# Patient Record
Sex: Male | Born: 1960 | Race: White | Hispanic: No | Marital: Married | Smoking: Current every day smoker
Health system: Southern US, Community
[De-identification: ages and names within clinical notes are randomized; demographics above are authoritative.]

## PROBLEM LIST (undated history)

## (undated) DIAGNOSIS — M1990 Unspecified osteoarthritis, unspecified site: Secondary | ICD-10-CM

## (undated) DIAGNOSIS — G4733 Obstructive sleep apnea (adult) (pediatric): Secondary | ICD-10-CM

## (undated) DIAGNOSIS — F329 Major depressive disorder, single episode, unspecified: Secondary | ICD-10-CM

## (undated) DIAGNOSIS — R9431 Abnormal electrocardiogram [ECG] [EKG]: Secondary | ICD-10-CM

## (undated) DIAGNOSIS — F419 Anxiety disorder, unspecified: Secondary | ICD-10-CM

## (undated) DIAGNOSIS — N138 Other obstructive and reflux uropathy: Secondary | ICD-10-CM

## (undated) DIAGNOSIS — G4700 Insomnia, unspecified: Secondary | ICD-10-CM

## (undated) DIAGNOSIS — E876 Hypokalemia: Secondary | ICD-10-CM

## (undated) DIAGNOSIS — D649 Anemia, unspecified: Secondary | ICD-10-CM

## (undated) DIAGNOSIS — M6282 Rhabdomyolysis: Secondary | ICD-10-CM

## (undated) DIAGNOSIS — E669 Obesity, unspecified: Secondary | ICD-10-CM

## (undated) DIAGNOSIS — R3989 Other symptoms and signs involving the genitourinary system: Secondary | ICD-10-CM

## (undated) DIAGNOSIS — Z8781 Personal history of (healed) traumatic fracture: Secondary | ICD-10-CM

## (undated) DIAGNOSIS — F32A Depression, unspecified: Secondary | ICD-10-CM

## (undated) DIAGNOSIS — E569 Vitamin deficiency, unspecified: Secondary | ICD-10-CM

## (undated) DIAGNOSIS — R0789 Other chest pain: Secondary | ICD-10-CM

## (undated) DIAGNOSIS — J189 Pneumonia, unspecified organism: Secondary | ICD-10-CM

## (undated) DIAGNOSIS — K1120 Sialoadenitis, unspecified: Secondary | ICD-10-CM

## (undated) DIAGNOSIS — N401 Enlarged prostate with lower urinary tract symptoms: Secondary | ICD-10-CM

## (undated) DIAGNOSIS — F319 Bipolar disorder, unspecified: Secondary | ICD-10-CM

## (undated) DIAGNOSIS — F17200 Nicotine dependence, unspecified, uncomplicated: Secondary | ICD-10-CM

## (undated) DIAGNOSIS — S0291XA Unspecified fracture of skull, initial encounter for closed fracture: Secondary | ICD-10-CM

## (undated) DIAGNOSIS — K115 Sialolithiasis: Secondary | ICD-10-CM

## (undated) HISTORY — PX: COLONOSCOPY: SHX174

## (undated) HISTORY — DX: Nicotine dependence, unspecified, uncomplicated: F17.200

## (undated) HISTORY — PX: OTHER SURGICAL HISTORY: SHX169

## (undated) HISTORY — DX: Benign prostatic hyperplasia with lower urinary tract symptoms: N13.8

## (undated) HISTORY — DX: Sialoadenitis, unspecified: K11.20

## (undated) HISTORY — DX: Unspecified osteoarthritis, unspecified site: M19.90

## (undated) HISTORY — PX: GASTRIC BYPASS: SHX52

## (undated) HISTORY — PX: APPENDECTOMY: SHX54

## (undated) HISTORY — DX: Other symptoms and signs involving the genitourinary system: R39.89

## (undated) HISTORY — DX: Bipolar disorder, unspecified: F31.9

## (undated) HISTORY — DX: Insomnia, unspecified: G47.00

## (undated) HISTORY — DX: Hypokalemia: E87.6

## (undated) HISTORY — DX: Other chest pain: R07.89

## (undated) HISTORY — DX: Obesity, unspecified: E66.9

## (undated) HISTORY — DX: Sialolithiasis: K11.5

## (undated) HISTORY — DX: Anemia, unspecified: D64.9

## (undated) HISTORY — DX: Obstructive sleep apnea (adult) (pediatric): G47.33

## (undated) HISTORY — DX: Benign prostatic hyperplasia with lower urinary tract symptoms: N40.1

## (undated) HISTORY — PX: TONSILLECTOMY AND ADENOIDECTOMY: SUR1326

## (undated) HISTORY — DX: Vitamin deficiency, unspecified: E56.9

---

## 1993-09-08 HISTORY — PX: KNEE ARTHROSCOPY: SUR90

## 1998-02-08 ENCOUNTER — Observation Stay (HOSPITAL_COMMUNITY): Admission: RE | Admit: 1998-02-08 | Discharge: 1998-02-09 | Payer: Self-pay | Admitting: Specialist

## 1998-11-30 ENCOUNTER — Encounter: Payer: Self-pay | Admitting: Internal Medicine

## 1998-11-30 ENCOUNTER — Ambulatory Visit (HOSPITAL_COMMUNITY): Admission: RE | Admit: 1998-11-30 | Discharge: 1998-11-30 | Payer: Self-pay | Admitting: Internal Medicine

## 2000-09-15 ENCOUNTER — Encounter: Payer: Self-pay | Admitting: Emergency Medicine

## 2000-09-15 ENCOUNTER — Encounter: Admission: RE | Admit: 2000-09-15 | Discharge: 2000-09-15 | Payer: Self-pay | Admitting: Emergency Medicine

## 2001-02-11 ENCOUNTER — Encounter: Admission: RE | Admit: 2001-02-11 | Discharge: 2001-02-11 | Payer: Self-pay | Admitting: Emergency Medicine

## 2001-02-11 ENCOUNTER — Encounter: Payer: Self-pay | Admitting: Emergency Medicine

## 2001-03-22 ENCOUNTER — Encounter: Payer: Self-pay | Admitting: Emergency Medicine

## 2001-03-22 ENCOUNTER — Encounter: Admission: RE | Admit: 2001-03-22 | Discharge: 2001-03-22 | Payer: Self-pay | Admitting: Emergency Medicine

## 2004-09-08 HISTORY — PX: GASTRIC BYPASS: SHX52

## 2008-03-27 ENCOUNTER — Other Ambulatory Visit (HOSPITAL_COMMUNITY): Admission: RE | Admit: 2008-03-27 | Discharge: 2008-03-27 | Payer: Self-pay | Admitting: Psychiatry

## 2008-04-18 ENCOUNTER — Emergency Department (HOSPITAL_BASED_OUTPATIENT_CLINIC_OR_DEPARTMENT_OTHER): Admission: EM | Admit: 2008-04-18 | Discharge: 2008-04-19 | Payer: Self-pay | Admitting: Emergency Medicine

## 2008-07-05 ENCOUNTER — Inpatient Hospital Stay (HOSPITAL_COMMUNITY): Admission: RE | Admit: 2008-07-05 | Discharge: 2008-07-08 | Payer: Self-pay | Admitting: Specialist

## 2008-08-15 ENCOUNTER — Inpatient Hospital Stay (HOSPITAL_COMMUNITY): Admission: RE | Admit: 2008-08-15 | Discharge: 2008-08-18 | Payer: Self-pay | Admitting: Specialist

## 2008-09-12 ENCOUNTER — Encounter: Admission: RE | Admit: 2008-09-12 | Discharge: 2008-10-04 | Payer: Self-pay | Admitting: Specialist

## 2010-05-14 ENCOUNTER — Emergency Department (HOSPITAL_COMMUNITY): Admission: EM | Admit: 2010-05-14 | Discharge: 2010-05-14 | Payer: Self-pay | Admitting: Emergency Medicine

## 2011-01-21 NOTE — Op Note (Signed)
NAME:  Alvin Allen, Alvin Allen NO.:  [id number]   MEDICAL RECORD NO.:  [id number]          PATIENT TYPE:  INP   LOCATION:  0001                         FACILITY:  [facility]   PHYSICIAN:  Erasmo Leventhal, M.D.DATE OF BIRTH:  1960-12-09   DATE OF PROCEDURE:  07/05/2008  DATE OF DISCHARGE:                               OPERATIVE REPORT   PREOPERATIVE DIAGNOSIS:  Right knee end-stage osteoarthritis.   POSTOPERATIVE DIAGNOSIS:  Right knee end-stage osteoarthritis.   PROCEDURE:  Right total knee arthroplasty.   SURGEON:  Erasmo Leventhal, M.D.   ASSISTANT:  Jaquelyn Bitter. Chabon, P.A.   ANESTHESIA:  Spinal with monitored anesthesia care.   ESTIMATED BLOOD LOSS:  Less than 50 mL.   DRAINS:  One medium Hemovac.   COMPLICATIONS:  None.   TOURNIQUET TIME:  Was 80 minutes at 300 mmHg.   DISPOSITION:  PACU stable.   OPERATIVE DETAILS:  The patient was counseled in the holding area after  correct side was identified.  Taken to the operating room where spinal  anesthetic was administered.  IV antibiotics were given.  Foley catheter  placed utilizing sterile technique by the OR circulating nurse.  All  extremities well-padded and bumped.  Right knee showed a 5 degree  flexion contracture and he could flex to 120 degrees.  He was elevated.  He was prepped with DuraPrep and draped in sterile fashion.  Exsanguinated with the Esmarch and tourniquet was inflated to 300 mmHg.  Straight midline incision made through skin and subcutaneous tissue.  Medial soft tissue flaps were developed at appropriate level.  Medial  parapatellar arthrotomy was performed.  Patella was retracted out of the  way.  Knee was hyperflexed with end-stage arthritis changes.  The  cruciate ligaments were resected.  Starting hole made in distal femur  and canal was irrigated.  The effluent was clear.  Intramedullary rod  was gently placed.  I chose a 5 degree valgus cut and took a 10 mm cut  off distal  femur.  The extremity was found to be a size #5.  Rotation  marks were made and set.  Femoral box cut was applied.  Medial and  lateral menisci removed under direct visualization.  Geniculate vessels  were coagulated.  Posterior neurovascular structures were followed and  protected throughout the entire case.  Again utilizing extramedullary  rod on the tibia it was set at appropriate angle and level in all  planes.  We chose a 10 mm cut off the least deficient side which was the  lateral side at a 0 degree slope.  Proximal tibia was found to be a size  #5.  Posteromedial, posterior femoral osteophytes were removed under  direct visualization.  With flexion/extension blocks the 10 was well-  balanced.   Tibia was exposed.  Guide was applied.  Coverage was set.  Reamer punch  was performed.  Femoral box cut was then performed.  At this time with a  size 5 tibia, size 5 femur, 10 insert and well-balanced in  flexion/extension.  Patella was found to be a size 38 and appropriate  amount of  bone was resected.  Locking holes were made with a patellar  button.  We had anatomic patellofemoral tracking.  All trials removed.  The knee was irrigated with pulsatile lavage.  Utilizing modern cement  technique all components were cemented in place, size 5 tibia, size 5  femur, 10 insert, trial insert and a 38 patella.  Once cement had cured  excess cement was removed.  We did a trial of a 10/12.5 insert.  With  the 12.5 insert we had well-balanced flexion/extension gaps, stable to  varus and valgus stress, patellofemoral tract was anatomic.  Trials were  removed.  The knee was irrigated with pulsatile lavage.  A final 12.5  posterior stabilized rotating platform tibial insert was implanted.  All  components were irrigated again.  A medium Hemovac drain was placed.  A  sequential closure of layers done at 90 degrees of flexion.  Subcu  closed with Vicryl and skin with a subcuticular Monocryl suture.   Steri-  Strips were applied.  Drains hooked to suction.  Sterile dressing was  applied.  Tourniquet was deflated.  The patient tolerated the procedure  well.  No complications or problems.  At this point in time he is taking  from the operating room to the PACU in stable condition.  Sponge and  needle count correct.   To help with surgical technique and decision making Mr. Jodene Nam,  P.A.C. assisted throughout the entire care.           ______________________________  Erasmo Leventhal, M.D.     RAC/MEDQ  D:  07/05/2008  T:  07/05/2008  Job:  [id number]

## 2011-01-21 NOTE — H&P (Signed)
NAME:  Alvin Allen, WAHLSTROM NO.:  [id number]   MEDICAL RECORD NO.:  [id number]          PATIENT TYPE:  INP   LOCATION:                               FACILITY:  [facility]   PHYSICIAN:  Collins                DATE OF BIRTH:  [birth year]   DATE OF ADMISSION:  06/08/2008  DATE OF DISCHARGE:                              HISTORY & PHYSICAL   CHIEF COMPLAINT:  End-stage osteoarthritis right knee.   HISTORY OF PRESENT ILLNESS:  This is a 50 year old gentleman with a  history of end-stage osteoarthritis of his right knee with failure of  conservative treatment to alleviate his pain and discomfort.  After  discussion of treatment benefits, risks and options, and medical  clearance by his medical doctor, surgery is to proceed for total knee  arthroplasty of his knee.  His medical doctor is in [facility], so if we  need medical consultation during hospitalization, we will use [facility]  hospitalists.  His psychiatrist is Dr. Donell Beers here in town.   PAST MEDICAL HISTORY:  Drug allergy to TERGITOL.   CURRENT MEDICATIONS:  1. Seroquel 100 mg q.i.d.  2. Abilify 10 mg daily.  3. Chantix 1 mg b.i.d.  4. Inderal 10 mg daily.  5. Ativan 0.5 mg b.i.d. p.r.n.  6. Lunesta 3 mg or Ambien 10 mg nightly p.r.n.  7. Lamictal 200 mg nightly.   SERIOUS MEDICAL ILLNESSES:  Include bipolar disease and previous  surgeries include bariatric surgery and left knee arthroscopic surgery.   FAMILY HISTORY:  Negative.   SOCIAL HISTORY:  The patient is married, works as an Clinical cytogeneticist.  He  smokes 1 pack per day and drinks about 2 drinks per day.   REVIEW OF SYSTEMS:  CENTRAL NERVOUS SYSTEM:  Positive for bipolar and  anxiety.  PULMONARY:  Positive for history of sleep apnea when the  patient was obese but he is now off of the CPAP machine, and does not  use it any more.  He has a history of asthma.  CARDIOVASCULAR:  Negative  for chest pain, palpitation.  GI: Negative for ulcers, hepatitis.  Positive  for bariatric surgery.  GU: Positive for enlarged prostate.  MUSCULOSKELETAL:  Positive in HPI.   PHYSICAL EXAM:  VITAL SIGNS:  BP 130/88, respirations 16, pulse 68 and  regular.  GENERAL APPEARANCE:  This well-developed, well-nourished gentleman in no  acute distress.  HEENT:  Head normocephalic.  Nose patent.  Ears patent.  Pupils equal,  round, react to light.  Throat without injection.  NECK:  Supple without adenopathy.  Carotids 2+ without bruit.  CHEST:  Clear to auscultation.  No rales or rhonchi.  Respirations 16.  HEART:  Regular rate and rhythm at 68 beats per without murmur.  ABDOMEN:  Soft with active bowel sounds.  No masses, organomegaly.  NEUROLOGIC:  Patient alert and oriented to time, place and person.  Cranial nerves II-XII grossly intact.  EXTREMITIES:  Shows the right knee with a varus deformity.  Neurovascular status intact.  Dorsalis pedis and posterior tibialis  pulses are 2+.  He has 5 degrees flexion contracture with further  flexion to 120 degrees.   X-rays show end-stage osteoarthritis right knee.   IMPRESSION:  End-stage osteoarthritis right knee.   PLAN:  Total knee arthroplasty right knee.      Jaquelyn Bitter. Chabon, P.A.    ______________________________  Thomasena Edis    SJC/MEDQ  D:  05/29/2008  T:  05/30/2008  Job:  [id number]

## 2011-01-21 NOTE — Discharge Summary (Signed)
NAME:  Alvin Allen, Alvin Allen NO.:  [id number]   MEDICAL RECORD NO.:  [id number]          PATIENT TYPE:  INP   LOCATION:  1611                         FACILITY:  [facility]   PHYSICIAN:  Erasmo Leventhal, M.D.DATE OF BIRTH:  1960-12-02   DATE OF ADMISSION:  07/05/2008  DATE OF DISCHARGE:  07/08/2008                               DISCHARGE SUMMARY   ADMITTING DIAGNOSIS:  End-stage osteoarthritis, right knee.   DISCHARGE DIAGNOSIS:  Same.   OPERATION:  Total knee arthroplasty, right knee.   BRIEF HISTORY:  This is a 50 year old gentleman with history of end-  stage osteoarthritis of bilateral knees, right greater left, who is now  undergoing total knee arthroplasty of his right knee.  The surgery  risks, benefits and aftercare were discussed in detail with the patient,  questions invited and answered.  He has had medical clearance by his  medical doctor and psychiatrist and surgical will go ahead as scheduled.   LABORATORY VALUES:  Admission CBC showed the RBCs slightly low at 4.1,  MCV slightly high at 101.4, hemoglobin/hematocrit reached a low of 11.4  and 33 on October 31, PT/PTT within normal, his admission CMET within  normal limits, had a slightly low potassium on October 31 at 3.2.  Glucose was high on the 29th at 121, otherwise normal.  Urinalysis  within normal limits.  EKG showed sinus bradycardia and an incomplete  right bundle branch block.   COURSE IN THE HOSPITAL:  The patient tolerated the operative procedure  well.  First postoperative day vital signs stable, temperature to 100, I  and Os good, hemoglobin and hematocrit stable, lungs clear, heart sounds  normal, bowel sounds active, calves were negative.  Drain was removed  without difficulty.  Physical therapy and CPM were started.  Second  postoperative day vital signs stable, temperature to a max of 99.5, O2  98 on room air, hemoglobin 11.7, hematocrit 33.8, lungs clear, heart  sounds normal, bowel  sounds sluggish, calves negative and wound was  benign and plans were made for discharge in the a.m. as labs and patient  doing well.  Third postoperative day vital signs were stable, he was  afebrile, hemoglobin of 11.4, lungs clear, heart sounds normal, dressing  was changed, wound was benign.  The patient subsequently discharged home  for follow-up in the office.   CONDITION ON DISCHARGE:  Improved.   DISCHARGE MEDICATIONS:  1. Oxycodone 5 mg one to two q.6 h p.r.n. pain.  2. Robaxin 500 one p.o. q.8 h p.r.n. spasm.  3. Lovenox 30 mg one shot each day at 7:00 a.m. and 7:00 p.m., when      Lovenox runs out aspirin 81 mg one a day for 3 weeks.   DISCHARGE INSTRUCTIONS:  He is to use his crutches, do his therapy,  weightbear as tolerated with his brace.  Return to the office in 2 weeks  for recheck or sooner p.r.n. problem.   DISCHARGE DIET:  Regular diet.      Jaquelyn Bitter. Chabon, P.A.    ______________________________  Erasmo Leventhal, M.D.    SJC/MEDQ  D:  08/09/2008  T:  08/09/2008  Job:  [id number]

## 2011-01-21 NOTE — Discharge Summary (Signed)
NAME:  JAMESON, TORMEY NO.:  [id number]   MEDICAL RECORD NO.:  [id number]          PATIENT TYPE:  INP   LOCATION:  1606                         FACILITY:  [facility]   PHYSICIAN:  Erasmo Leventhal, M.D.DATE OF BIRTH:  08-27-1961   DATE OF ADMISSION:  08/15/2008  DATE OF DISCHARGE:  08/18/2008                               DISCHARGE SUMMARY   ADMISSION DIAGNOSIS:  End-stage osteoarthritis left knee.   DISCHARGE DIAGNOSIS:  Same.   OPERATION:  Total knee arthroplasty left knee.   BRIEF HISTORY:  This is a 50 year old gentleman with history of obesity  in the past with massive weight loss with bariatric surgery who had end-  stage osteoarthritis of both knees.  He initially had the right knee  done several months ago and did well and now in for total knee  arthroplasty of the left knee due to failure of conservative treatment  to alleviate his pain and difficulties.  The surgery risks, benefits and  aftercare were again discussed in detail with the patient, questions  invited and answered.  Surgery will go ahead as scheduled.   LABORATORY VALUES:  Admission CBC within normal limits with exception of  slightly high neutrophils at 79.  Admission PT/PTT within normal limits.  Admission urinalysis within normal limits.  Admission CMET within normal  limits.  Hemoglobin/hematocrit reached a low of 11.2 and 33.5 on the  11th.  BMET showed mildly elevated glucose in the 118 - 108 range  through admission.  Potassium on the 11th - 3.4.   COURSE IN THE HOSPITAL:  The patient tolerated the operative procedure  well.  First postoperative day vital signs were stable, he is afebrile  though temperature to a max through the night of 100.6, lungs clear,  heart sounds normal, bowel sounds active, calves negative, dressing was  dry, drain was removed and he was started in therapy.  Second  postoperative day vital signs were stable, was afebrile, I and Os were  good, lungs clear,  heart sounds normal, bowel sounds active, calves  negative, dressing was changed, wound benign and plans were made for  discharge on Friday.  Friday his vital signs stable, he was afebrile, O2  - 98 on room air, hemoglobin 11.2, hematocrit 33.5, lungs clear, heart  sounds normal, bowel sounds active, calves negative, wound benign.  The  patient was subsequently discharged home for follow-up in the office.   CONDITION ON DISCHARGE:  Improved.   DISCHARGE MEDICATIONS:  1. Oxycodone 5 mg one to two q.4 - 6 hours p.r.n. pain.  2. Robaxin 500 one p.o. q.8 hours p.r.n. spasm.  3. Lovenox 30 mg one shot at 7:00 a.m. and 7:00 p.m. for another 10      days and then switch to 181 mg aspirin per day for 3 weeks.  4. Iron 325 mg one p.o. b.i.d.   DISCHARGE INSTRUCTIONS:  He is keep the wound clean and dry for 3 weeks.  He is to do his home therapy, call the office today for follow-up in 2  weeks or call sooner p.r.n. problems.   DIET:  Regular diet.  Jaquelyn Bitter. Chabon, P.A.    ______________________________  Erasmo Leventhal, M.D.    SJC/MEDQ  D:  08/18/2008  T:  08/18/2008  Job:  [id number]

## 2011-01-21 NOTE — Op Note (Signed)
NAME:  Alvin Allen, Alvin Allen NO.:  [id number]   MEDICAL RECORD NO.:  [id number]          PATIENT TYPE:  INP   LOCATION:  0002                         FACILITY:  [facility]   PHYSICIAN:  Erasmo Leventhal, M.D.DATE OF BIRTH:  Dec 12, 1960   DATE OF PROCEDURE:  08/15/2008  DATE OF DISCHARGE:                               OPERATIVE REPORT   PREOPERATIVE DIAGNOSES:  Left knee end-stage arthritis.   POSTOPERATIVE DIAGNOSES:  Left knee end-stage arthritis.   PROCEDURE:  Left total knee arthroplasty.   SURGEON:  Erasmo Leventhal, M.D.   ASSISTANT:  Jaquelyn Bitter. Chabon, P.A.   ANESTHESIA:  Spinal.   BLOOD LOSS:  Less than 100 mL.   DRAINS:  One Hemovac.   COMPLICATIONS:  None.   TOURNIQUET TIME:  85 minutes at 300 mmHg.   DISPOSITION:  PACU stable.   OPERATIVE IMPLANTS:  [employer].  Femur size 5, tibia  size 5, patella 38.  The 10 mm posterior stabilized rotating platform  insert.  All cemented.   DESCRIPTION OF PROCEDURE:  The patient was counseled in the holding area  and correct side identified.  IV started and skin block administered.  Taken to operating room, placed in a supine position.  Then spinal  anesthetic was administered.  Foley catheter placed utilizing sterile  technique by OR circulating nurse after IV antibiotics were given.  All  extremities were well padded and bumped.  The left knee was examined, 5  degree flexion contraction, flexion to 120 degrees.  He was elevated,  prepped with DuraPrep and draped in a sterile fashion, exsanguinated  with Esmarch and tourniquet inflated to 300 mmHg.  Straight midline  incision made in the skin and subcutaneous tissue.  Medial soft tissue  flaps developed.  Medial parapatellar arthrotomy was performed.  Soft  tissue release was done.  There was bone against bone contact of all  compartments.  The cruciate ligament was resected.  At start hole made  in the distal femur, canal was  irrigated, inflow was clear.  Intramedullary rod was gently placed.  Took a 10-mm cup distal femur and  5 degrees valgus cut.  This was femur was found to be a size #5.  Cutting block was applied, cut to fit a size five.  The medial and  lateral menisci removed under direct visualization.  Geniculate vessels  were coagulated.  Posterior neurovascular structures were thought of and  protected throughout the entire case.  Extramedullary tibial alignment  rod device was applied for a 0 degree slope and took a 10 mm cuff off  the proximal tibia.  Posteromedial and posterior femoral osteophytes  removed.  For a 10 insert it seemed to be well balanced in flexion and  extension.  Tibial baseplate was applied, size 5 reamer and punch  performed.  Femoral box cut was now performed. At this point in time  with a 10 insert trial, we were tight in flexion.  Femoral trial was  removed and another 2 mm were taken off the distal femur.  At this point  in time we were again tight  in both flexion and extension.  Another 2 mm  was taken off the proximal tibia.  At this point in time with a size  five femur, size five tibia, 10 insert we were well-balanced in  flexion/extension, stable to varus-valgus stress.  We also did a soft  tissue release for a tight meatal contracture.  The patella was found to  be a size 38.  Appropriate amount of bone was resected.  A lug hole was  made and patellar button tracked anatomically.  All trials removed.  Knee was irrigated with pulsatile lavage.  All components were cemented  into place.  A size five femur, size five tibia, 38 patella with a 10  trial insert.  After cement had dried excess cement was removed with 10  insert well-balanced flexion/extension, stable to varus-valgus stress, 0  to 90 degrees of flexion.  Patellar track was anatomic.  Trial was  removed.  Knee was irrigated.  Excess cement was removed again and a  final 10 mm posterior stabilized rotating  platform tibial insert was  implanted.  We now checked the knee.  We were well aligned and were well  aligned in valgus.  We were stable to varus and valgus stress, 0 to 90  degrees flexion.  Patella tracked anatomically.  A drain was placed.  A  sequential closure in layers done. The arthrotomy was closed at 90  degrees of flexion, subcu Vicryl, skin with a subcu Monocryl suture.  Steri-Strips applied.  Drains hooked to suction.  Sterile dressing  applied.  Tourniquet deflated.  Normal circulation foot and ankle at the  end of the case.  He was then awakened, taken from operating room in  stable condition.  Sponge and needle count correct.  No complications or  problems.  Help with the surgical technique and decision making with Mr.  Jodene Nam PA-C assisted in the entire case.           ______________________________  Erasmo Leventhal, M.D.     RAC/MEDQ  D:  08/15/2008  T:  08/15/2008  Job:  [id number]

## 2011-01-24 NOTE — H&P (Signed)
NAME:  Alvin Allen, Alvin Allen Allen NO.:  [id number]   MEDICAL RECORD NO.:  [id number]          PATIENT TYPE:  INP   LOCATION:                               [facility]ACILITY:  [facility]   PHYSICIAN:  Erasmo Leventhal, M.D.DATE OF BIRTH:  [birth year]   DATE OF ADMISSION:  08/15/2008  DATE OF DISCHARGE:                              HISTORY & PHYSICAL   DATE OF SURGERY:  August 15, 2008.   CHIEF COMPLAINT:  Left knee osteoarthritis.   HISTORY OF PRESENT ILLNESS:  This is a 50 year old gentleman with a  history of bilateral end-stage osteoarthritis who underwent total knee  arthroplasty of his right knee about 3 weeks ago and is doing quite  well.  He is now scheduled for total knee arthroplasty of his left knee  on December 8.  The surgeries, benefits and aftercare were discussed  again in detail with the patient, questions invited and answered and  surgery to go ahead as scheduled.   PAST MEDICAL HISTORY:  DRUG ALLERGY TEGRETOL.   CURRENT MEDICATIONS:  Abilify, Chantix, Lamictal, Seroquel, Ativan and  Wellbutrin.   CURRENT DOSING SCHEDULE:  1. Abilify 10 mg q.a.m.  2. Chantix 1 mg b.i.d.  3. Celebrex 200 mg 1 daily.  4. Inderal 10 mg b.i.d.  5. Lamictal200 mg 1 nightly.  6. Lunesta 3 mg one nightly.  7. Seroquel 300 mg nightly.  8. Ativan 0.5 mg b.i.d. p.r.n.   PREVIOUS SURGERIES:  Include bariatric surgery, left knee arthroscopy  and right total knee arthroplasty.   SERIOUS MEDICAL ILLNESSES:  Include bipolar disease.   FAMILY HISTORY:  Negative.   SOCIAL HISTORY:  The patient is married.  He is in the process of  quitting smoking.  He drinks two drinks per day.   REVIEW OF SYSTEMS:  CENTRAL NERVOUS SYSTEM:  Positive for bipolar  disease and anxiety.  PULMONARY:  Positive for history of sleep apnea  and asthma.  He no longer uses CPAP since his bariatric surgery and  weight loss.  CARDIOVASCULAR:  Negative for chest pain, palpitations.  GI: Positive for history of  bariatric surgery.  GU: Positive for BPH.   PHYSICAL EXAM:  BP 122/80, respirations 16, pulse 74 regular.  GENERAL APPEARANCE:  This is a well-developed, well-nourished gentleman  in no acute distress.  HEENT:  Head normocephalic.  Nose patent.  Ears patent.  Pupils equal,  round and reactive to light.  Throat without injection.  NECK:  Supple without adenopathy.  Carotids 2+ without bruit.  CHEST:  Clear to auscultation.  No rales or rhonchi.  Respirations 16.  HEART:  Regular rate and  rhythm at 74 beats per minute without murmur.  ABDOMEN:  Soft with active bowel sounds.  No masses, organomegaly.  NEUROLOGIC:  Patient alert and oriented to time, place and person.  Cranial nerves II-XII grossly intact.  EXTREMITIES:  Show the right knee status post total knee arthroplasty  with a well-healed scar. 0-115 degree range of motion with excellent  stability.  Left knee shows 5 degree flexion contracture with further  flexion to 135 degrees, varus deformity.  Neurovascular status  intact.  Dorsalis pedis and  posterior tibialis pulses are 2+.   X-rays show end-stage osteoarthritis of the left knee.   IMPRESSION:  End-stage osteoarthritis left knee.   PLAN:  Total knee arthroplasty left knee,      Jaquelyn Bitter. Chabon, P.A.    ______________________________  Erasmo Leventhal, M.D.    SJC/MEDQ  D:  07/26/2008  T:  07/26/2008  Job:  [id number]

## 2011-06-06 LAB — POCT CARDIAC MARKERS
CKMB, poc: 3
Myoglobin, poc: 62.9
Myoglobin, poc: 80

## 2011-06-10 LAB — CBC
HCT: 33 — ABNORMAL LOW
HCT: 33.8 — ABNORMAL LOW
Hemoglobin: 11.7 — ABNORMAL LOW
Hemoglobin: 12 — ABNORMAL LOW
Hemoglobin: 13.8
MCHC: 33.2
MCHC: 34.7
MCV: 99.5
Platelets: 183
RBC: 3.48 — ABNORMAL LOW
RBC: 4.1 — ABNORMAL LOW
RDW: 13.3
RDW: 13.6
WBC: 6.3

## 2011-06-10 LAB — BASIC METABOLIC PANEL
BUN: 7
CO2: 27
CO2: 29
Calcium: 8.1 — ABNORMAL LOW
Calcium: 8.1 — ABNORMAL LOW
Chloride: 106
GFR calc Af Amer: >60
GFR calc non Af Amer: >60
GFR calc non Af Amer: >60
Glucose, Bld: 91
Glucose, Bld: 97
Potassium: 3.2 — ABNORMAL LOW
Potassium: 3.5
Potassium: 3.7
Sodium: 136
Sodium: 140

## 2011-06-10 LAB — DIFFERENTIAL
Basophils Absolute: 0
Basophils Relative: 0
Eosinophils Absolute: 0.3
Eosinophils Relative: 5
Lymphs Abs: 2
Neutrophils Relative %: 57

## 2011-06-10 LAB — CROSSMATCH: Antibody Screen: NEGATIVE

## 2011-06-10 LAB — COMPREHENSIVE METABOLIC PANEL
ALT: 22
AST: 19
Alkaline Phosphatase: 58
CO2: 30
Chloride: 108
GFR calc Af Amer: >60
GFR calc non Af Amer: >60
Glucose, Bld: 85
Potassium: 3.9
Sodium: 142
Total Bilirubin: 0.7

## 2011-06-10 LAB — URINALYSIS, ROUTINE W REFLEX MICROSCOPIC
Bilirubin Urine: NEGATIVE
Glucose, UA: NEGATIVE
Nitrite: NEGATIVE
Specific Gravity, Urine: 1.021
pH: 5.5

## 2011-06-10 LAB — PROTIME-INR
INR: 1
Prothrombin Time: 13.7

## 2011-06-13 LAB — CBC
Hemoglobin: 11.2 g/dL — ABNORMAL LOW (ref 13.0–17.0)
Hemoglobin: 11.7 g/dL — ABNORMAL LOW (ref 13.0–17.0)
Hemoglobin: 14.2 g/dL (ref 13.0–17.0)
MCHC: 33.4 g/dL (ref 30.0–36.0)
MCHC: 33.5 g/dL (ref 30.0–36.0)
MCV: 100.3 fL — ABNORMAL HIGH (ref 78.0–100.0)
MCV: 98.7 fL (ref 78.0–100.0)
MCV: 99.4 fL (ref 78.0–100.0)
RBC: 3.52 MIL/uL — ABNORMAL LOW (ref 4.22–5.81)
RBC: 3.52 MIL/uL — ABNORMAL LOW (ref 4.22–5.81)
RDW: 12.6 % (ref 11.5–15.5)
RDW: 13.3 % (ref 11.5–15.5)
WBC: 6.4 10*3/uL (ref 4.0–10.5)

## 2011-06-13 LAB — BASIC METABOLIC PANEL
CO2: 27 mEq/L (ref 19–32)
CO2: 29 mEq/L (ref 19–32)
Calcium: 8.3 mg/dL — ABNORMAL LOW (ref 8.4–10.5)
Calcium: 8.5 mg/dL (ref 8.4–10.5)
Chloride: 101 mEq/L (ref 96–112)
Chloride: 104 mEq/L (ref 96–112)
Chloride: 99 mEq/L (ref 96–112)
Creatinine, Ser: 0.93 mg/dL (ref 0.4–1.5)
GFR calc Af Amer: >60 mL/min (ref >60)
GFR calc Af Amer: >60 mL/min (ref >60)
GFR calc non Af Amer: >60 mL/min (ref >60)
Glucose, Bld: 95 mg/dL (ref 70–99)
Potassium: 3.9 mEq/L (ref 3.5–5.1)
Sodium: 134 mEq/L — ABNORMAL LOW (ref 135–145)
Sodium: 140 mEq/L (ref 135–145)

## 2011-06-13 LAB — COMPREHENSIVE METABOLIC PANEL
ALT: 17 U/L (ref 0–53)
AST: 14 U/L (ref 0–37)
Albumin: 3.7 g/dL (ref 3.5–5.2)
Alkaline Phosphatase: 56 U/L (ref 39–117)
Glucose, Bld: 91 mg/dL (ref 70–99)
Potassium: 3.7 mEq/L (ref 3.5–5.1)
Sodium: 142 mEq/L (ref 135–145)
Total Protein: 6.3 g/dL (ref 6.0–8.3)

## 2011-06-13 LAB — URINALYSIS, ROUTINE W REFLEX MICROSCOPIC
Bilirubin Urine: NEGATIVE
Hgb urine dipstick: NEGATIVE
Specific Gravity, Urine: 1.028 (ref 1.005–1.030)
pH: 6 (ref 5.0–8.0)

## 2011-06-13 LAB — DIFFERENTIAL
Basophils Relative: 0 % (ref 0–1)
Eosinophils Absolute: 0.2 10*3/uL (ref 0.0–0.7)
Eosinophils Relative: 2 % (ref 0–5)
Monocytes Absolute: 0.5 10*3/uL (ref 0.1–1.0)
Monocytes Relative: 5 % (ref 3–12)
Neutrophils Relative %: 79 % — ABNORMAL HIGH (ref 43–77)

## 2011-06-13 LAB — CROSSMATCH: Antibody Screen: NEGATIVE

## 2011-06-19 ENCOUNTER — Ambulatory Visit (HOSPITAL_COMMUNITY): Payer: Self-pay | Admitting: Physician Assistant

## 2011-08-27 ENCOUNTER — Encounter: Payer: Self-pay | Admitting: Pulmonary Disease

## 2011-08-28 ENCOUNTER — Encounter: Payer: Self-pay | Admitting: Pulmonary Disease

## 2011-08-28 ENCOUNTER — Ambulatory Visit (INDEPENDENT_AMBULATORY_CARE_PROVIDER_SITE_OTHER): Payer: BC Managed Care – PPO | Admitting: Pulmonary Disease

## 2011-08-28 VITALS — BP 104/78 | HR 71 | Temp 99.3°F | Ht 70.0 in | Wt 233.0 lb

## 2011-08-28 DIAGNOSIS — G4733 Obstructive sleep apnea (adult) (pediatric): Secondary | ICD-10-CM

## 2011-08-28 DIAGNOSIS — G4700 Insomnia, unspecified: Secondary | ICD-10-CM

## 2011-08-28 NOTE — Assessment & Plan Note (Signed)
Proceed with in lab PSG to further define degree of sleep disordered breathing or limb movement abnormalities. Would be interesting to see if his obstructive sleep apnea has indeed resolved post bariatric surgery - I would not be surprised to see mild- moderate degree of obstructive sleep apnea since he has regained some of his weight

## 2011-08-28 NOTE — Patient Instructions (Addendum)
Sleep study for residual obstructive sleep apnea - on current medicatons Stop klonopin Taper neurontin to off Cut down trazodone like we discussed 30 mins light exercise  Daily Smoking cessation No nightcaps

## 2011-08-28 NOTE — Progress Notes (Signed)
  Subjective:    Patient ID: Alvin Allen, male    DOB: 10/10/1960, 50 y.o.   MRN: [id number]  HPI  50/M, smoker for evlauation of excessive daytime somnolence  He was diagnosed with obstructive sleep apnea by an overnight polysomnogram in 2004 when he weighed about 480 pounds. 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 He has bipolar disorder and with seasonal mood changes, bordering on mania in spring and summer with severe insomnia. Insomnia is mild in the winter. He sees Dr. Donell Beers . He is maintained on a regimen of valproic, Lamictal and trazodone. Klonopin is being tapered off. He has not used Lunesta in 2 weeks. Trazodone is very helpful-he takes 200 mg at bedtime and then another 200 mg if he wakes up in 3 hours. He's not sure why he is on Neurontin. Bedtime is 8 to 9 PM, sleep latency is 20-30 minutes longer in the summer, he is 2-3 awakenings and frequent arousals, he is out of bed by 5:30 AM feeling tired. He naps about an hour every afternoon. He smokes about three quarters of a pack per day, drinks 5 cups of coffee daily his last cup is about 2-3 hours before bedtime. He has experimented with drinking beer at bedtime. Wife has noted soft snoring but not witnessed apneas. He had UPPP years ago and self discontinued his CPAP after his weight loss. ESS is 4/24. He is allergic to lithium and Tegretol There is no history suggestive of cataplexy, sleep paralysis or parasomnias   Review of Systems  Constitutional: Negative for fever, appetite change and unexpected weight change.  HENT: Positive for rhinorrhea and sneezing. Negative for ear pain, congestion, sore throat, trouble swallowing, dental problem and postnasal drip.   Eyes: Negative for redness.  Respiratory: Negative for cough, shortness of breath and wheezing.   Cardiovascular: Negative for chest pain, palpitations and leg swelling.  Gastrointestinal: Positive for  diarrhea. Negative for nausea, vomiting and abdominal pain.  Genitourinary: Negative for dysuria and urgency.  Musculoskeletal: Negative for joint swelling.  Skin: Negative for rash.  Neurological: Positive for headaches. Negative for syncope.  Hematological: Does not bruise/bleed easily.  Psychiatric/Behavioral: Positive for dysphoric mood. The patient is not nervous/anxious.        Objective:   Physical Exam  Gen. Pleasant, obese, in no distress, normal affect ENT - no lesions, no post nasal drip, class 2-3 airway Neck: No JVD, no thyromegaly, no carotid bruits Lungs: no use of accessory muscles, no dullness to percussion, decreased without rales or rhonchi  Cardiovascular: Rhythm regular, heart sounds  normal, no murmurs or gallops, no peripheral edema Abdomen: soft and non-tender, no hepatosplenomegaly, BS normal. Musculoskeletal: No deformities, no cyanosis or clubbing Neuro:  alert, non focal, no tremors       Assessment & Plan:

## 2011-08-28 NOTE — Assessment & Plan Note (Signed)
Treatment of his insomnia would be tied in to the treatment of his bipolar disorder. His insomnia seems to fluctuate with the seasons & with his mood changes. All the medications seem to have sedative side effetcs. Stop klonopin Taper neurontin to off Cut down trazodone to about 300 mg qhs  30 mins light exercise  Daily Smoking cessation No alcohol at bedtime- discussed sleep disruption with alcohol

## 2011-09-26 ENCOUNTER — Ambulatory Visit (HOSPITAL_BASED_OUTPATIENT_CLINIC_OR_DEPARTMENT_OTHER): Payer: BC Managed Care – PPO | Attending: Pulmonary Disease

## 2011-09-26 VITALS — Ht 70.0 in | Wt 240.0 lb

## 2011-09-26 DIAGNOSIS — Z79899 Other long term (current) drug therapy: Secondary | ICD-10-CM | POA: Insufficient documentation

## 2011-09-26 DIAGNOSIS — R404 Transient alteration of awareness: Secondary | ICD-10-CM | POA: Insufficient documentation

## 2011-09-26 DIAGNOSIS — R0609 Other forms of dyspnea: Secondary | ICD-10-CM | POA: Insufficient documentation

## 2011-09-26 DIAGNOSIS — R0989 Other specified symptoms and signs involving the circulatory and respiratory systems: Secondary | ICD-10-CM | POA: Insufficient documentation

## 2011-09-26 DIAGNOSIS — G4733 Obstructive sleep apnea (adult) (pediatric): Secondary | ICD-10-CM

## 2011-10-15 DIAGNOSIS — R404 Transient alteration of awareness: Secondary | ICD-10-CM

## 2011-10-15 DIAGNOSIS — R0989 Other specified symptoms and signs involving the circulatory and respiratory systems: Secondary | ICD-10-CM

## 2011-10-15 DIAGNOSIS — Z79899 Other long term (current) drug therapy: Secondary | ICD-10-CM

## 2011-10-15 DIAGNOSIS — R0609 Other forms of dyspnea: Secondary | ICD-10-CM

## 2011-10-16 NOTE — Procedures (Signed)
NAME:  Alvin Allen, Alvin Allen NO.:  [id number]  MEDICAL RECORD NO.:  [id number]          PATIENT TYPE:  OUT  LOCATION:  SLEEP CENTER                 FACILITY:  [facility]  PHYSICIAN:  Oretha Milch, MD      DATE OF BIRTH:  28-Aug-1961  DATE OF STUDY:  09/26/2011                           NOCTURNAL POLYSOMNOGRAM  REFERRING PHYSICIAN:  Oretha Milch, MD  INDICATION FOR STUDY:  History of grinding was diagnosed with obstructive sleep apnea based on a polysomnogram done in 2004 at [facility] and presents with excessive daytime somnolence and snoring.  At the time of this study, he weighed 240 pounds with a height of 70 inches, BMI of 34, and neck size of 17.5 inches.  EPWORTH SLEEPINESS SCORE:  4.  MEDICATIONS:  Include Depakote, lamotrigine, gabapentin, trazodone. Bedtime medications included 100 mg of trazodone.  This nocturnal polysomnogram was performed with sleep technologist in attendance.  EEG, EOG, EMG, EKG, and respiratory parameters were recorded.  Sleep stages, arousals, limb movements, and respiratory data were scored according to criteria laid out by the American Academy of Sleep Medicine.  SLEEP ARCHITECTURE:  Lights out was at 9:58 p.m.  Lights on was at 4:27 a.m.  Total sleep time was 315 minutes with sleep period time of 333 minutes.  Sleep latency was 21 minutes.  Latency to REM sleep was 84 minutes.  Sleep efficiency was 81%.  Sleep stages as a percentage of total sleep time was N1 15%, N2 76%, N3 0%, and REM sleep 90% (28 minutes).  Supine sleep accounted for 0.5 minutes.  No supine REM sleep was noted.  Longest period of sleep was around 3 a.m.  AROUSAL DATA:  There were total of 23 arousals with an arousal index of 4 events per hour, most of these were spontaneous.  RESPIRATORY DATA:  There were no apneas or hypopneas or RERAs noted during the study.  OXYGEN DATA:  Desaturation index was 0.6 events per hour.  He spent 0 minutes with a  saturation less than 88%.  CARDIAC DATA:  The low heart rate was 39 beats per minute, the high heart rate recorded was an artifact.  MOVEMENT-PARASOMNIA:  Four limb movements were noted with a limb movement index of 0.8 events per hour.  These were not associated with arousals.  DISCUSSION:  Note that minimal supine sleep and no supine REM sleep was noted.  IMPRESSIONS: 1. No evidence of sleep-disordered breathing. 2. No evidence of cardiac arrhythmias, limb movements, or behavioral     disturbance during sleep. 3. The cause of his daytime somnolence could be related to     medications.  RECOMMENDATIONS: 1. Rules of sleep hygiene can be discussed. If excessive sleepiness remains an     issue, doses of medications can be cut down. 2. He should be cautioned against driving when sleepy.     Oretha Milch, MD    RVA/MEDQ  D:  10/15/2011 14:21:29  T:  10/16/2011 07:13:39  Job:  [id number]

## 2011-11-03 ENCOUNTER — Ambulatory Visit (INDEPENDENT_AMBULATORY_CARE_PROVIDER_SITE_OTHER): Payer: BC Managed Care – PPO | Admitting: Pulmonary Disease

## 2011-11-03 ENCOUNTER — Encounter: Payer: Self-pay | Admitting: Pulmonary Disease

## 2011-11-03 DIAGNOSIS — G4733 Obstructive sleep apnea (adult) (pediatric): Secondary | ICD-10-CM

## 2011-11-03 DIAGNOSIS — G4700 Insomnia, unspecified: Secondary | ICD-10-CM

## 2011-11-03 NOTE — Progress Notes (Signed)
  Subjective:    Patient ID: Alvin Allen, male    DOB: November 11, 1960, 51 y.o.   MRN: [id number]  HPI [facility]  50/M, smoker for FU of excessive daytime somnolence  He was diagnosed with obstructive sleep apnea by an overnight polysomnogram in 2004 when he weighed about 480 pounds. 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 He has bipolar disorder and with seasonal mood changes, bordering on mania in spring and summer with severe insomnia. Insomnia is mild in the winter. He sees Dr. Donell Beers . He is maintained on a regimen of valproic, Lamictal and trazodone. Klonopin is being tapered off. He has not used Lunesta in 2 weeks. Trazodone is very helpful-he takes 200 mg at bedtime and then another 200 mg if he wakes up in 3 hours. He's not sure why he is on Neurontin.  Bedtime is 8 to 9 PM, sleep latency is 20-30 minutes longer in the summer, he is 2-3 awakenings and frequent arousals, he is out of bed by 5:30 AM feeling tired. He naps about an hour every afternoon.  He smokes about three quarters of a pack per day, drinks 5 cups of coffee daily his last cup is about 2-3 hours before bedtime. He has experimented with drinking beer at bedtime.  Wife has noted soft snoring but not witnessed apneas. He had UPPP years ago and self discontinued his CPAP after his weight loss. ESS is 4/24.  He is allergic to lithium and Tegretol    11/03/2011 Discussed sleep study findings. He has been able to taper neurontin, cut down trazodone & stopped klonopin completely PSG  (wt 236) >> no OSA    Review of Systems Patient denies significant dyspnea,cough, hemoptysis,  chest pain, palpitations, pedal edema, orthopnea, paroxysmal nocturnal dyspnea, lightheadedness, nausea, vomiting, abdominal or  leg pains      Objective:   Physical Exam  Gen. Pleasant, well-nourished, in no distress ENT - no lesions, no post nasal drip Neck: No JVD, no  thyromegaly, no carotid bruits Lungs: no use of accessory muscles, no dullness to percussion, clear without rales or rhonchi  Cardiovascular: Rhythm regular, heart sounds  normal, no murmurs or gallops, no peripheral edema Musculoskeletal: No deformities, no cyanosis or clubbing         Assessment & Plan:

## 2011-11-03 NOTE — Patient Instructions (Addendum)
You do not have obstructive sleep apnea  Stay on 200 mg of trazodone, If insomnia returns , can increase trazodone back again Ask your psychiatrist if you still need Neurontin Your sleepiness is related to your medications

## 2011-11-04 NOTE — Assessment & Plan Note (Signed)
Wt loss seems to have really helped No intervention requireed

## 2011-11-04 NOTE — Assessment & Plan Note (Addendum)
Treatment of his insomnia would be tied in to the treatment of his bipolar disorder. His insomnia seems to fluctuate with the seasons & with his mood changes. All the medications seem to have sedative side effetcs. Stopped klonopin Taper neurontin to off -- if OK with psych Cut down trazodone to about 300 mg qhs  - he may need increase again during manic episodes 30 mins light exercise  Daily Smoking cessation No alcohol at bedtime- discussed sleep disruption with alcohol Further FU will be with his PCP

## 2015-04-20 ENCOUNTER — Other Ambulatory Visit (HOSPITAL_COMMUNITY): Payer: Self-pay | Admitting: Psychiatry

## 2015-05-09 ENCOUNTER — Other Ambulatory Visit (HOSPITAL_COMMUNITY): Payer: Self-pay | Admitting: Psychiatry

## 2015-05-17 ENCOUNTER — Emergency Department (HOSPITAL_COMMUNITY): Payer: BC Managed Care – PPO

## 2015-05-17 ENCOUNTER — Inpatient Hospital Stay (HOSPITAL_COMMUNITY)
Admission: EM | Admit: 2015-05-17 | Discharge: 2015-05-19 | DRG: 565 | Disposition: A | Discharge status: Home / Self Care | Payer: BC Managed Care – PPO | Attending: General Surgery | Admitting: General Surgery

## 2015-05-17 DIAGNOSIS — F17210 Nicotine dependence, cigarettes, uncomplicated: Secondary | ICD-10-CM | POA: Diagnosis present

## 2015-05-17 DIAGNOSIS — S0511XA Contusion of eyeball and orbital tissues, right eye, initial encounter: Secondary | ICD-10-CM | POA: Diagnosis present

## 2015-05-17 DIAGNOSIS — Z23 Encounter for immunization: Secondary | ICD-10-CM | POA: Diagnosis not present

## 2015-05-17 DIAGNOSIS — S060X0A Concussion without loss of consciousness, initial encounter: Secondary | ICD-10-CM

## 2015-05-17 DIAGNOSIS — T796XXA Traumatic ischemia of muscle, initial encounter: Secondary | ICD-10-CM | POA: Diagnosis present

## 2015-05-17 DIAGNOSIS — N400 Enlarged prostate without lower urinary tract symptoms: Secondary | ICD-10-CM | POA: Diagnosis present

## 2015-05-17 DIAGNOSIS — R4781 Slurred speech: Secondary | ICD-10-CM | POA: Diagnosis present

## 2015-05-17 DIAGNOSIS — W19XXXA Unspecified fall, initial encounter: Secondary | ICD-10-CM | POA: Diagnosis not present

## 2015-05-17 DIAGNOSIS — R079 Chest pain, unspecified: Secondary | ICD-10-CM | POA: Diagnosis not present

## 2015-05-17 DIAGNOSIS — S0512XA Contusion of eyeball and orbital tissues, left eye, initial encounter: Secondary | ICD-10-CM | POA: Diagnosis present

## 2015-05-17 DIAGNOSIS — Y92009 Unspecified place in unspecified non-institutional (private) residence as the place of occurrence of the external cause: Secondary | ICD-10-CM | POA: Diagnosis not present

## 2015-05-17 DIAGNOSIS — S0292XA Unspecified fracture of facial bones, initial encounter for closed fracture: Secondary | ICD-10-CM | POA: Diagnosis present

## 2015-05-17 DIAGNOSIS — R2681 Unsteadiness on feet: Secondary | ICD-10-CM | POA: Diagnosis present

## 2015-05-17 DIAGNOSIS — D72829 Elevated white blood cell count, unspecified: Secondary | ICD-10-CM | POA: Diagnosis present

## 2015-05-17 DIAGNOSIS — I639 Cerebral infarction, unspecified: Secondary | ICD-10-CM

## 2015-05-17 DIAGNOSIS — S0083XA Contusion of other part of head, initial encounter: Secondary | ICD-10-CM | POA: Diagnosis present

## 2015-05-17 DIAGNOSIS — D62 Acute posthemorrhagic anemia: Secondary | ICD-10-CM | POA: Diagnosis not present

## 2015-05-17 DIAGNOSIS — R404 Transient alteration of awareness: Secondary | ICD-10-CM | POA: Diagnosis not present

## 2015-05-17 DIAGNOSIS — R7989 Other specified abnormal findings of blood chemistry: Secondary | ICD-10-CM | POA: Diagnosis present

## 2015-05-17 DIAGNOSIS — M6282 Rhabdomyolysis: Secondary | ICD-10-CM | POA: Diagnosis present

## 2015-05-17 DIAGNOSIS — S028XXA Fractures of other specified skull and facial bones, initial encounter for closed fracture: Secondary | ICD-10-CM | POA: Diagnosis present

## 2015-05-17 DIAGNOSIS — W19XXXD Unspecified fall, subsequent encounter: Secondary | ICD-10-CM | POA: Diagnosis not present

## 2015-05-17 DIAGNOSIS — S020XXA Fracture of vault of skull, initial encounter for closed fracture: Secondary | ICD-10-CM | POA: Diagnosis present

## 2015-05-17 DIAGNOSIS — E875 Hyperkalemia: Secondary | ICD-10-CM | POA: Diagnosis present

## 2015-05-17 DIAGNOSIS — R4182 Altered mental status, unspecified: Secondary | ICD-10-CM | POA: Diagnosis present

## 2015-05-17 DIAGNOSIS — R40242 Glasgow coma scale score 9-12: Secondary | ICD-10-CM | POA: Diagnosis present

## 2015-05-17 DIAGNOSIS — S060X9A Concussion with loss of consciousness of unspecified duration, initial encounter: Secondary | ICD-10-CM | POA: Diagnosis present

## 2015-05-17 DIAGNOSIS — G4733 Obstructive sleep apnea (adult) (pediatric): Secondary | ICD-10-CM | POA: Diagnosis present

## 2015-05-17 DIAGNOSIS — R739 Hyperglycemia, unspecified: Secondary | ICD-10-CM | POA: Diagnosis present

## 2015-05-17 DIAGNOSIS — R778 Other specified abnormalities of plasma proteins: Secondary | ICD-10-CM | POA: Diagnosis present

## 2015-05-17 DIAGNOSIS — M25539 Pain in unspecified wrist: Secondary | ICD-10-CM

## 2015-05-17 DIAGNOSIS — W108XXA Fall (on) (from) other stairs and steps, initial encounter: Secondary | ICD-10-CM | POA: Diagnosis present

## 2015-05-17 DIAGNOSIS — G4700 Insomnia, unspecified: Secondary | ICD-10-CM | POA: Diagnosis present

## 2015-05-17 DIAGNOSIS — Z9884 Bariatric surgery status: Secondary | ICD-10-CM | POA: Diagnosis not present

## 2015-05-17 DIAGNOSIS — F319 Bipolar disorder, unspecified: Secondary | ICD-10-CM | POA: Diagnosis present

## 2015-05-17 DIAGNOSIS — M25531 Pain in right wrist: Secondary | ICD-10-CM

## 2015-05-17 DIAGNOSIS — I4581 Long QT syndrome: Secondary | ICD-10-CM | POA: Diagnosis not present

## 2015-05-17 DIAGNOSIS — R9431 Abnormal electrocardiogram [ECG] [EKG]: Secondary | ICD-10-CM

## 2015-05-17 DIAGNOSIS — R40241 Glasgow coma scale score 13-15, unspecified time: Secondary | ICD-10-CM

## 2015-05-17 LAB — CBC WITH DIFFERENTIAL/PLATELET
Basophils Absolute: 0 10*3/uL (ref 0.0–0.1)
Basophils Relative: 0 % (ref 0–1)
EOS ABS: 0 10*3/uL (ref 0.0–0.7)
EOS PCT: 0 % (ref 0–5)
HCT: 39 % (ref 39.0–52.0)
Hemoglobin: 13.1 g/dL (ref 13.0–17.0)
LYMPHS ABS: 1 10*3/uL (ref 0.7–4.0)
Lymphocytes Relative: 8 % — ABNORMAL LOW (ref 12–46)
MCH: 32.6 pg (ref 26.0–34.0)
MCHC: 33.6 g/dL (ref 30.0–36.0)
MCV: 97 fL (ref 78.0–100.0)
Monocytes Absolute: 0.6 10*3/uL (ref 0.1–1.0)
Monocytes Relative: 5 % (ref 3–12)
Neutro Abs: 10.6 10*3/uL — ABNORMAL HIGH (ref 1.7–7.7)
Neutrophils Relative %: 87 % — ABNORMAL HIGH (ref 43–77)
PLATELETS: 277 10*3/uL (ref 150–400)
RBC: 4.02 MIL/uL — AB (ref 4.22–5.81)
RDW: 13.3 % (ref 11.5–15.5)
WBC: 12.2 10*3/uL — AB (ref 4.0–10.5)

## 2015-05-17 LAB — COMPREHENSIVE METABOLIC PANEL
ALK PHOS: 82 U/L (ref 38–126)
ALT: 66 U/L — ABNORMAL HIGH (ref 17–63)
ANION GAP: 7 (ref 5–15)
AST: 108 U/L — ABNORMAL HIGH (ref 15–41)
Albumin: 3.5 g/dL (ref 3.5–5.0)
BILIRUBIN TOTAL: 0.9 mg/dL (ref 0.3–1.2)
BUN: 15 mg/dL (ref 6–20)
CALCIUM: 8.7 mg/dL — AB (ref 8.9–10.3)
CO2: 26 mmol/L (ref 22–32)
CREATININE: 0.86 mg/dL (ref 0.61–1.24)
Chloride: 106 mmol/L (ref 101–111)
Glucose, Bld: 140 mg/dL — ABNORMAL HIGH (ref 65–99)
Potassium: 3.8 mmol/L (ref 3.5–5.1)
Sodium: 139 mmol/L (ref 135–145)
TOTAL PROTEIN: 6.4 g/dL — AB (ref 6.5–8.1)

## 2015-05-17 LAB — CK TOTAL AND CKMB (NOT AT ARMC)
CK TOTAL: 4650 U/L — AB (ref 49–397)
CK, MB: 85.2 ng/mL — ABNORMAL HIGH (ref 0.5–5.0)
Relative Index: 1.8 (ref 0.0–2.5)

## 2015-05-17 LAB — I-STAT CHEM 8, ED
BUN: 25 mg/dL — AB (ref 6–20)
CALCIUM ION: 1.11 mmol/L — AB (ref 1.12–1.23)
CREATININE: 0.9 mg/dL (ref 0.61–1.24)
Chloride: 103 mmol/L (ref 101–111)
GLUCOSE: 128 mg/dL — AB (ref 65–99)
HCT: 41 % (ref 39.0–52.0)
Hemoglobin: 13.9 g/dL (ref 13.0–17.0)
Potassium: 5.7 mmol/L — ABNORMAL HIGH (ref 3.5–5.1)
SODIUM: 137 mmol/L (ref 135–145)
TCO2: 27 mmol/L (ref 0–100)

## 2015-05-17 LAB — URINALYSIS, ROUTINE W REFLEX MICROSCOPIC
BILIRUBIN URINE: NEGATIVE
Glucose, UA: NEGATIVE mg/dL
KETONES UR: NEGATIVE mg/dL
NITRITE: NEGATIVE
Protein, ur: NEGATIVE mg/dL
Specific Gravity, Urine: 1.01 (ref 1.005–1.030)
UROBILINOGEN UA: 0.2 mg/dL (ref 0.0–1.0)
pH: 5.5 (ref 5.0–8.0)

## 2015-05-17 LAB — RAPID URINE DRUG SCREEN, HOSP PERFORMED
Amphetamines: NOT DETECTED
BENZODIAZEPINES: NOT DETECTED
Barbiturates: NOT DETECTED
COCAINE: NOT DETECTED
Opiates: NOT DETECTED
Tetrahydrocannabinol: POSITIVE — AB

## 2015-05-17 LAB — LACTIC ACID, PLASMA: Lactic Acid, Venous: 1.7 mmol/L (ref 0.5–2.0)

## 2015-05-17 LAB — PROTIME-INR
INR: 1.05 (ref 0.00–1.49)
PROTHROMBIN TIME: 13.9 s (ref 11.6–15.2)

## 2015-05-17 LAB — CBG MONITORING, ED: Glucose-Capillary: 131 mg/dL — ABNORMAL HIGH (ref 65–99)

## 2015-05-17 LAB — ETHANOL: Alcohol, Ethyl (B): <5 mg/dL (ref <5)

## 2015-05-17 LAB — TROPONIN I
TROPONIN I: 1.82 ng/mL — AB (ref <0.031)
Troponin I: 1.52 ng/mL (ref <0.031)

## 2015-05-17 LAB — URINE MICROSCOPIC-ADD ON

## 2015-05-17 MED ORDER — VALPROATE SODIUM 500 MG/5ML IV SOLN
500.0000 mg | Freq: Two times a day (BID) | INTRAVENOUS | Status: DC
  Administered 2015-05-17: 500 mg via INTRAVENOUS
  Filled 2015-05-17 (×3): qty 5

## 2015-05-17 MED ORDER — SODIUM POLYSTYRENE SULFONATE 15 GM/60ML PO SUSP
30.0000 g | Freq: Once | ORAL | Status: AC
  Administered 2015-05-18: 30 g via RECTAL
  Filled 2015-05-17: qty 120

## 2015-05-17 MED ORDER — PANTOPRAZOLE SODIUM 40 MG PO TBEC
40.0000 mg | DELAYED_RELEASE_TABLET | Freq: Every day | ORAL | Status: DC
  Administered 2015-05-18 – 2015-05-19 (×2): 40 mg via ORAL
  Filled 2015-05-17 (×2): qty 1

## 2015-05-17 MED ORDER — NICOTINE 7 MG/24HR TD PT24
7.0000 mg | MEDICATED_PATCH | Freq: Every day | TRANSDERMAL | Status: DC
  Administered 2015-05-18 – 2015-05-19 (×2): 7 mg via TRANSDERMAL
  Filled 2015-05-17 (×2): qty 1

## 2015-05-17 MED ORDER — MORPHINE SULFATE (PF) 2 MG/ML IV SOLN: 1.0000 mg | INTRAVENOUS | Status: DC | PRN

## 2015-05-17 MED ORDER — ONDANSETRON HCL 4 MG/2ML IJ SOLN: 4.0000 mg | Freq: Four times a day (QID) | INTRAMUSCULAR | Status: DC | PRN

## 2015-05-17 MED ORDER — SODIUM CHLORIDE 0.9 % IV SOLN: INTRAVENOUS | Status: DC

## 2015-05-17 MED ORDER — PANTOPRAZOLE SODIUM 40 MG IV SOLR
40.0000 mg | Freq: Every day | INTRAVENOUS | Status: DC
  Filled 2015-05-17 (×2): qty 40

## 2015-05-17 MED ORDER — SODIUM CHLORIDE 0.9 % IV SOLN
1.0000 g | Freq: Once | INTRAVENOUS | Status: AC
  Administered 2015-05-18: 1 g via INTRAVENOUS
  Filled 2015-05-17: qty 10

## 2015-05-17 MED ORDER — SODIUM CHLORIDE 0.9 % IV BOLUS (SEPSIS)
1000.0000 mL | Freq: Once | INTRAVENOUS | Status: AC
  Administered 2015-05-17: 1000 mL via INTRAVENOUS

## 2015-05-17 MED ORDER — SODIUM CHLORIDE 0.9 % IV SOLN
INTRAVENOUS | Status: DC
  Administered 2015-05-17: 23:00:00 via INTRAVENOUS

## 2015-05-17 MED ORDER — ASPIRIN 81 MG PO CHEW: 324.0000 mg | CHEWABLE_TABLET | Freq: Once | ORAL | Status: DC

## 2015-05-17 MED ORDER — ONDANSETRON HCL 4 MG PO TABS: 4.0000 mg | ORAL_TABLET | Freq: Four times a day (QID) | ORAL | Status: DC | PRN

## 2015-05-17 NOTE — H&P (Signed)
Alvin Allen is an 54 y.o. male.   Chief Complaint: fall HPI: The pt is a 54 yo wm who fell earlier today and was down for an unknown period of time. He is alert and oriented to person but no place or time and he is acting erratically.  No past medical history on file.  No past surgical history on file.  No family history on file. Social History:  has no tobacco, alcohol, and drug history on file.  Allergies: No Known Allergies   (Not in a hospital admission)  Results for orders placed or performed during the hospital encounter of 05/17/15 (from the past 48 hour(s))  CBG monitoring, ED     Status: Abnormal   Collection Time: 05/17/15  5:58 PM  Result Value Ref Range   Glucose-Capillary 131 (H) 65 - 99 mg/dL  Sample to Blood Bank     Status: None   Collection Time: 05/17/15  6:10 PM  Result Value Ref Range   Blood Bank Specimen SAMPLE AVAILABLE FOR TESTING    Sample Expiration 05/18/2015   Protime-INR     Status: None   Collection Time: 05/17/15  6:12 PM  Result Value Ref Range   Prothrombin Time 13.9 11.6 - 15.2 seconds   INR 1.05 0.00 - 1.49  CBC WITH DIFFERENTIAL     Status: Abnormal   Collection Time: 05/17/15  6:12 PM  Result Value Ref Range   WBC 12.2 (H) 4.0 - 10.5 K/uL   RBC 4.02 (L) 4.22 - 5.81 MIL/uL   Hemoglobin 13.1 13.0 - 17.0 g/dL   HCT 39.0 39.0 - 52.0 %   MCV 97.0 78.0 - 100.0 fL   MCH 32.6 26.0 - 34.0 pg   MCHC 33.6 30.0 - 36.0 g/dL   RDW 13.3 11.5 - 15.5 %   Platelets 277 150 - 400 K/uL   Neutrophils Relative % 87 (H) 43 - 77 %   Neutro Abs 10.6 (H) 1.7 - 7.7 K/uL   Lymphocytes Relative 8 (L) 12 - 46 %   Lymphs Abs 1.0 0.7 - 4.0 K/uL   Monocytes Relative 5 3 - 12 %   Monocytes Absolute 0.6 0.1 - 1.0 K/uL   Eosinophils Relative 0 0 - 5 %   Eosinophils Absolute 0.0 0.0 - 0.7 K/uL   Basophils Relative 0 0 - 1 %   Basophils Absolute 0.0 0.0 - 0.1 K/uL  CK total and CKMB (cardiac)not at [facility]     Status: Abnormal   Collection Time: 05/17/15  6:12 PM   Result Value Ref Range   Total CK 4650 (H) 49 - 397 U/L    Comment: RESULTS CONFIRMED BY MANUAL DILUTION   CK, MB 85.2 (H) 0.5 - 5.0 ng/mL   Relative Index 1.8 0.0 - 2.5  Ethanol     Status: None   Collection Time: 05/17/15  6:12 PM  Result Value Ref Range   Alcohol, Ethyl (B) <5 <5 mg/dL    Comment:        LOWEST DETECTABLE LIMIT FOR SERUM ALCOHOL IS 5 mg/dL FOR MEDICAL PURPOSES ONLY   Comprehensive metabolic panel     Status: Abnormal   Collection Time: 05/17/15  6:12 PM  Result Value Ref Range   Sodium 139 135 - 145 mmol/L   Potassium 3.8 3.5 - 5.1 mmol/L   Chloride 106 101 - 111 mmol/L   CO2 26 22 - 32 mmol/L   Glucose, Bld 140 (H) 65 - 99 mg/dL   BUN 15  6 - 20 mg/dL   Creatinine, Ser 0.86 0.61 - 1.24 mg/dL   Calcium 8.7 (L) 8.9 - 10.3 mg/dL   Total Protein 6.4 (L) 6.5 - 8.1 g/dL   Albumin 3.5 3.5 - 5.0 g/dL   AST 108 (H) 15 - 41 U/L   ALT 66 (H) 17 - 63 U/L   Alkaline Phosphatase 82 38 - 126 U/L   Total Bilirubin 0.9 0.3 - 1.2 mg/dL   GFR calc non Af Amer >60 >60 mL/min   GFR calc Af Amer >60 >60 mL/min    Comment: (NOTE) The eGFR has been calculated using the CKD EPI equation. This calculation has not been validated in all clinical situations. eGFR's persistently <60 mL/min signify possible Chronic Kidney Disease.    Anion gap 7 5 - 15  Troponin I     Status: Abnormal   Collection Time: 05/17/15  6:12 PM  Result Value Ref Range   Troponin I 1.52 (HH) <0.031 ng/mL    Comment:        POSSIBLE MYOCARDIAL ISCHEMIA. SERIAL TESTING RECOMMENDED. CRITICAL RESULT CALLED TO, READ BACK BY AND VERIFIED WITH: H ROBERTSON,RN [id number] 05/17/15 D BRADLEY   Lactic acid, plasma     Status: None   Collection Time: 05/17/15  6:21 PM  Result Value Ref Range   Lactic Acid, Venous 1.7 0.5 - 2.0 mmol/L  Urine rapid drug screen (hosp performed)not at [facility]     Status: Abnormal   Collection Time: 05/17/15  6:30 PM  Result Value Ref Range   Opiates NONE DETECTED NONE DETECTED    Cocaine NONE DETECTED NONE DETECTED   Benzodiazepines NONE DETECTED NONE DETECTED   Amphetamines NONE DETECTED NONE DETECTED   Tetrahydrocannabinol POSITIVE (A) NONE DETECTED   Barbiturates NONE DETECTED NONE DETECTED    Comment:        DRUG SCREEN FOR MEDICAL PURPOSES ONLY.  IF CONFIRMATION IS NEEDED FOR ANY PURPOSE, NOTIFY LAB WITHIN 5 DAYS.        LOWEST DETECTABLE LIMITS FOR URINE DRUG SCREEN Drug Class       Cutoff (ng/mL) Amphetamine      1000 Barbiturate      200 Benzodiazepine   628 Tricyclics       315 Opiates          300 Cocaine          300 THC              50   I-Stat Chem 8, ED  (not at [facility])     Status: Abnormal   Collection Time: 05/17/15  6:44 PM  Result Value Ref Range   Sodium 137 135 - 145 mmol/L   Potassium 5.7 (H) 3.5 - 5.1 mmol/L   Chloride 103 101 - 111 mmol/L   BUN 25 (H) 6 - 20 mg/dL   Creatinine, Ser 0.90 0.61 - 1.24 mg/dL   Glucose, Bld 128 (H) 65 - 99 mg/dL   Calcium, Ion 1.11 (L) 1.12 - 1.23 mmol/L   TCO2 27 0 - 100 mmol/L   Hemoglobin 13.9 13.0 - 17.0 g/dL   HCT 41.0 39.0 - 52.0 %   Ct Head Wo Contrast  05/17/2015   CLINICAL DATA:  Patient fell down stairs  EXAM: CT HEAD WITHOUT CONTRAST  CT MAXILLOFACIAL WITHOUT CONTRAST  CT CERVICAL SPINE WITHOUT CONTRAST  TECHNIQUE: Multidetector CT imaging of the head, cervical spine, and maxillofacial structures were performed using the standard protocol without intravenous contrast. Multiplanar CT image reconstructions  of the cervical spine and maxillofacial structures were also generated.  COMPARISON:  None.  FINDINGS: CT HEAD FINDINGS  The ventricles are normal in size and configuration. There is no intracranial mass hemorrhage, extra-axial fluid collection, or midline shift. The gray-white compartments appear normal. No acute infarct is evident. There is a large hematoma overlying the left frontal bone. There is a nondisplaced fracture of the anterior superior left orbital rim extending to the inferior  left frontal bone with air along the superior most aspect of the orbit. This air is confined to the intraorbital region. The bony calvarium otherwise appears intact. The mastoid air cells are clear.  CT MAXILLOFACIAL FINDINGS  There is marked soft tissue swelling over the left frontal bone with acute hematoma. There is a fracture of the anterior superior left orbital rim extending into the inferior left frontal bone focal air in the superior posterior aspect of the left orbit. There is a fracture of the medial posterior left maxillary wall. No other fractures are apparent. There is no dislocation. There is no intraorbital lesion beyond the air in the superior posterior left orbit. The globes appear intact as do the extraocular muscles and optic nerve regions.  There is soft tissue swelling over the upper face and preseptal orbital regions bilaterally.  There is extensive opacification of the left frontal sinus with air-fluid level. There is opacification of multiple ethmoid air cells bilaterally, more on the left than on the right. There is diffuse opacification of much of the left maxillary antrum with an air-fluid level. There is hemorrhage in the upper nares regions bilaterally. There is ostiomeatal unit obstruction bilaterally, presumably due to hemorrhage. There is moderate mucosal thickening in the right maxillary antrum. There is a small air-fluid level in the right sphenoid sinus.  The visualized pharyngeal air column is normal. Salivary glands appear normal. No adenopathy.  CT CERVICAL SPINE FINDINGS  There is no fracture or spondylolisthesis. Prevertebral soft tissues and predental space regions are normal. There is moderately severe disc space narrowing at C6-7 and C7-T1. There is milder narrowing at C4-5 and C5-6. There is mild narrowing at C3-4. There is no nerve root edema or effacement. There is calcification in the posterior longitudinal ligament at C4 and C5. No demonstrable disc extrusion or  high-grade stenosis.  There is alveolar opacity in each lung apex, consistent with either edema or possibly contusion.  IMPRESSION: Large soft tissue hematoma over the left frontal bone. Nondisplaced fracture inferior left frontal bone extending to the superior anterior orbital rim. No intracranial mass, hemorrhage, or extra-axial fluid collection. Gray-white compartments appear normal.  CT maxillofacial: Nondisplaced fractures of the anterior superior left orbital rim as well as the posterior left maxillary sinus wall. No other fractures are apparent. No dislocation. Extensive paranasal sinus disease. Obstruction of both ostiomeatal unit complexes, presumably due to blood. Soft tissue swelling noted over both upper facial and preseptal orbital regions. Swelling greater on the left than on the right. More superiorly, there is a hematoma over the left frontal bone.  CT cervical spine: Areas of osteoarthritic change. Calcification in the posterior longitudinal ligament at C4 and C5. No acute fracture or spondylolisthesis. Question edema versus contusion in the lung apices bilaterally.   Electronically Signed   By: Lowella Grip III M.D.   On: 05/17/2015 19:58   Ct Cervical Spine Wo Contrast  05/17/2015   CLINICAL DATA:  Patient fell down stairs  EXAM: CT HEAD WITHOUT CONTRAST  CT MAXILLOFACIAL WITHOUT CONTRAST  CT CERVICAL  SPINE WITHOUT CONTRAST  TECHNIQUE: Multidetector CT imaging of the head, cervical spine, and maxillofacial structures were performed using the standard protocol without intravenous contrast. Multiplanar CT image reconstructions of the cervical spine and maxillofacial structures were also generated.  COMPARISON:  None.  FINDINGS: CT HEAD FINDINGS  The ventricles are normal in size and configuration. There is no intracranial mass hemorrhage, extra-axial fluid collection, or midline shift. The gray-white compartments appear normal. No acute infarct is evident. There is a large hematoma overlying  the left frontal bone. There is a nondisplaced fracture of the anterior superior left orbital rim extending to the inferior left frontal bone with air along the superior most aspect of the orbit. This air is confined to the intraorbital region. The bony calvarium otherwise appears intact. The mastoid air cells are clear.  CT MAXILLOFACIAL FINDINGS  There is marked soft tissue swelling over the left frontal bone with acute hematoma. There is a fracture of the anterior superior left orbital rim extending into the inferior left frontal bone focal air in the superior posterior aspect of the left orbit. There is a fracture of the medial posterior left maxillary wall. No other fractures are apparent. There is no dislocation. There is no intraorbital lesion beyond the air in the superior posterior left orbit. The globes appear intact as do the extraocular muscles and optic nerve regions.  There is soft tissue swelling over the upper face and preseptal orbital regions bilaterally.  There is extensive opacification of the left frontal sinus with air-fluid level. There is opacification of multiple ethmoid air cells bilaterally, more on the left than on the right. There is diffuse opacification of much of the left maxillary antrum with an air-fluid level. There is hemorrhage in the upper nares regions bilaterally. There is ostiomeatal unit obstruction bilaterally, presumably due to hemorrhage. There is moderate mucosal thickening in the right maxillary antrum. There is a small air-fluid level in the right sphenoid sinus.  The visualized pharyngeal air column is normal. Salivary glands appear normal. No adenopathy.  CT CERVICAL SPINE FINDINGS  There is no fracture or spondylolisthesis. Prevertebral soft tissues and predental space regions are normal. There is moderately severe disc space narrowing at C6-7 and C7-T1. There is milder narrowing at C4-5 and C5-6. There is mild narrowing at C3-4. There is no nerve root edema or  effacement. There is calcification in the posterior longitudinal ligament at C4 and C5. No demonstrable disc extrusion or high-grade stenosis.  There is alveolar opacity in each lung apex, consistent with either edema or possibly contusion.  IMPRESSION: Large soft tissue hematoma over the left frontal bone. Nondisplaced fracture inferior left frontal bone extending to the superior anterior orbital rim. No intracranial mass, hemorrhage, or extra-axial fluid collection. Gray-white compartments appear normal.  CT maxillofacial: Nondisplaced fractures of the anterior superior left orbital rim as well as the posterior left maxillary sinus wall. No other fractures are apparent. No dislocation. Extensive paranasal sinus disease. Obstruction of both ostiomeatal unit complexes, presumably due to blood. Soft tissue swelling noted over both upper facial and preseptal orbital regions. Swelling greater on the left than on the right. More superiorly, there is a hematoma over the left frontal bone.  CT cervical spine: Areas of osteoarthritic change. Calcification in the posterior longitudinal ligament at C4 and C5. No acute fracture or spondylolisthesis. Question edema versus contusion in the lung apices bilaterally.   Electronically Signed   By: Lowella Grip III M.D.   On: 05/17/2015 19:58   Dg  Pelvis Portable  05/17/2015   CLINICAL DATA:  Trauma.  Patient fell today.  EXAM: PORTABLE PELVIS 1-2 VIEWS  COMPARISON:  None.  FINDINGS: The right iliac crest is excluded. No evidence for acute fracture or subluxation. Regional bowel gas pattern is nonobstructed. Surgical clips are noted in the right upper quadrant.  IMPRESSION: 1.  No evidence for acute  abnormality. 2. Right iliac crest excluded.   Electronically Signed   By: Nolon Nations M.D.   On: 05/17/2015 18:43   Dg Chest Portable 1 View  05/17/2015   CLINICAL DATA:  Pain following fall  EXAM: PORTABLE CHEST - 1 VIEW  COMPARISON:  None.  FINDINGS: There is no edema or  consolidation. Heart size and pulmonary vascularity are normal. No adenopathy. No bone lesions.  IMPRESSION: No edema or consolidation.  No pneumothorax.   Electronically Signed   By: Lowella Grip III M.D.   On: 05/17/2015 18:42   Ct Maxillofacial Wo Cm  05/17/2015   CLINICAL DATA:  Patient fell down stairs  EXAM: CT HEAD WITHOUT CONTRAST  CT MAXILLOFACIAL WITHOUT CONTRAST  CT CERVICAL SPINE WITHOUT CONTRAST  TECHNIQUE: Multidetector CT imaging of the head, cervical spine, and maxillofacial structures were performed using the standard protocol without intravenous contrast. Multiplanar CT image reconstructions of the cervical spine and maxillofacial structures were also generated.  COMPARISON:  None.  FINDINGS: CT HEAD FINDINGS  The ventricles are normal in size and configuration. There is no intracranial mass hemorrhage, extra-axial fluid collection, or midline shift. The gray-white compartments appear normal. No acute infarct is evident. There is a large hematoma overlying the left frontal bone. There is a nondisplaced fracture of the anterior superior left orbital rim extending to the inferior left frontal bone with air along the superior most aspect of the orbit. This air is confined to the intraorbital region. The bony calvarium otherwise appears intact. The mastoid air cells are clear.  CT MAXILLOFACIAL FINDINGS  There is marked soft tissue swelling over the left frontal bone with acute hematoma. There is a fracture of the anterior superior left orbital rim extending into the inferior left frontal bone focal air in the superior posterior aspect of the left orbit. There is a fracture of the medial posterior left maxillary wall. No other fractures are apparent. There is no dislocation. There is no intraorbital lesion beyond the air in the superior posterior left orbit. The globes appear intact as do the extraocular muscles and optic nerve regions.  There is soft tissue swelling over the upper face and  preseptal orbital regions bilaterally.  There is extensive opacification of the left frontal sinus with air-fluid level. There is opacification of multiple ethmoid air cells bilaterally, more on the left than on the right. There is diffuse opacification of much of the left maxillary antrum with an air-fluid level. There is hemorrhage in the upper nares regions bilaterally. There is ostiomeatal unit obstruction bilaterally, presumably due to hemorrhage. There is moderate mucosal thickening in the right maxillary antrum. There is a small air-fluid level in the right sphenoid sinus.  The visualized pharyngeal air column is normal. Salivary glands appear normal. No adenopathy.  CT CERVICAL SPINE FINDINGS  There is no fracture or spondylolisthesis. Prevertebral soft tissues and predental space regions are normal. There is moderately severe disc space narrowing at C6-7 and C7-T1. There is milder narrowing at C4-5 and C5-6. There is mild narrowing at C3-4. There is no nerve root edema or effacement. There is calcification in the posterior longitudinal  ligament at C4 and C5. No demonstrable disc extrusion or high-grade stenosis.  There is alveolar opacity in each lung apex, consistent with either edema or possibly contusion.  IMPRESSION: Large soft tissue hematoma over the left frontal bone. Nondisplaced fracture inferior left frontal bone extending to the superior anterior orbital rim. No intracranial mass, hemorrhage, or extra-axial fluid collection. Gray-white compartments appear normal.  CT maxillofacial: Nondisplaced fractures of the anterior superior left orbital rim as well as the posterior left maxillary sinus wall. No other fractures are apparent. No dislocation. Extensive paranasal sinus disease. Obstruction of both ostiomeatal unit complexes, presumably due to blood. Soft tissue swelling noted over both upper facial and preseptal orbital regions. Swelling greater on the left than on the right. More superiorly,  there is a hematoma over the left frontal bone.  CT cervical spine: Areas of osteoarthritic change. Calcification in the posterior longitudinal ligament at C4 and C5. No acute fracture or spondylolisthesis. Question edema versus contusion in the lung apices bilaterally.   Electronically Signed   By: Lowella Grip III M.D.   On: 05/17/2015 19:58    Review of Systems  Constitutional: Negative.   HENT: Negative.   Eyes: Negative.   Respiratory: Negative.   Cardiovascular: Negative.   Gastrointestinal: Negative.   Genitourinary: Negative.   Musculoskeletal: Negative.   Skin: Negative.   Neurological: Positive for seizures.  Endo/Heme/Allergies: Negative.   Psychiatric/Behavioral: Positive for substance abuse.    Blood pressure 135/96, pulse 87, resp. rate 18, SpO2 96 %. Physical Exam  Constitutional: He appears well-developed and well-nourished.  HENT:  Bruising around eyes  Eyes: Conjunctivae and EOM are normal. Pupils are equal, round, and reactive to light.  Neck: Normal range of motion. Neck supple.  Cardiovascular: Normal rate, regular rhythm and normal heart sounds.   Respiratory: Effort normal and breath sounds normal.  GI: Soft. There is no tenderness.  Musculoskeletal: Normal range of motion.  Neurological: He is alert.  Seems to answer appropriately but them screams out at times and does not know exactly where he is  Skin: Skin is warm and dry.     Assessment/Plan Fall Multilple facial fxs. Will consult maxillofacial Rhabdo. Will admit to icu. Keep uop high. Consider alkalynizing urine. Consult CCM Elevated troponin. Consult cards  TOTH III,Tymber Stallings S 05/17/2015, 9:25 PM

## 2015-05-17 NOTE — Consult Note (Signed)
Cardiology Consult  Referring Physician: ER, Trauma Reason for Consultation: Elevated Troponin   HPI: 54 yo with history of Bipolar disorder, has had seizure in summer, on a number of psychotropic meds per wife (need to get the list) medications, smokes couple cigarettes a day, was found unresponsive with blood in the kitchen and on the porch at around 5 pm by the family member. According to wife, he has not slept in 4 days due to another episode of insomnia appeared to have slurred speech and staggering gate this morning when wife was leaving for work in the morning. No known history of CAD, MI, HF, CVA, DM or rhythm disorder. He used to be a heavy drinker but not any more.    He has suffered from Nondisplaced fractures of the anterior superior left orbital rim as well as the posterior left maxillary sinus wall. There is a large hematoma overlying the left frontal bone. His TnI 1.52, CPK 4650, UDS + for THC, K 5.7, BUN 25, Cr 1.1, ECH NSR, ns IVCD, long QT over 500 ms (no prior ECG available for comparison). Bedside limited echo suggest mild to moderate LV systolic dysfunction with estimated LVEF about 40-45%.   Review of Systems:  He is lethargic and not following commands   PMH:  Bipolar Seizure  No past medical history on file.   (Not in a hospital admission)   . aspirin  324 mg Oral Once    Infusions:    No Known Allergies  Social History   Social History  . Marital Status: Married    Spouse Name: N/A  . Number of Children: N/A  . Years of Education: N/A   Occupational History  . Not on file.   Social History Main Topics  . Smoking status: Not on file  . Smokeless tobacco: Not on file  . Alcohol Use: Not on file  . Drug Use: Not on file  . Sexual Activity: Not on file   Other Topics Concern  . Not on file   Social History Narrative  . No narrative on file   SoHx:  Smoker   No family history on file.  PHYSICAL EXAM: Filed Vitals:   05/17/15 2115    BP: 135/96  Pulse: 87  Resp: 18     Intake/Output Summary (Last 24 hours) at 05/17/15 2204 Last data filed at 05/17/15 2154  Gross per 24 hour  Intake  16109 ml  Output   1000 ml  Net   9000 ml    General:  c collar in place,  HEENT: periorbital ecchymosis, injected conjunctiva Neck: c colalr Cor: PMI nondisplaced. Regular rate & rhythm. No rubs, gallops or murmurs. Lungs: clear Abdomen: soft, nontender, nondistended. No hepatosplenomegaly. No bruits or masses. Good bowel sounds. Extremities: no cyanosis, clubbing, rash, edema Neuro: lethargic, confused, not following commands     Results for orders placed or performed during the hospital encounter of 05/17/15 (from the past 24 hour(s))  CBG monitoring, ED     Status: Abnormal   Collection Time: 05/17/15  5:58 PM  Result Value Ref Range   Glucose-Capillary 131 (H) 65 - 99 mg/dL  Sample to Blood Bank     Status: None   Collection Time: 05/17/15  6:10 PM  Result Value Ref Range   Blood Bank Specimen SAMPLE AVAILABLE FOR TESTING    Sample Expiration 05/18/2015   Protime-INR     Status: None   Collection Time: 05/17/15  6:12 PM  Result Value Ref  Range   Prothrombin Time 13.9 11.6 - 15.2 seconds   INR 1.05 0.00 - 1.49  CBC WITH DIFFERENTIAL     Status: Abnormal   Collection Time: 05/17/15  6:12 PM  Result Value Ref Range   WBC 12.2 (H) 4.0 - 10.5 K/uL   RBC 4.02 (L) 4.22 - 5.81 MIL/uL   Hemoglobin 13.1 13.0 - 17.0 g/dL   HCT 40.9 81.1 - 91.4 %   MCV 97.0 78.0 - 100.0 fL   MCH 32.6 26.0 - 34.0 pg   MCHC 33.6 30.0 - 36.0 g/dL   RDW 78.2 95.6 - 21.3 %   Platelets 277 150 - 400 K/uL   Neutrophils Relative % 87 (H) 43 - 77 %   Neutro Abs 10.6 (H) 1.7 - 7.7 K/uL   Lymphocytes Relative 8 (L) 12 - 46 %   Lymphs Abs 1.0 0.7 - 4.0 K/uL   Monocytes Relative 5 3 - 12 %   Monocytes Absolute 0.6 0.1 - 1.0 K/uL   Eosinophils Relative 0 0 - 5 %   Eosinophils Absolute 0.0 0.0 - 0.7 K/uL   Basophils Relative 0 0 - 1 %    Basophils Absolute 0.0 0.0 - 0.1 K/uL  CK total and CKMB (cardiac)not at [facility]     Status: Abnormal   Collection Time: 05/17/15  6:12 PM  Result Value Ref Range   Total CK 4650 (H) 49 - 397 U/L   CK, MB 85.2 (H) 0.5 - 5.0 ng/mL   Relative Index 1.8 0.0 - 2.5  Ethanol     Status: None   Collection Time: 05/17/15  6:12 PM  Result Value Ref Range   Alcohol, Ethyl (B) <5 <5 mg/dL  Comprehensive metabolic panel     Status: Abnormal   Collection Time: 05/17/15  6:12 PM  Result Value Ref Range   Sodium 139 135 - 145 mmol/L   Potassium 3.8 3.5 - 5.1 mmol/L   Chloride 106 101 - 111 mmol/L   CO2 26 22 - 32 mmol/L   Glucose, Bld 140 (H) 65 - 99 mg/dL   BUN 15 6 - 20 mg/dL   Creatinine, Ser 0.86 0.61 - 1.24 mg/dL   Calcium 8.7 (L) 8.9 - 10.3 mg/dL   Total Protein 6.4 (L) 6.5 - 8.1 g/dL   Albumin 3.5 3.5 - 5.0 g/dL   AST 578 (H) 15 - 41 U/L   ALT 66 (H) 17 - 63 U/L   Alkaline Phosphatase 82 38 - 126 U/L   Total Bilirubin 0.9 0.3 - 1.2 mg/dL   GFR calc non Af Amer >60 >60 mL/min   GFR calc Af Amer >60 >60 mL/min   Anion gap 7 5 - 15  Troponin I     Status: Abnormal   Collection Time: 05/17/15  6:12 PM  Result Value Ref Range   Troponin I 1.52 (HH) <0.031 ng/mL  Lactic acid, plasma     Status: None   Collection Time: 05/17/15  6:21 PM  Result Value Ref Range   Lactic Acid, Venous 1.7 0.5 - 2.0 mmol/L  Urine rapid drug screen (hosp performed)not at [facility]     Status: Abnormal   Collection Time: 05/17/15  6:30 PM  Result Value Ref Range   Opiates NONE DETECTED NONE DETECTED   Cocaine NONE DETECTED NONE DETECTED   Benzodiazepines NONE DETECTED NONE DETECTED   Amphetamines NONE DETECTED NONE DETECTED   Tetrahydrocannabinol POSITIVE (A) NONE DETECTED   Barbiturates NONE DETECTED NONE DETECTED  I-Stat  Chem 8, ED  (not at [facility])     Status: Abnormal   Collection Time: 05/17/15  6:44 PM  Result Value Ref Range   Sodium 137 135 - 145 mmol/L   Potassium 5.7 (H) 3.5 - 5.1 mmol/L    Chloride 103 101 - 111 mmol/L   BUN 25 (H) 6 - 20 mg/dL   Creatinine, Ser 1.61 0.61 - 1.24 mg/dL   Glucose, Bld 096 (H) 65 - 99 mg/dL   Calcium, Ion 0.45 (L) 1.12 - 1.23 mmol/L   TCO2 27 0 - 100 mmol/L   Hemoglobin 13.9 13.0 - 17.0 g/dL   HCT 40.9 81.1 - 91.4 %   Ct Head Wo Contrast  05/17/2015   CLINICAL DATA:  Patient fell down stairs  EXAM: CT HEAD WITHOUT CONTRAST  CT MAXILLOFACIAL WITHOUT CONTRAST  CT CERVICAL SPINE WITHOUT CONTRAST  TECHNIQUE: Multidetector CT imaging of the head, cervical spine, and maxillofacial structures were performed using the standard protocol without intravenous contrast. Multiplanar CT image reconstructions of the cervical spine and maxillofacial structures were also generated.  COMPARISON:  None.  FINDINGS: CT HEAD FINDINGS  The ventricles are normal in size and configuration. There is no intracranial mass hemorrhage, extra-axial fluid collection, or midline shift. The gray-white compartments appear normal. No acute infarct is evident. There is a large hematoma overlying the left frontal bone. There is a nondisplaced fracture of the anterior superior left orbital rim extending to the inferior left frontal bone with air along the superior most aspect of the orbit. This air is confined to the intraorbital region. The bony calvarium otherwise appears intact. The mastoid air cells are clear.  CT MAXILLOFACIAL FINDINGS  There is marked soft tissue swelling over the left frontal bone with acute hematoma. There is a fracture of the anterior superior left orbital rim extending into the inferior left frontal bone focal air in the superior posterior aspect of the left orbit. There is a fracture of the medial posterior left maxillary wall. No other fractures are apparent. There is no dislocation. There is no intraorbital lesion beyond the air in the superior posterior left orbit. The globes appear intact as do the extraocular muscles and optic nerve regions.  There is soft tissue  swelling over the upper face and preseptal orbital regions bilaterally.  There is extensive opacification of the left frontal sinus with air-fluid level. There is opacification of multiple ethmoid air cells bilaterally, more on the left than on the right. There is diffuse opacification of much of the left maxillary antrum with an air-fluid level. There is hemorrhage in the upper nares regions bilaterally. There is ostiomeatal unit obstruction bilaterally, presumably due to hemorrhage. There is moderate mucosal thickening in the right maxillary antrum. There is a small air-fluid level in the right sphenoid sinus.  The visualized pharyngeal air column is normal. Salivary glands appear normal. No adenopathy.  CT CERVICAL SPINE FINDINGS  There is no fracture or spondylolisthesis. Prevertebral soft tissues and predental space regions are normal. There is moderately severe disc space narrowing at C6-7 and C7-T1. There is milder narrowing at C4-5 and C5-6. There is mild narrowing at C3-4. There is no nerve root edema or effacement. There is calcification in the posterior longitudinal ligament at C4 and C5. No demonstrable disc extrusion or high-grade stenosis.  There is alveolar opacity in each lung apex, consistent with either edema or possibly contusion.  IMPRESSION: Large soft tissue hematoma over the left frontal bone. Nondisplaced fracture inferior left frontal bone  extending to the superior anterior orbital rim. No intracranial mass, hemorrhage, or extra-axial fluid collection. Gray-white compartments appear normal.  CT maxillofacial: Nondisplaced fractures of the anterior superior left orbital rim as well as the posterior left maxillary sinus wall. No other fractures are apparent. No dislocation. Extensive paranasal sinus disease. Obstruction of both ostiomeatal unit complexes, presumably due to blood. Soft tissue swelling noted over both upper facial and preseptal orbital regions. Swelling greater on the left than  on the right. More superiorly, there is a hematoma over the left frontal bone.  CT cervical spine: Areas of osteoarthritic change. Calcification in the posterior longitudinal ligament at C4 and C5. No acute fracture or spondylolisthesis. Question edema versus contusion in the lung apices bilaterally.   Electronically Signed   By: Bretta Bang III M.D.   On: 05/17/2015 19:58   Ct Cervical Spine Wo Contrast  05/17/2015   CLINICAL DATA:  Patient fell down stairs  EXAM: CT HEAD WITHOUT CONTRAST  CT MAXILLOFACIAL WITHOUT CONTRAST  CT CERVICAL SPINE WITHOUT CONTRAST  TECHNIQUE: Multidetector CT imaging of the head, cervical spine, and maxillofacial structures were performed using the standard protocol without intravenous contrast. Multiplanar CT image reconstructions of the cervical spine and maxillofacial structures were also generated.  COMPARISON:  None.  FINDINGS: CT HEAD FINDINGS  The ventricles are normal in size and configuration. There is no intracranial mass hemorrhage, extra-axial fluid collection, or midline shift. The gray-white compartments appear normal. No acute infarct is evident. There is a large hematoma overlying the left frontal bone. There is a nondisplaced fracture of the anterior superior left orbital rim extending to the inferior left frontal bone with air along the superior most aspect of the orbit. This air is confined to the intraorbital region. The bony calvarium otherwise appears intact. The mastoid air cells are clear.  CT MAXILLOFACIAL FINDINGS  There is marked soft tissue swelling over the left frontal bone with acute hematoma. There is a fracture of the anterior superior left orbital rim extending into the inferior left frontal bone focal air in the superior posterior aspect of the left orbit. There is a fracture of the medial posterior left maxillary wall. No other fractures are apparent. There is no dislocation. There is no intraorbital lesion beyond the air in the superior  posterior left orbit. The globes appear intact as do the extraocular muscles and optic nerve regions.  There is soft tissue swelling over the upper face and preseptal orbital regions bilaterally.  There is extensive opacification of the left frontal sinus with air-fluid level. There is opacification of multiple ethmoid air cells bilaterally, more on the left than on the right. There is diffuse opacification of much of the left maxillary antrum with an air-fluid level. There is hemorrhage in the upper nares regions bilaterally. There is ostiomeatal unit obstruction bilaterally, presumably due to hemorrhage. There is moderate mucosal thickening in the right maxillary antrum. There is a small air-fluid level in the right sphenoid sinus.  The visualized pharyngeal air column is normal. Salivary glands appear normal. No adenopathy.  CT CERVICAL SPINE FINDINGS  There is no fracture or spondylolisthesis. Prevertebral soft tissues and predental space regions are normal. There is moderately severe disc space narrowing at C6-7 and C7-T1. There is milder narrowing at C4-5 and C5-6. There is mild narrowing at C3-4. There is no nerve root edema or effacement. There is calcification in the posterior longitudinal ligament at C4 and C5. No demonstrable disc extrusion or high-grade stenosis.  There is alveolar  opacity in each lung apex, consistent with either edema or possibly contusion.  IMPRESSION: Large soft tissue hematoma over the left frontal bone. Nondisplaced fracture inferior left frontal bone extending to the superior anterior orbital rim. No intracranial mass, hemorrhage, or extra-axial fluid collection. Gray-white compartments appear normal.  CT maxillofacial: Nondisplaced fractures of the anterior superior left orbital rim as well as the posterior left maxillary sinus wall. No other fractures are apparent. No dislocation. Extensive paranasal sinus disease. Obstruction of both ostiomeatal unit complexes, presumably due  to blood. Soft tissue swelling noted over both upper facial and preseptal orbital regions. Swelling greater on the left than on the right. More superiorly, there is a hematoma over the left frontal bone.  CT cervical spine: Areas of osteoarthritic change. Calcification in the posterior longitudinal ligament at C4 and C5. No acute fracture or spondylolisthesis. Question edema versus contusion in the lung apices bilaterally.   Electronically Signed   By: Bretta Bang III M.D.   On: 05/17/2015 19:58   Dg Pelvis Portable  05/17/2015   CLINICAL DATA:  Trauma.  Patient fell today.  EXAM: PORTABLE PELVIS 1-2 VIEWS  COMPARISON:  None.  FINDINGS: The right iliac crest is excluded. No evidence for acute fracture or subluxation. Regional bowel gas pattern is nonobstructed. Surgical clips are noted in the right upper quadrant.  IMPRESSION: 1.  No evidence for acute  abnormality. 2. Right iliac crest excluded.   Electronically Signed   By: Norva Pavlov M.D.   On: 05/17/2015 18:43   Dg Chest Portable 1 View  05/17/2015   CLINICAL DATA:  Pain following fall  EXAM: PORTABLE CHEST - 1 VIEW  COMPARISON:  None.  FINDINGS: There is no edema or consolidation. Heart size and pulmonary vascularity are normal. No adenopathy. No bone lesions.  IMPRESSION: No edema or consolidation.  No pneumothorax.   Electronically Signed   By: Bretta Bang III M.D.   On: 05/17/2015 18:42   Ct Maxillofacial Wo Cm  05/17/2015   CLINICAL DATA:  Patient fell down stairs  EXAM: CT HEAD WITHOUT CONTRAST  CT MAXILLOFACIAL WITHOUT CONTRAST  CT CERVICAL SPINE WITHOUT CONTRAST  TECHNIQUE: Multidetector CT imaging of the head, cervical spine, and maxillofacial structures were performed using the standard protocol without intravenous contrast. Multiplanar CT image reconstructions of the cervical spine and maxillofacial structures were also generated.  COMPARISON:  None.  FINDINGS: CT HEAD FINDINGS  The ventricles are normal in size and  configuration. There is no intracranial mass hemorrhage, extra-axial fluid collection, or midline shift. The gray-white compartments appear normal. No acute infarct is evident. There is a large hematoma overlying the left frontal bone. There is a nondisplaced fracture of the anterior superior left orbital rim extending to the inferior left frontal bone with air along the superior most aspect of the orbit. This air is confined to the intraorbital region. The bony calvarium otherwise appears intact. The mastoid air cells are clear.  CT MAXILLOFACIAL FINDINGS  There is marked soft tissue swelling over the left frontal bone with acute hematoma. There is a fracture of the anterior superior left orbital rim extending into the inferior left frontal bone focal air in the superior posterior aspect of the left orbit. There is a fracture of the medial posterior left maxillary wall. No other fractures are apparent. There is no dislocation. There is no intraorbital lesion beyond the air in the superior posterior left orbit. The globes appear intact as do the extraocular muscles and optic nerve regions.  There is soft tissue swelling over the upper face and preseptal orbital regions bilaterally.  There is extensive opacification of the left frontal sinus with air-fluid level. There is opacification of multiple ethmoid air cells bilaterally, more on the left than on the right. There is diffuse opacification of much of the left maxillary antrum with an air-fluid level. There is hemorrhage in the upper nares regions bilaterally. There is ostiomeatal unit obstruction bilaterally, presumably due to hemorrhage. There is moderate mucosal thickening in the right maxillary antrum. There is a small air-fluid level in the right sphenoid sinus.  The visualized pharyngeal air column is normal. Salivary glands appear normal. No adenopathy.  CT CERVICAL SPINE FINDINGS  There is no fracture or spondylolisthesis. Prevertebral soft tissues and  predental space regions are normal. There is moderately severe disc space narrowing at C6-7 and C7-T1. There is milder narrowing at C4-5 and C5-6. There is mild narrowing at C3-4. There is no nerve root edema or effacement. There is calcification in the posterior longitudinal ligament at C4 and C5. No demonstrable disc extrusion or high-grade stenosis.  There is alveolar opacity in each lung apex, consistent with either edema or possibly contusion.  IMPRESSION: Large soft tissue hematoma over the left frontal bone. Nondisplaced fracture inferior left frontal bone extending to the superior anterior orbital rim. No intracranial mass, hemorrhage, or extra-axial fluid collection. Gray-white compartments appear normal.  CT maxillofacial: Nondisplaced fractures of the anterior superior left orbital rim as well as the posterior left maxillary sinus wall. No other fractures are apparent. No dislocation. Extensive paranasal sinus disease. Obstruction of both ostiomeatal unit complexes, presumably due to blood. Soft tissue swelling noted over both upper facial and preseptal orbital regions. Swelling greater on the left than on the right. More superiorly, there is a hematoma over the left frontal bone.  CT cervical spine: Areas of osteoarthritic change. Calcification in the posterior longitudinal ligament at C4 and C5. No acute fracture or spondylolisthesis. Question edema versus contusion in the lung apices bilaterally.   Electronically Signed   By: Bretta Bang III M.D.   On: 05/17/2015 19:58     ASSESSMENT:  1. Elevated Troponin in the setting of possible syncope (seizure?) - prolonged insomnia can certainly predispose - high Tni appears to be due to type II MI (supply demand) and rhabdomyolysis - Long QT raises concern but could be due to metabolic abnormality and psych meds - Normal BP, NSR, normal O2 sat at this time on RA - Given facial and orbital trauma not a candidate for antithrombotic therapy so  will not pursue empiric treatment    PLAN/DISCUSSION:  Recommend supportive care Complete echo in am  Serial CK and Troponin Repeat ECG   Thanks for the consult.   Consult service will follow.    Nevin Bloodgood, MD Cardiology

## 2015-05-17 NOTE — ED Notes (Signed)
Pt here as a level 2 after being found down at bottom of steps by daughter , pt had intial GCS of 10 with a hematoma to his forehead

## 2015-05-17 NOTE — ED Notes (Signed)
Family at bedside. 

## 2015-05-17 NOTE — ED Notes (Signed)
Pt. Placed in Aspen collar with assistance from [facility].

## 2015-05-17 NOTE — ED Notes (Signed)
CRITICAL VALUE ALERT  Critical value received: TROPONIN 1.52  Date of notification:  05/17/15  Time of notification:  1855  Critical value read back: YES  Nurse who received alert:  Harlow Asa RN   MD Notified: MD Rhunette Croft

## 2015-05-17 NOTE — ED Notes (Signed)
Pt back from CT

## 2015-05-17 NOTE — Progress Notes (Signed)
   05/17/15 1737  Clinical Encounter Type  Visited With Family  Visit Type Psychological support  Referral From Nurse  Stress Factors  Patient Stress Factors None identified  Family Stress Factors Lack of knowledge  Trauma page received at 17:37, responded at 17:41. After reporting to nurses' station waited for family to arrive. They asked to speak to doctor, put them in consult room and notified desk.

## 2015-05-17 NOTE — Consult Note (Signed)
PULMONARY / CRITICAL CARE MEDICINE   Name: Alvin Allen MRN: [id number] DOB: May 11, 1961    ADMISSION DATE:  05/17/2015 CONSULTATION DATE:  05/17/2015  REFERRING MD :  Trauma  CHIEF COMPLAINT:  Found down  INITIAL PRESENTATION:  54 y.o. M brought to [facility] ED 9/8 after being found down by daughter.  In ED, he had moderately elevated CK (4650) therefore PCCM was called due to concerns of rhabdo.   STUDIES:  CT head / maxillofacial / C-Spine 9/8 >>> large soft tissue hematoma over left frontal bone.  Nondisplaced fracture inferior leftfrontal bone extending into the superior anterior orbital rim. Nondisplaced fx's of anterior superior left orbital rim as well as posterior left maxillary sinus wall.  Extensive paranasal sinus disease.  Soft tissue swelling noted over both upper facial and preseptal orbital regions.  No acute C spine fx's. Pelvis XR 9/8 >>> no acute process. CXR 9/8 >>> no acute process.  SIGNIFICANT EVENTS: 9/8 - admit.   HISTORY OF PRESENT ILLNESS:  Pt is encephelopathic; therefore, this HPI is obtained from chart review.  Alvin Allen is a 54 y.o. M with PMH of OSA, allergic rhinitis, anemia, arthritis, atypical chest pain, BPH, bipolar disorder, insomnia, nicotine dependence, obesity, sialadenitis.  He presented to [facility] ED 9/8 as a trauma after being found down at the bottom of the stairs by his daughter.  He had GCS 10 on the scene.  Per his wife, he was last seen normal at around 9-10am and at the time he was walking in the home although he seemed groggy.  Due to his insomnia and the fact that he hadn't slept in 3 - 4 days, family didn't think much of it.  Daughter returned home around 5pm and that is when she noticed pt on the floor.  In ED, he was alert and oriented to person but not place or time.  Imaging revealed multiple maxillofacial fractures (see above for details).  Lab work significant for trop of 1.52, CK of 4650.  PCCM was consulted for medical management due to  concern for rhabdo.  SCr on admit 0.90, UA is pending.  UDS positive for THC.   PAST MEDICAL HISTORY :   OSA, allergic rhinitis, anemia, arthritis, atypical chest pain, BPH, bipolar disorder, insomnia, nicotine dependence, obesity, sialadenitis.    has no past medical history on file.  has no past surgical history on file. Prior to Admission medications   Not on File   No Known Allergies  FAMILY HISTORY:  No family history on file.  SOCIAL HISTORY:  has no tobacco, alcohol, and drug history on file.  REVIEW OF SYSTEMS:  Unable to obtain as pt is encephalopathic.  SUBJECTIVE:   VITAL SIGNS: Pulse Rate:  [67-93] 87 (09/08 2115) Resp:  [13-22] 18 (09/08 2115) BP: (0-135)/(0-109) 135/96 mmHg (09/08 2115) SpO2:  [94 %-98 %] 96 % (09/08 2115) HEMODYNAMICS:   VENTILATOR SETTINGS:   INTAKE / OUTPUT: Intake/Output      09/08 0701 - 09/09 0700   I.V. 0   Total Intake 0   Urine 1000   Total Output 1000   Net -1000         PHYSICAL EXAMINATION: General: Caucasian male, in NAD. Neuro: Somnolent but easily arousable. No focal deficits. HEENT: Raccoon eyes, abrasion to right eye lid.  Dried blood in nares.  C-collar in place. Cardiovascular: RRR, no M/R/G.  Lungs: Respirations even and unlabored.  CTA bilaterally, No W/R/R. Abdomen: BS x 4, soft, NT/ND.  Musculoskeletal: No gross  deformities, no edema.  Skin:  Hematoma to left scalp.   Echhymosis to face with racoon eyes.  Dried blood in nares.  LABS:  CBC  Recent Labs Lab 05/17/15 1812 05/17/15 1844  WBC 12.2*  --   HGB 13.1 13.9  HCT 39.0 41.0  PLT 277  --    Coag's  Recent Labs Lab 05/17/15 1812  INR 1.05   BMET  Recent Labs Lab 05/17/15 1812 05/17/15 1844  NA 139 137  K 3.8 5.7*  CL 106 103  CO2 26  --   BUN 15 25*  CREATININE 0.86 0.90  GLUCOSE 140* 128*   Electrolytes  Recent Labs Lab 05/17/15 1812  CALCIUM 8.7*   Sepsis Markers  Recent Labs Lab 05/17/15 1821  LATICACIDVEN 1.7    ABG No results for input(s): PHART, PCO2ART, PO2ART in the last 168 hours. Liver Enzymes  Recent Labs Lab 05/17/15 1812  AST 108*  ALT 66*  ALKPHOS 82  BILITOT 0.9  ALBUMIN 3.5   Cardiac Enzymes  Recent Labs Lab 05/17/15 1812  TROPONINI 1.52*   Glucose  Recent Labs Lab 05/17/15 1758  GLUCAP 131*    Imaging Ct Head Wo Contrast  05/17/2015   CLINICAL DATA:  Patient fell down stairs  EXAM: CT HEAD WITHOUT CONTRAST  CT MAXILLOFACIAL WITHOUT CONTRAST  CT CERVICAL SPINE WITHOUT CONTRAST  TECHNIQUE: Multidetector CT imaging of the head, cervical spine, and maxillofacial structures were performed using the standard protocol without intravenous contrast. Multiplanar CT image reconstructions of the cervical spine and maxillofacial structures were also generated.  COMPARISON:  None.  FINDINGS: CT HEAD FINDINGS  The ventricles are normal in size and configuration. There is no intracranial mass hemorrhage, extra-axial fluid collection, or midline shift. The gray-white compartments appear normal. No acute infarct is evident. There is a large hematoma overlying the left frontal bone. There is a nondisplaced fracture of the anterior superior left orbital rim extending to the inferior left frontal bone with air along the superior most aspect of the orbit. This air is confined to the intraorbital region. The bony calvarium otherwise appears intact. The mastoid air cells are clear.  CT MAXILLOFACIAL FINDINGS  There is marked soft tissue swelling over the left frontal bone with acute hematoma. There is a fracture of the anterior superior left orbital rim extending into the inferior left frontal bone focal air in the superior posterior aspect of the left orbit. There is a fracture of the medial posterior left maxillary wall. No other fractures are apparent. There is no dislocation. There is no intraorbital lesion beyond the air in the superior posterior left orbit. The globes appear intact as do the  extraocular muscles and optic nerve regions.  There is soft tissue swelling over the upper face and preseptal orbital regions bilaterally.  There is extensive opacification of the left frontal sinus with air-fluid level. There is opacification of multiple ethmoid air cells bilaterally, more on the left than on the right. There is diffuse opacification of much of the left maxillary antrum with an air-fluid level. There is hemorrhage in the upper nares regions bilaterally. There is ostiomeatal unit obstruction bilaterally, presumably due to hemorrhage. There is moderate mucosal thickening in the right maxillary antrum. There is a small air-fluid level in the right sphenoid sinus.  The visualized pharyngeal air column is normal. Salivary glands appear normal. No adenopathy.  CT CERVICAL SPINE FINDINGS  There is no fracture or spondylolisthesis. Prevertebral soft tissues and predental space regions are normal. There  is moderately severe disc space narrowing at C6-7 and C7-T1. There is milder narrowing at C4-5 and C5-6. There is mild narrowing at C3-4. There is no nerve root edema or effacement. There is calcification in the posterior longitudinal ligament at C4 and C5. No demonstrable disc extrusion or high-grade stenosis.  There is alveolar opacity in each lung apex, consistent with either edema or possibly contusion.  IMPRESSION: Large soft tissue hematoma over the left frontal bone. Nondisplaced fracture inferior left frontal bone extending to the superior anterior orbital rim. No intracranial mass, hemorrhage, or extra-axial fluid collection. Gray-white compartments appear normal.  CT maxillofacial: Nondisplaced fractures of the anterior superior left orbital rim as well as the posterior left maxillary sinus wall. No other fractures are apparent. No dislocation. Extensive paranasal sinus disease. Obstruction of both ostiomeatal unit complexes, presumably due to blood. Soft tissue swelling noted over both upper  facial and preseptal orbital regions. Swelling greater on the left than on the right. More superiorly, there is a hematoma over the left frontal bone.  CT cervical spine: Areas of osteoarthritic change. Calcification in the posterior longitudinal ligament at C4 and C5. No acute fracture or spondylolisthesis. Question edema versus contusion in the lung apices bilaterally.   Electronically Signed   By: Bretta Bang III M.D.   On: 05/17/2015 19:58   Ct Cervical Spine Wo Contrast  05/17/2015   CLINICAL DATA:  Patient fell down stairs  EXAM: CT HEAD WITHOUT CONTRAST  CT MAXILLOFACIAL WITHOUT CONTRAST  CT CERVICAL SPINE WITHOUT CONTRAST  TECHNIQUE: Multidetector CT imaging of the head, cervical spine, and maxillofacial structures were performed using the standard protocol without intravenous contrast. Multiplanar CT image reconstructions of the cervical spine and maxillofacial structures were also generated.  COMPARISON:  None.  FINDINGS: CT HEAD FINDINGS  The ventricles are normal in size and configuration. There is no intracranial mass hemorrhage, extra-axial fluid collection, or midline shift. The gray-white compartments appear normal. No acute infarct is evident. There is a large hematoma overlying the left frontal bone. There is a nondisplaced fracture of the anterior superior left orbital rim extending to the inferior left frontal bone with air along the superior most aspect of the orbit. This air is confined to the intraorbital region. The bony calvarium otherwise appears intact. The mastoid air cells are clear.  CT MAXILLOFACIAL FINDINGS  There is marked soft tissue swelling over the left frontal bone with acute hematoma. There is a fracture of the anterior superior left orbital rim extending into the inferior left frontal bone focal air in the superior posterior aspect of the left orbit. There is a fracture of the medial posterior left maxillary wall. No other fractures are apparent. There is no  dislocation. There is no intraorbital lesion beyond the air in the superior posterior left orbit. The globes appear intact as do the extraocular muscles and optic nerve regions.  There is soft tissue swelling over the upper face and preseptal orbital regions bilaterally.  There is extensive opacification of the left frontal sinus with air-fluid level. There is opacification of multiple ethmoid air cells bilaterally, more on the left than on the right. There is diffuse opacification of much of the left maxillary antrum with an air-fluid level. There is hemorrhage in the upper nares regions bilaterally. There is ostiomeatal unit obstruction bilaterally, presumably due to hemorrhage. There is moderate mucosal thickening in the right maxillary antrum. There is a small air-fluid level in the right sphenoid sinus.  The visualized pharyngeal air column is  normal. Salivary glands appear normal. No adenopathy.  CT CERVICAL SPINE FINDINGS  There is no fracture or spondylolisthesis. Prevertebral soft tissues and predental space regions are normal. There is moderately severe disc space narrowing at C6-7 and C7-T1. There is milder narrowing at C4-5 and C5-6. There is mild narrowing at C3-4. There is no nerve root edema or effacement. There is calcification in the posterior longitudinal ligament at C4 and C5. No demonstrable disc extrusion or high-grade stenosis.  There is alveolar opacity in each lung apex, consistent with either edema or possibly contusion.  IMPRESSION: Large soft tissue hematoma over the left frontal bone. Nondisplaced fracture inferior left frontal bone extending to the superior anterior orbital rim. No intracranial mass, hemorrhage, or extra-axial fluid collection. Gray-white compartments appear normal.  CT maxillofacial: Nondisplaced fractures of the anterior superior left orbital rim as well as the posterior left maxillary sinus wall. No other fractures are apparent. No dislocation. Extensive paranasal  sinus disease. Obstruction of both ostiomeatal unit complexes, presumably due to blood. Soft tissue swelling noted over both upper facial and preseptal orbital regions. Swelling greater on the left than on the right. More superiorly, there is a hematoma over the left frontal bone.  CT cervical spine: Areas of osteoarthritic change. Calcification in the posterior longitudinal ligament at C4 and C5. No acute fracture or spondylolisthesis. Question edema versus contusion in the lung apices bilaterally.   Electronically Signed   By: Bretta Bang III M.D.   On: 05/17/2015 19:58   Dg Pelvis Portable  05/17/2015   CLINICAL DATA:  Trauma.  Patient fell today.  EXAM: PORTABLE PELVIS 1-2 VIEWS  COMPARISON:  None.  FINDINGS: The right iliac crest is excluded. No evidence for acute fracture or subluxation. Regional bowel gas pattern is nonobstructed. Surgical clips are noted in the right upper quadrant.  IMPRESSION: 1.  No evidence for acute  abnormality. 2. Right iliac crest excluded.   Electronically Signed   By: Norva Pavlov M.D.   On: 05/17/2015 18:43   Dg Chest Portable 1 View  05/17/2015   CLINICAL DATA:  Pain following fall  EXAM: PORTABLE CHEST - 1 VIEW  COMPARISON:  None.  FINDINGS: There is no edema or consolidation. Heart size and pulmonary vascularity are normal. No adenopathy. No bone lesions.  IMPRESSION: No edema or consolidation.  No pneumothorax.   Electronically Signed   By: Bretta Bang III M.D.   On: 05/17/2015 18:42   Ct Maxillofacial Wo Cm  05/17/2015   CLINICAL DATA:  Patient fell down stairs  EXAM: CT HEAD WITHOUT CONTRAST  CT MAXILLOFACIAL WITHOUT CONTRAST  CT CERVICAL SPINE WITHOUT CONTRAST  TECHNIQUE: Multidetector CT imaging of the head, cervical spine, and maxillofacial structures were performed using the standard protocol without intravenous contrast. Multiplanar CT image reconstructions of the cervical spine and maxillofacial structures were also generated.  COMPARISON:  None.   FINDINGS: CT HEAD FINDINGS  The ventricles are normal in size and configuration. There is no intracranial mass hemorrhage, extra-axial fluid collection, or midline shift. The gray-white compartments appear normal. No acute infarct is evident. There is a large hematoma overlying the left frontal bone. There is a nondisplaced fracture of the anterior superior left orbital rim extending to the inferior left frontal bone with air along the superior most aspect of the orbit. This air is confined to the intraorbital region. The bony calvarium otherwise appears intact. The mastoid air cells are clear.  CT MAXILLOFACIAL FINDINGS  There is marked soft tissue swelling over  the left frontal bone with acute hematoma. There is a fracture of the anterior superior left orbital rim extending into the inferior left frontal bone focal air in the superior posterior aspect of the left orbit. There is a fracture of the medial posterior left maxillary wall. No other fractures are apparent. There is no dislocation. There is no intraorbital lesion beyond the air in the superior posterior left orbit. The globes appear intact as do the extraocular muscles and optic nerve regions.  There is soft tissue swelling over the upper face and preseptal orbital regions bilaterally.  There is extensive opacification of the left frontal sinus with air-fluid level. There is opacification of multiple ethmoid air cells bilaterally, more on the left than on the right. There is diffuse opacification of much of the left maxillary antrum with an air-fluid level. There is hemorrhage in the upper nares regions bilaterally. There is ostiomeatal unit obstruction bilaterally, presumably due to hemorrhage. There is moderate mucosal thickening in the right maxillary antrum. There is a small air-fluid level in the right sphenoid sinus.  The visualized pharyngeal air column is normal. Salivary glands appear normal. No adenopathy.  CT CERVICAL SPINE FINDINGS  There is  no fracture or spondylolisthesis. Prevertebral soft tissues and predental space regions are normal. There is moderately severe disc space narrowing at C6-7 and C7-T1. There is milder narrowing at C4-5 and C5-6. There is mild narrowing at C3-4. There is no nerve root edema or effacement. There is calcification in the posterior longitudinal ligament at C4 and C5. No demonstrable disc extrusion or high-grade stenosis.  There is alveolar opacity in each lung apex, consistent with either edema or possibly contusion.  IMPRESSION: Large soft tissue hematoma over the left frontal bone. Nondisplaced fracture inferior left frontal bone extending to the superior anterior orbital rim. No intracranial mass, hemorrhage, or extra-axial fluid collection. Gray-white compartments appear normal.  CT maxillofacial: Nondisplaced fractures of the anterior superior left orbital rim as well as the posterior left maxillary sinus wall. No other fractures are apparent. No dislocation. Extensive paranasal sinus disease. Obstruction of both ostiomeatal unit complexes, presumably due to blood. Soft tissue swelling noted over both upper facial and preseptal orbital regions. Swelling greater on the left than on the right. More superiorly, there is a hematoma over the left frontal bone.  CT cervical spine: Areas of osteoarthritic change. Calcification in the posterior longitudinal ligament at C4 and C5. No acute fracture or spondylolisthesis. Question edema versus contusion in the lung apices bilaterally.   Electronically Signed   By: Bretta Bang III M.D.   On: 05/17/2015 19:58    ASSESSMENT / PLAN:  NEUROLOGIC A:   Multiple maxillofacial fractures s/p presumed fall 9/8.   Consider syncopal event. ? Hx seizure disorder - not on problem list but has depakote on med list. Hx bipolar disorder, insomnia. Substance abuse - UDS positive for THC. P:   Management per trauma / maxillofacial surgery. Continue outpatient Depakote, change  to IV form. If needed, continue outpatient temazepam and trazodone (Defer ordering for now given his somnolence, can add if pt unable to sleep). Hold outpatient clonazepam, eszopiclone, gabapentin, lamictal. Substance abuse counseling.  RENAL A:   Mild rhabdomyolysis - CK 4650, no UA to assess yet. Hyperkalemia. Hypocalcemia. P:   Obtain UA. Repeat CK in AM. Kayexalate per rectum x 1. NS @ 125. BMP in AM.  PULMONARY A: OSA - no reported CPAP use. Nicotine dependence / tobacco use disorder. P:   Pulmonary hygiene. Nicotine  patch. Tobacco cessation counseling.  CARDIOVASCULAR A:  Troponin leak - suspect demand ischemia from prolonged downtime. Hx atypical chest pain. P:  Trend troponin. Repeat EKG. Cardiology consulted. Defer anticoagulation to cardiology.  GASTROINTESTINAL A:   Nutrition. P:   NPO.  HEMATOLOGIC A:   VTE Prophylaxis. P:  SCD's. CBC in AM.  INFECTIOUS A:   Mild leukocytosis - likely acute phase reactant; no indication of infection. P:   Monitor clinically.  ENDOCRINE A:   Mild hyperglycemia. P:   SSI if glucose consistently > 180.   Family updated: Wife at bedside.  Interdisciplinary Family Meeting v Palliative Care Meeting:  Due by: 9/14.  CC time:  35 minutes.   Rutherford Guys, [place] [facility] Pager: [phone]  or [phone] 05/17/2015, 9:52 PM

## 2015-05-17 NOTE — ED Provider Notes (Signed)
CSN: [id number]     Arrival date & time 05/17/15  1746 History   First MD Initiated Contact with Patient 05/17/15 1749     Chief Complaint  Patient presents with  . Trauma   Patient is a 54 y.o. male presenting with general illness. The history is provided by the EMS personnel and a relative. The history is limited by the condition of the patient. No language interpreter was used.  Illness Location:  Head Quality:  Trauma s/p fall Severity:  Moderate Onset quality:  Sudden Duration: Unknown. Timing:  Constant Progression:  Unchanged Chronicity:  New Context:  Level II trauma s/p fall. patient's daughter left for school at 7am and returned home at 5pm to find patient at bottom stairs. Called EMS. Reportedly had GCS 10 on scene and was combative. HDS en route.   No past medical history on file. No past surgical history on file. No family history on file. Social History  Substance Use Topics  . Smoking status: Not on file  . Smokeless tobacco: Not on file  . Alcohol Use: Not on file    Review of Systems  Unable to perform ROS: Mental status change    Allergies  Review of patient's allergies indicates no known allergies.  Home Medications   Prior to Admission medications   Not on File   BP 129/92 mmHg  Pulse 89  Temp(Src) 98.1 F (36.7 C) (Oral)  Resp 17  Ht 5\' 10"  (1.778 m)  Wt 183 lb 6.8 oz (83.2 kg)  BMI 26.32 kg/m2  SpO2 96%   Physical Exam  Constitutional: No distress.  HENT:  Hematoma to forehead. Bilateral periorbital ecchymoses. Pupils 3 mm and reactive bilaterally. EOMI  Eyes: Conjunctivae are normal. Pupils are equal, round, and reactive to light.  Neck: Neck supple. No tracheal deviation present.   C-collar in place. Trachea midline. Neck without crepitus.  Cardiovascular: Normal rate, regular rhythm and intact distal pulses.   Pulmonary/Chest: Effort normal and breath sounds normal.  Abdominal: Soft. Bowel sounds are normal. He exhibits no  distension. There is no tenderness. There is no rebound.  Musculoskeletal: He exhibits edema.  Edema to bilateral lower extremities level of proximal shin which appears chronic  Neurological:  GCS 13, following commands, opening eyes to voice, oriented to self only  Skin: Skin is warm and dry. He is not diaphoretic.  Nursing note and vitals reviewed.   ED Course  Procedures   Labs Review Labs Reviewed  CBC WITH DIFFERENTIAL/PLATELET - Abnormal; Notable for the following:    WBC 12.2 (*)    RBC 4.02 (*)    Neutrophils Relative % 87 (*)    Neutro Abs 10.6 (*)    Lymphocytes Relative 8 (*)    All other components within normal limits  CK TOTAL AND CKMB (NOT AT [facility]) - Abnormal; Notable for the following:    Total CK 4650 (*)    CK, MB 85.2 (*)    All other components within normal limits  COMPREHENSIVE METABOLIC PANEL - Abnormal; Notable for the following:    Glucose, Bld 140 (*)    Calcium 8.7 (*)    Total Protein 6.4 (*)    AST 108 (*)    ALT 66 (*)    All other components within normal limits  URINE RAPID DRUG SCREEN, HOSP PERFORMED - Abnormal; Notable for the following:    Tetrahydrocannabinol POSITIVE (*)    All other components within normal limits  TROPONIN I - Abnormal; Notable for  the following:    Troponin I 1.52 (*)    All other components within normal limits  TROPONIN I - Abnormal; Notable for the following:    Troponin I 1.82 (*)    All other components within normal limits  URINALYSIS, ROUTINE W REFLEX MICROSCOPIC (NOT AT [facility] - Abnormal; Notable for the following:    APPearance CLOUDY (*)    Hgb urine dipstick MODERATE (*)    Leukocytes, UA TRACE (*)    All other components within normal limits  CBG MONITORING, ED - Abnormal; Notable for the following:    Glucose-Capillary 131 (*)    All other components within normal limits  I-STAT CHEM 8, ED - Abnormal; Notable for the following:    Potassium 5.7 (*)    BUN 25 (*)    Glucose, Bld 128 (*)     Calcium, Ion 1.11 (*)    All other components within normal limits  PROTIME-INR  ETHANOL  LACTIC ACID, PLASMA  URINE MICROSCOPIC-ADD ON  CDS SEROLOGY  CBC  BASIC METABOLIC PANEL  TROPONIN I  TROPONIN I  CK  SAMPLE TO BLOOD BANK   Imaging Review Ct Head Wo Contrast  05/17/2015   CLINICAL DATA:  Patient fell down stairs  EXAM: CT HEAD WITHOUT CONTRAST  CT MAXILLOFACIAL WITHOUT CONTRAST  CT CERVICAL SPINE WITHOUT CONTRAST  TECHNIQUE: Multidetector CT imaging of the head, cervical spine, and maxillofacial structures were performed using the standard protocol without intravenous contrast. Multiplanar CT image reconstructions of the cervical spine and maxillofacial structures were also generated.  COMPARISON:  None.  FINDINGS: CT HEAD FINDINGS  The ventricles are normal in size and configuration. There is no intracranial mass hemorrhage, extra-axial fluid collection, or midline shift. The gray-white compartments appear normal. No acute infarct is evident. There is a large hematoma overlying the left frontal bone. There is a nondisplaced fracture of the anterior superior left orbital rim extending to the inferior left frontal bone with air along the superior most aspect of the orbit. This air is confined to the intraorbital region. The bony calvarium otherwise appears intact. The mastoid air cells are clear.  CT MAXILLOFACIAL FINDINGS  There is marked soft tissue swelling over the left frontal bone with acute hematoma. There is a fracture of the anterior superior left orbital rim extending into the inferior left frontal bone focal air in the superior posterior aspect of the left orbit. There is a fracture of the medial posterior left maxillary wall. No other fractures are apparent. There is no dislocation. There is no intraorbital lesion beyond the air in the superior posterior left orbit. The globes appear intact as do the extraocular muscles and optic nerve regions.  There is soft tissue swelling over the  upper face and preseptal orbital regions bilaterally.  There is extensive opacification of the left frontal sinus with air-fluid level. There is opacification of multiple ethmoid air cells bilaterally, more on the left than on the right. There is diffuse opacification of much of the left maxillary antrum with an air-fluid level. There is hemorrhage in the upper nares regions bilaterally. There is ostiomeatal unit obstruction bilaterally, presumably due to hemorrhage. There is moderate mucosal thickening in the right maxillary antrum. There is a small air-fluid level in the right sphenoid sinus.  The visualized pharyngeal air column is normal. Salivary glands appear normal. No adenopathy.  CT CERVICAL SPINE FINDINGS  There is no fracture or spondylolisthesis. Prevertebral soft tissues and predental space regions are normal. There is moderately severe  disc space narrowing at C6-7 and C7-T1. There is milder narrowing at C4-5 and C5-6. There is mild narrowing at C3-4. There is no nerve root edema or effacement. There is calcification in the posterior longitudinal ligament at C4 and C5. No demonstrable disc extrusion or high-grade stenosis.  There is alveolar opacity in each lung apex, consistent with either edema or possibly contusion.  IMPRESSION: Large soft tissue hematoma over the left frontal bone. Nondisplaced fracture inferior left frontal bone extending to the superior anterior orbital rim. No intracranial mass, hemorrhage, or extra-axial fluid collection. Gray-white compartments appear normal.  CT maxillofacial: Nondisplaced fractures of the anterior superior left orbital rim as well as the posterior left maxillary sinus wall. No other fractures are apparent. No dislocation. Extensive paranasal sinus disease. Obstruction of both ostiomeatal unit complexes, presumably due to blood. Soft tissue swelling noted over both upper facial and preseptal orbital regions. Swelling greater on the left than on the right. More  superiorly, there is a hematoma over the left frontal bone.  CT cervical spine: Areas of osteoarthritic change. Calcification in the posterior longitudinal ligament at C4 and C5. No acute fracture or spondylolisthesis. Question edema versus contusion in the lung apices bilaterally.   Electronically Signed   By: Bretta Bang III M.D.   On: 05/17/2015 19:58   Ct Cervical Spine Wo Contrast  05/17/2015   CLINICAL DATA:  Patient fell down stairs  EXAM: CT HEAD WITHOUT CONTRAST  CT MAXILLOFACIAL WITHOUT CONTRAST  CT CERVICAL SPINE WITHOUT CONTRAST  TECHNIQUE: Multidetector CT imaging of the head, cervical spine, and maxillofacial structures were performed using the standard protocol without intravenous contrast. Multiplanar CT image reconstructions of the cervical spine and maxillofacial structures were also generated.  COMPARISON:  None.  FINDINGS: CT HEAD FINDINGS  The ventricles are normal in size and configuration. There is no intracranial mass hemorrhage, extra-axial fluid collection, or midline shift. The gray-white compartments appear normal. No acute infarct is evident. There is a large hematoma overlying the left frontal bone. There is a nondisplaced fracture of the anterior superior left orbital rim extending to the inferior left frontal bone with air along the superior most aspect of the orbit. This air is confined to the intraorbital region. The bony calvarium otherwise appears intact. The mastoid air cells are clear.  CT MAXILLOFACIAL FINDINGS  There is marked soft tissue swelling over the left frontal bone with acute hematoma. There is a fracture of the anterior superior left orbital rim extending into the inferior left frontal bone focal air in the superior posterior aspect of the left orbit. There is a fracture of the medial posterior left maxillary wall. No other fractures are apparent. There is no dislocation. There is no intraorbital lesion beyond the air in the superior posterior left orbit. The  globes appear intact as do the extraocular muscles and optic nerve regions.  There is soft tissue swelling over the upper face and preseptal orbital regions bilaterally.  There is extensive opacification of the left frontal sinus with air-fluid level. There is opacification of multiple ethmoid air cells bilaterally, more on the left than on the right. There is diffuse opacification of much of the left maxillary antrum with an air-fluid level. There is hemorrhage in the upper nares regions bilaterally. There is ostiomeatal unit obstruction bilaterally, presumably due to hemorrhage. There is moderate mucosal thickening in the right maxillary antrum. There is a small air-fluid level in the right sphenoid sinus.  The visualized pharyngeal air column is normal. Salivary glands  appear normal. No adenopathy.  CT CERVICAL SPINE FINDINGS  There is no fracture or spondylolisthesis. Prevertebral soft tissues and predental space regions are normal. There is moderately severe disc space narrowing at C6-7 and C7-T1. There is milder narrowing at C4-5 and C5-6. There is mild narrowing at C3-4. There is no nerve root edema or effacement. There is calcification in the posterior longitudinal ligament at C4 and C5. No demonstrable disc extrusion or high-grade stenosis.  There is alveolar opacity in each lung apex, consistent with either edema or possibly contusion.  IMPRESSION: Large soft tissue hematoma over the left frontal bone. Nondisplaced fracture inferior left frontal bone extending to the superior anterior orbital rim. No intracranial mass, hemorrhage, or extra-axial fluid collection. Gray-white compartments appear normal.  CT maxillofacial: Nondisplaced fractures of the anterior superior left orbital rim as well as the posterior left maxillary sinus wall. No other fractures are apparent. No dislocation. Extensive paranasal sinus disease. Obstruction of both ostiomeatal unit complexes, presumably due to blood. Soft tissue  swelling noted over both upper facial and preseptal orbital regions. Swelling greater on the left than on the right. More superiorly, there is a hematoma over the left frontal bone.  CT cervical spine: Areas of osteoarthritic change. Calcification in the posterior longitudinal ligament at C4 and C5. No acute fracture or spondylolisthesis. Question edema versus contusion in the lung apices bilaterally.   Electronically Signed   By: Bretta Bang III M.D.   On: 05/17/2015 19:58   Dg Pelvis Portable  05/17/2015   CLINICAL DATA:  Trauma.  Patient fell today.  EXAM: PORTABLE PELVIS 1-2 VIEWS  COMPARISON:  None.  FINDINGS: The right iliac crest is excluded. No evidence for acute fracture or subluxation. Regional bowel gas pattern is nonobstructed. Surgical clips are noted in the right upper quadrant.  IMPRESSION: 1.  No evidence for acute  abnormality. 2. Right iliac crest excluded.   Electronically Signed   By: Norva Pavlov M.D.   On: 05/17/2015 18:43   Dg Chest Portable 1 View  05/17/2015   CLINICAL DATA:  Pain following fall  EXAM: PORTABLE CHEST - 1 VIEW  COMPARISON:  None.  FINDINGS: There is no edema or consolidation. Heart size and pulmonary vascularity are normal. No adenopathy. No bone lesions.  IMPRESSION: No edema or consolidation.  No pneumothorax.   Electronically Signed   By: Bretta Bang III M.D.   On: 05/17/2015 18:42   Ct Maxillofacial Wo Cm  05/17/2015   CLINICAL DATA:  Patient fell down stairs  EXAM: CT HEAD WITHOUT CONTRAST  CT MAXILLOFACIAL WITHOUT CONTRAST  CT CERVICAL SPINE WITHOUT CONTRAST  TECHNIQUE: Multidetector CT imaging of the head, cervical spine, and maxillofacial structures were performed using the standard protocol without intravenous contrast. Multiplanar CT image reconstructions of the cervical spine and maxillofacial structures were also generated.  COMPARISON:  None.  FINDINGS: CT HEAD FINDINGS  The ventricles are normal in size and configuration. There is no  intracranial mass hemorrhage, extra-axial fluid collection, or midline shift. The gray-white compartments appear normal. No acute infarct is evident. There is a large hematoma overlying the left frontal bone. There is a nondisplaced fracture of the anterior superior left orbital rim extending to the inferior left frontal bone with air along the superior most aspect of the orbit. This air is confined to the intraorbital region. The bony calvarium otherwise appears intact. The mastoid air cells are clear.  CT MAXILLOFACIAL FINDINGS  There is marked soft tissue swelling over the left frontal  bone with acute hematoma. There is a fracture of the anterior superior left orbital rim extending into the inferior left frontal bone focal air in the superior posterior aspect of the left orbit. There is a fracture of the medial posterior left maxillary wall. No other fractures are apparent. There is no dislocation. There is no intraorbital lesion beyond the air in the superior posterior left orbit. The globes appear intact as do the extraocular muscles and optic nerve regions.  There is soft tissue swelling over the upper face and preseptal orbital regions bilaterally.  There is extensive opacification of the left frontal sinus with air-fluid level. There is opacification of multiple ethmoid air cells bilaterally, more on the left than on the right. There is diffuse opacification of much of the left maxillary antrum with an air-fluid level. There is hemorrhage in the upper nares regions bilaterally. There is ostiomeatal unit obstruction bilaterally, presumably due to hemorrhage. There is moderate mucosal thickening in the right maxillary antrum. There is a small air-fluid level in the right sphenoid sinus.  The visualized pharyngeal air column is normal. Salivary glands appear normal. No adenopathy.  CT CERVICAL SPINE FINDINGS  There is no fracture or spondylolisthesis. Prevertebral soft tissues and predental space regions are  normal. There is moderately severe disc space narrowing at C6-7 and C7-T1. There is milder narrowing at C4-5 and C5-6. There is mild narrowing at C3-4. There is no nerve root edema or effacement. There is calcification in the posterior longitudinal ligament at C4 and C5. No demonstrable disc extrusion or high-grade stenosis.  There is alveolar opacity in each lung apex, consistent with either edema or possibly contusion.  IMPRESSION: Large soft tissue hematoma over the left frontal bone. Nondisplaced fracture inferior left frontal bone extending to the superior anterior orbital rim. No intracranial mass, hemorrhage, or extra-axial fluid collection. Gray-white compartments appear normal.  CT maxillofacial: Nondisplaced fractures of the anterior superior left orbital rim as well as the posterior left maxillary sinus wall. No other fractures are apparent. No dislocation. Extensive paranasal sinus disease. Obstruction of both ostiomeatal unit complexes, presumably due to blood. Soft tissue swelling noted over both upper facial and preseptal orbital regions. Swelling greater on the left than on the right. More superiorly, there is a hematoma over the left frontal bone.  CT cervical spine: Areas of osteoarthritic change. Calcification in the posterior longitudinal ligament at C4 and C5. No acute fracture or spondylolisthesis. Question edema versus contusion in the lung apices bilaterally.   Electronically Signed   By: Bretta Bang III M.D.   On: 05/17/2015 19:58   I have personally reviewed and evaluated these images and lab results as part of my medical decision-making.   EKG Interpretation   Date/Time:  Thursday May 17 2015 18:55:13 EDT Ventricular Rate:  80 PR Interval:  170 QRS Duration: 118 QT Interval:  462 QTC Calculation: 533 R Axis:   117 Text Interpretation:  Sinus rhythm Nonspecific intraventricular conduction  delay Abnormal T, consider ischemia, lateral leads Borderline ST   elevation, anterior leads no acute changes Confirmed by Rhunette Croft, MD,  Janey Genta [phone]) on 05/17/2015 7:00:49 PM      MDM  54 year old male presenting via EMS as level II trauma s/p fall. patient's daughter left for school at 7am and returned home at 5pm to find patient at bottom stairs. Called EMS. Reportedly had GCS 10 on scene and was combative. HDS en route.  Exam above notable for middle-age male lying in stretcher. Appears to be intoxicated.  Hypertensive. Heart rate 80s. Hematoma to forehead. Bilateral periorbital ecchymoses. Pupils 3 mm and reactive bilaterally. EOMI. C-collar in place. No obvious deformity to person or extremity. Chest stable to anterolateral compression. Abdomen soft and nondistended. Trachea midline. Neck without crepitus. Edema to bilateral lower extremities level of proximal shin which appears chronic.  EKG showing no ST elevation/depression. First troponin 1.5. CK 4600 - multiple boluses of IV fluids given in the ED. Creatinine normal. UA showing trace leukocytes but negative nitrites. Chem-8 relatively unremarkable. UDS positive for THC. Lactic acid 1.7. CT head showing fracture of the left frontal bone extending to the superior anterior orbital rim as well as fracture of the left posterior maxillary sinus wall but no acute intracranial abnormality. CT cervical spine showing no acute fracture or malalignment.  Patient admitted to trauma for further evaluation and management of rhabdomyolysis and traumatic facial fractures in setting of fall. Patient's family understands and agrees with plan and has no further questions or concerns at this time.  Patient care discussed with and followed by attending, Dr. Derwood Kaplan   Final diagnoses:  Elevated troponin  Traumatic rhabdomyolysis, initial encounter  Concussion w/o coma, initial encounter  Glasgow coma scale total score 13-15    Angelina Ok, MD 05/18/15 1610  Derwood Kaplan, MD 05/18/15 [id number]

## 2015-05-17 NOTE — ED Notes (Signed)
Pt continuously moans in pain

## 2015-05-17 NOTE — ED Notes (Signed)
Cardiology on phone with dr toth.

## 2015-05-17 NOTE — ED Notes (Signed)
Family brought to bedside and updated on plan of care.

## 2015-05-18 ENCOUNTER — Inpatient Hospital Stay (HOSPITAL_COMMUNITY): Payer: BC Managed Care – PPO

## 2015-05-18 ENCOUNTER — Encounter (HOSPITAL_COMMUNITY): Payer: Self-pay | Admitting: *Deleted

## 2015-05-18 DIAGNOSIS — W19XXXA Unspecified fall, initial encounter: Secondary | ICD-10-CM | POA: Diagnosis present

## 2015-05-18 DIAGNOSIS — E875 Hyperkalemia: Secondary | ICD-10-CM

## 2015-05-18 DIAGNOSIS — R7989 Other specified abnormal findings of blood chemistry: Secondary | ICD-10-CM

## 2015-05-18 DIAGNOSIS — I4581 Long QT syndrome: Secondary | ICD-10-CM

## 2015-05-18 DIAGNOSIS — T796XXD Traumatic ischemia of muscle, subsequent encounter: Secondary | ICD-10-CM

## 2015-05-18 DIAGNOSIS — D62 Acute posthemorrhagic anemia: Secondary | ICD-10-CM | POA: Diagnosis not present

## 2015-05-18 DIAGNOSIS — R9431 Abnormal electrocardiogram [ECG] [EKG]: Secondary | ICD-10-CM

## 2015-05-18 DIAGNOSIS — Z8781 Personal history of (healed) traumatic fracture: Secondary | ICD-10-CM

## 2015-05-18 DIAGNOSIS — S0292XA Unspecified fracture of facial bones, initial encounter for closed fracture: Secondary | ICD-10-CM | POA: Diagnosis present

## 2015-05-18 DIAGNOSIS — M6282 Rhabdomyolysis: Secondary | ICD-10-CM

## 2015-05-18 DIAGNOSIS — R404 Transient alteration of awareness: Secondary | ICD-10-CM

## 2015-05-18 DIAGNOSIS — S0291XA Unspecified fracture of skull, initial encounter for closed fracture: Secondary | ICD-10-CM

## 2015-05-18 HISTORY — DX: Personal history of (healed) traumatic fracture: Z87.81

## 2015-05-18 HISTORY — DX: Rhabdomyolysis: M62.82

## 2015-05-18 HISTORY — DX: Unspecified fracture of skull, initial encounter for closed fracture: S02.91XA

## 2015-05-18 LAB — BASIC METABOLIC PANEL
ANION GAP: 6 (ref 5–15)
BUN: 9 mg/dL (ref 6–20)
CHLORIDE: 111 mmol/L (ref 101–111)
CO2: 26 mmol/L (ref 22–32)
Calcium: 8.3 mg/dL — ABNORMAL LOW (ref 8.9–10.3)
Creatinine, Ser: 0.71 mg/dL (ref 0.61–1.24)
GFR calc Af Amer: >60 mL/min (ref >60)
GLUCOSE: 93 mg/dL (ref 65–99)
POTASSIUM: 3.5 mmol/L (ref 3.5–5.1)
Sodium: 143 mmol/L (ref 135–145)

## 2015-05-18 LAB — HEPATIC FUNCTION PANEL
ALBUMIN: 2.9 g/dL — AB (ref 3.5–5.0)
ALT: 53 U/L (ref 17–63)
AST: 98 U/L — ABNORMAL HIGH (ref 15–41)
Alkaline Phosphatase: 68 U/L (ref 38–126)
BILIRUBIN TOTAL: 0.5 mg/dL (ref 0.3–1.2)
Total Protein: 5.5 g/dL — ABNORMAL LOW (ref 6.5–8.1)

## 2015-05-18 LAB — CBC
HEMATOCRIT: 32.7 % — AB (ref 39.0–52.0)
Hemoglobin: 11 g/dL — ABNORMAL LOW (ref 13.0–17.0)
MCH: 32.3 pg (ref 26.0–34.0)
MCHC: 33.6 g/dL (ref 30.0–36.0)
MCV: 95.9 fL (ref 78.0–100.0)
PLATELETS: 258 10*3/uL (ref 150–400)
RBC: 3.41 MIL/uL — AB (ref 4.22–5.81)
RDW: 13.3 % (ref 11.5–15.5)
WBC: 8.1 10*3/uL (ref 4.0–10.5)

## 2015-05-18 LAB — CDS SEROLOGY

## 2015-05-18 LAB — CK: CK TOTAL: 2924 U/L — AB (ref 49–397)

## 2015-05-18 LAB — MAGNESIUM: MAGNESIUM: 1.6 mg/dL — AB (ref 1.7–2.4)

## 2015-05-18 LAB — SAMPLE TO BLOOD BANK

## 2015-05-18 LAB — TROPONIN I
Troponin I: 2.51 ng/mL (ref <0.031)
Troponin I: 2.57 ng/mL (ref <0.031)

## 2015-05-18 MED ORDER — METOPROLOL TARTRATE 12.5 MG HALF TABLET
12.5000 mg | ORAL_TABLET | Freq: Two times a day (BID) | ORAL | Status: DC
Start: 2015-05-18 — End: 2015-05-19
  Administered 2015-05-18 – 2015-05-19 (×3): 12.5 mg via ORAL
  Filled 2015-05-18 (×4): qty 1

## 2015-05-18 MED ORDER — LEVOMILNACIPRAN HCL ER 20 MG PO CP24
20.0000 mg | ORAL_CAPSULE | Freq: Every day | ORAL | Status: DC
  Administered 2015-05-18: 20 mg via ORAL
  Filled 2015-05-18: qty 1

## 2015-05-18 MED ORDER — LAMOTRIGINE 200 MG PO TABS
200.0000 mg | ORAL_TABLET | Freq: Every day | ORAL | Status: DC
  Administered 2015-05-18 – 2015-05-19 (×2): 200 mg via ORAL
  Filled 2015-05-18: qty 1
  Filled 2015-05-18: qty 2
  Filled 2015-05-18: qty 1

## 2015-05-18 MED ORDER — MORPHINE SULFATE (PF) 2 MG/ML IV SOLN: 2.0000 mg | INTRAVENOUS | Status: DC | PRN

## 2015-05-18 MED ORDER — ENSURE ENLIVE PO LIQD
237.0000 mL | Freq: Two times a day (BID) | ORAL | Status: DC
  Administered 2015-05-18 – 2015-05-19 (×3): 237 mL via ORAL

## 2015-05-18 MED ORDER — HYDROCODONE-ACETAMINOPHEN 10-325 MG PO TABS
0.5000 | ORAL_TABLET | ORAL | Status: DC | PRN
  Administered 2015-05-18 – 2015-05-19 (×2): 1 via ORAL
  Administered 2015-05-19: 2 via ORAL
  Filled 2015-05-18: qty 2
  Filled 2015-05-18 (×2): qty 1

## 2015-05-18 MED ORDER — DIVALPROEX SODIUM 125 MG PO CSDR: 1000.0000 mg | DELAYED_RELEASE_CAPSULE | Freq: Every day | ORAL | Status: DC

## 2015-05-18 MED ORDER — TRIAZOLAM 0.125 MG PO TABS: 0.2500 mg | ORAL_TABLET | Freq: Every evening | ORAL | Status: DC | PRN

## 2015-05-18 MED ORDER — ASPIRIN 81 MG PO CHEW
81.0000 mg | CHEWABLE_TABLET | Freq: Every day | ORAL | Status: DC
  Administered 2015-05-18 – 2015-05-19 (×2): 81 mg via ORAL
  Filled 2015-05-18 (×2): qty 1

## 2015-05-18 MED ORDER — INFLUENZA VAC SPLIT QUAD 0.5 ML IM SUSY
0.5000 mL | PREFILLED_SYRINGE | Freq: Once | INTRAMUSCULAR | Status: AC
Start: 2015-05-18 — End: 2015-05-18
  Administered 2015-05-18: 0.5 mL via INTRAMUSCULAR
  Filled 2015-05-18: qty 0.5

## 2015-05-18 MED ORDER — MAGNESIUM SULFATE 2 GM/50ML IV SOLN
2.0000 g | Freq: Once | INTRAVENOUS | Status: AC
  Administered 2015-05-18: 2 g via INTRAVENOUS
  Filled 2015-05-18: qty 50

## 2015-05-18 MED ORDER — LEVOMILNACIPRAN HCL ER 20 MG PO CP24
80.0000 mg | ORAL_CAPSULE | Freq: Every day | ORAL | Status: DC
  Filled 2015-05-18: qty 1

## 2015-05-18 MED ORDER — CLONAZEPAM 0.5 MG PO TABS: 0.5000 mg | ORAL_TABLET | Freq: Two times a day (BID) | ORAL | Status: DC | PRN

## 2015-05-18 MED ORDER — ENOXAPARIN SODIUM 40 MG/0.4ML ~~LOC~~ SOLN
40.0000 mg | SUBCUTANEOUS | Status: DC
  Administered 2015-05-18 – 2015-05-19 (×2): 40 mg via SUBCUTANEOUS
  Filled 2015-05-18 (×3): qty 0.4

## 2015-05-18 MED ORDER — BUPROPION HCL ER (SMOKING DET) 150 MG PO TB12: 150.0000 mg | ORAL_TABLET | Freq: Every day | ORAL | Status: DC

## 2015-05-18 NOTE — Progress Notes (Signed)
CRITICAL VALUE ALERT  Critical value received:  Troponin: 2.51  Date of notification:  05/18/2015  Time of notification: NO ONE CALLED  Critical value read back:No.  Nurse who received alert:  Viewed by Fransisco Hertz, RN   MD notified (1st page):  Dr. Tarri Glenn  Time of first page:  0255  MD notified (2nd page):  Time of second page:  Responding MD:  Dr. Tarri Glenn  Time MD responded:  [phone]  Discussed case and orders. No further orders, will continue to monitor.

## 2015-05-18 NOTE — Procedures (Signed)
ELECTROENCEPHALOGRAM REPORT  Date of Study: 05/18/2015  Patient's Name: Alvin Allen MRN: [id number] Date of Birth: Jun 23, 1961  Referring Provider: Dr. Elspeth Cho  Clinical History: This is a 54 year old man with episodes of slurred speech and balance problems. He had a fall with facial fracture.   Medications: lamoTRIgine (LAMICTAL) tablet 200 mg clonazePAM (KLONOPIN) tablet 0.5 mg aspirin chewable tablet 81 mg Levomilnacipran HCl ER CP24 20 mg metoprolol tartrate (LOPRESSOR) tablet 12.5 mg  Technical Summary: A multichannel digital EEG recording measured by the international 10-20 system with electrodes applied with paste and impedances below 5000 ohms performed in our laboratory with EKG monitoring in an awake and asleep patient.  Hyperventilation and photic stimulation were not performed.  The digital EEG was referentially recorded, reformatted, and digitally filtered in a variety of bipolar and referential montages for optimal display.    Description: The patient is awake and asleep during the recording.  During maximal wakefulness, there is a symmetric, medium voltage 10 Hz posterior dominant rhythm that attenuates with eye opening.  The record is symmetric. During drowsiness and sleep, there is an increase in theta slowing of the background.  Vertex waves and symmetric sleep spindles were seen.  Hyperventilation and photic stimulation were not performed.  There is electrode artifact at T4, in between artifact, there were no epileptiform discharges seen.  There were no epileptiform discharges or electrographic seizures seen in this study.  EKG lead was unremarkable.  Impression: This awake and asleep EEG is normal.    Clinical Correlation: A normal EEG does not exclude a clinical diagnosis of epilepsy.  Clinical correlation is advised.   Patrcia Dolly, M.D.

## 2015-05-18 NOTE — Progress Notes (Signed)
EEG completed, results pending. 

## 2015-05-18 NOTE — Consult Note (Signed)
[facility] Consult   Reason for Consult:  Bipolar depression and delirium, THC Referring Physician:  Trauma Patient Identification: Alvin Allen MRN:  [id number] Principal Diagnosis: <principal problem not specified> Diagnosis:   Patient Active Problem List   Diagnosis Date Noted  . Fall [W19.XXXA] 05/18/2015  . Multiple facial fractures [S02.92XA] 05/18/2015  . Acute blood loss anemia [D62] 05/18/2015  . Hypomagnesemia [E83.42] 05/18/2015  . QT prolongation [I45.81] 05/18/2015  . Rhabdomyolysis [M62.82] 05/17/2015  . Elevated troponin [R79.89]   . Hyperkalemia [E87.5]   . Altered mental status [R41.82]     Total Time spent with patient: 1 hour  Subjective:   Alvin Allen is a 54 y.o. male patient admitted with delirium and fall.  HPI:  Alvin Allen is a 54 yo wm with diagnosis of bipolar disorder admitted to ICU with history of fall and bruises around his face and unable to inform about the injury. Patient denied current symptoms of depression, anxiety and mania. He has no evidence of psychosis. Patient denied suicide or homicide ideation and psychosis. He reports being complaint with hie medication management and side effect of medications. He is alert and oriented to person but no place or time and he is acting erratically. Patient is awake, alert and confused. He is obsessed about going home and unable to comprehend about his current serious medical condition after several attempts by this provider and his family, wife and mother who were at bed side. He lives with his wife and has two children. UDS is positive for THC.  HPI Elements:  Location:  bipolar d/o. Quality:  poor. Severity:  recetn fall and delirium. Timing:  unknown. Duration:  few days.  Past Medical History:  Past Medical History  Diagnosis Date  . Bipolar disorder   . Insomnia     Past Surgical History  Procedure Laterality Date  . Gastric bypass  2006   Family History: No  family history on file. Social History:  History  Alcohol Use: Not on file     History  Drug Use  . 1.00 per week  . Special: Marijuana    Social History   Social History  . Marital Status: Married    Spouse Name: N/A  . Number of Children: N/A  . Years of Education: N/A   Social History Main Topics  . Smoking status: Current Every Day Smoker    Types: Cigarettes  . Smokeless tobacco: Not on file  . Alcohol Use: Not on file  . Drug Use: 1.00 per week    Special: Marijuana  . Sexual Activity: Yes   Other Topics Concern  . Not on file   Social History Narrative  . No narrative on file   Additional Social History:                          Allergies:  No Known Allergies  Labs:  Results for orders placed or performed during the hospital encounter of 05/17/15 (from the past 48 hour(s))  Sample to Blood Bank     Status: None   Collection Time: 05/17/15  5:54 PM  Result Value Ref Range   Blood Bank Specimen SAMPLE AVAILABLE FOR TESTING    Sample Expiration 05/18/2015   CBG monitoring, ED     Status: Abnormal   Collection Time: 05/17/15  5:58 PM  Result Value Ref Range   Glucose-Capillary 131 (H) 65 - 99 mg/dL  CDS serology     Status:  None   Collection Time: 05/17/15  6:12 PM  Result Value Ref Range   CDS serology specimen STAT   Protime-INR     Status: None   Collection Time: 05/17/15  6:12 PM  Result Value Ref Range   Prothrombin Time 13.9 11.6 - 15.2 seconds   INR 1.05 0.00 - 1.49  CBC WITH DIFFERENTIAL     Status: Abnormal   Collection Time: 05/17/15  6:12 PM  Result Value Ref Range   WBC 12.2 (H) 4.0 - 10.5 K/uL   RBC 4.02 (L) 4.22 - 5.81 MIL/uL   Hemoglobin 13.1 13.0 - 17.0 g/dL   HCT 39.0 39.0 - 52.0 %   MCV 97.0 78.0 - 100.0 fL   MCH 32.6 26.0 - 34.0 pg   MCHC 33.6 30.0 - 36.0 g/dL   RDW 13.3 11.5 - 15.5 %   Platelets 277 150 - 400 K/uL   Neutrophils Relative % 87 (H) 43 - 77 %   Neutro Abs 10.6 (H) 1.7 - 7.7 K/uL   Lymphocytes  Relative 8 (L) 12 - 46 %   Lymphs Abs 1.0 0.7 - 4.0 K/uL   Monocytes Relative 5 3 - 12 %   Monocytes Absolute 0.6 0.1 - 1.0 K/uL   Eosinophils Relative 0 0 - 5 %   Eosinophils Absolute 0.0 0.0 - 0.7 K/uL   Basophils Relative 0 0 - 1 %   Basophils Absolute 0.0 0.0 - 0.1 K/uL  CK total and CKMB (cardiac)not at [facility]     Status: Abnormal   Collection Time: 05/17/15  6:12 PM  Result Value Ref Range   Total CK 4650 (H) 49 - 397 U/L    Comment: RESULTS CONFIRMED BY MANUAL DILUTION   CK, MB 85.2 (H) 0.5 - 5.0 ng/mL   Relative Index 1.8 0.0 - 2.5  Ethanol     Status: None   Collection Time: 05/17/15  6:12 PM  Result Value Ref Range   Alcohol, Ethyl (B) <5 <5 mg/dL    Comment:        LOWEST DETECTABLE LIMIT FOR SERUM ALCOHOL IS 5 mg/dL FOR MEDICAL PURPOSES ONLY   Comprehensive metabolic panel     Status: Abnormal   Collection Time: 05/17/15  6:12 PM  Result Value Ref Range   Sodium 139 135 - 145 mmol/L   Potassium 3.8 3.5 - 5.1 mmol/L   Chloride 106 101 - 111 mmol/L   CO2 26 22 - 32 mmol/L   Glucose, Bld 140 (H) 65 - 99 mg/dL   BUN 15 6 - 20 mg/dL   Creatinine, Ser 0.86 0.61 - 1.24 mg/dL   Calcium 8.7 (L) 8.9 - 10.3 mg/dL   Total Protein 6.4 (L) 6.5 - 8.1 g/dL   Albumin 3.5 3.5 - 5.0 g/dL   AST 108 (H) 15 - 41 U/L   ALT 66 (H) 17 - 63 U/L   Alkaline Phosphatase 82 38 - 126 U/L   Total Bilirubin 0.9 0.3 - 1.2 mg/dL   GFR calc non Af Amer >60 >60 mL/min   GFR calc Af Amer >60 >60 mL/min    Comment: (NOTE) The eGFR has been calculated using the CKD EPI equation. This calculation has not been validated in all clinical situations. eGFR's persistently <60 mL/min signify possible Chronic Kidney Disease.    Anion gap 7 5 - 15  Troponin I     Status: Abnormal   Collection Time: 05/17/15  6:12 PM  Result Value Ref Range   Troponin  I 1.52 (HH) <0.031 ng/mL    Comment:        POSSIBLE MYOCARDIAL ISCHEMIA. SERIAL TESTING RECOMMENDED. CRITICAL RESULT CALLED TO, READ BACK BY AND  VERIFIED WITH: H ROBERTSON,RN 1854 05/17/15 D BRADLEY   Lactic acid, plasma     Status: None   Collection Time: 05/17/15  6:21 PM  Result Value Ref Range   Lactic Acid, Venous 1.7 0.5 - 2.0 mmol/L  Urine rapid drug screen (hosp performed)not at [facility]     Status: Abnormal   Collection Time: 05/17/15  6:30 PM  Result Value Ref Range   Opiates NONE DETECTED NONE DETECTED   Cocaine NONE DETECTED NONE DETECTED   Benzodiazepines NONE DETECTED NONE DETECTED   Amphetamines NONE DETECTED NONE DETECTED   Tetrahydrocannabinol POSITIVE (A) NONE DETECTED   Barbiturates NONE DETECTED NONE DETECTED    Comment:        DRUG SCREEN FOR MEDICAL PURPOSES ONLY.  IF CONFIRMATION IS NEEDED FOR ANY PURPOSE, NOTIFY LAB WITHIN 5 DAYS.        LOWEST DETECTABLE LIMITS FOR URINE DRUG SCREEN Drug Class       Cutoff (ng/mL) Amphetamine      1000 Barbiturate      200 Benzodiazepine   237 Tricyclics       628 Opiates          300 Cocaine          300 THC              50   I-Stat Chem 8, ED  (not at [facility])     Status: Abnormal   Collection Time: 05/17/15  6:44 PM  Result Value Ref Range   Sodium 137 135 - 145 mmol/L   Potassium 5.7 (H) 3.5 - 5.1 mmol/L   Chloride 103 101 - 111 mmol/L   BUN 25 (H) 6 - 20 mg/dL   Creatinine, Ser 0.90 0.61 - 1.24 mg/dL   Glucose, Bld 128 (H) 65 - 99 mg/dL   Calcium, Ion 1.11 (L) 1.12 - 1.23 mmol/L   TCO2 27 0 - 100 mmol/L   Hemoglobin 13.9 13.0 - 17.0 g/dL   HCT 41.0 39.0 - 52.0 %  Troponin I     Status: Abnormal   Collection Time: 05/17/15  9:29 PM  Result Value Ref Range   Troponin I 1.82 (HH) <0.031 ng/mL    Comment:        POSSIBLE MYOCARDIAL ISCHEMIA. SERIAL TESTING RECOMMENDED. CRITICAL VALUE NOTED.  VALUE IS CONSISTENT WITH PREVIOUSLY REPORTED AND CALLED VALUE.   Urinalysis, Routine w reflex microscopic (not at [facility])     Status: Abnormal   Collection Time: 05/17/15 11:30 PM  Result Value Ref Range   Color, Urine YELLOW YELLOW   APPearance CLOUDY (A)  CLEAR   Specific Gravity, Urine 1.010 1.005 - 1.030   pH 5.5 5.0 - 8.0   Glucose, UA NEGATIVE NEGATIVE mg/dL   Hgb urine dipstick MODERATE (A) NEGATIVE   Bilirubin Urine NEGATIVE NEGATIVE   Ketones, ur NEGATIVE NEGATIVE mg/dL   Protein, ur NEGATIVE NEGATIVE mg/dL   Urobilinogen, UA 0.2 0.0 - 1.0 mg/dL   Nitrite NEGATIVE NEGATIVE   Leukocytes, UA TRACE (A) NEGATIVE  Urine microscopic-add on     Status: None   Collection Time: 05/17/15 11:30 PM  Result Value Ref Range   WBC, UA 3-6 <3 WBC/hpf   RBC / HPF 3-6 <3 RBC/hpf  Troponin I     Status: Abnormal   Collection Time:  05/18/15 12:05 AM  Result Value Ref Range   Troponin I 2.51 (HH) <0.031 ng/mL    Comment:        POSSIBLE MYOCARDIAL ISCHEMIA. SERIAL TESTING RECOMMENDED. CRITICAL VALUE NOTED.  VALUE IS CONSISTENT WITH PREVIOUSLY REPORTED AND CALLED VALUE.   CBC     Status: Abnormal   Collection Time: 05/18/15  6:25 AM  Result Value Ref Range   WBC 8.1 4.0 - 10.5 K/uL   RBC 3.41 (L) 4.22 - 5.81 MIL/uL   Hemoglobin 11.0 (L) 13.0 - 17.0 g/dL    Comment: REPEATED TO VERIFY   HCT 32.7 (L) 39.0 - 52.0 %   MCV 95.9 78.0 - 100.0 fL   MCH 32.3 26.0 - 34.0 pg   MCHC 33.6 30.0 - 36.0 g/dL   RDW 13.3 11.5 - 15.5 %   Platelets 258 150 - 400 K/uL  Basic metabolic panel     Status: Abnormal   Collection Time: 05/18/15  6:25 AM  Result Value Ref Range   Sodium 143 135 - 145 mmol/L   Potassium 3.5 3.5 - 5.1 mmol/L   Chloride 111 101 - 111 mmol/L   CO2 26 22 - 32 mmol/L   Glucose, Bld 93 65 - 99 mg/dL   BUN 9 6 - 20 mg/dL   Creatinine, Ser 0.71 0.61 - 1.24 mg/dL   Calcium 8.3 (L) 8.9 - 10.3 mg/dL   GFR calc non Af Amer >60 >60 mL/min   GFR calc Af Amer >60 >60 mL/min    Comment: (NOTE) The eGFR has been calculated using the CKD EPI equation. This calculation has not been validated in all clinical situations. eGFR's persistently <60 mL/min signify possible Chronic Kidney Disease.    Anion gap 6 5 - 15  Troponin I     Status:  Abnormal   Collection Time: 05/18/15  6:25 AM  Result Value Ref Range   Troponin I 2.57 (HH) <0.031 ng/mL    Comment:        POSSIBLE MYOCARDIAL ISCHEMIA. SERIAL TESTING RECOMMENDED. CRITICAL VALUE NOTED.  VALUE IS CONSISTENT WITH PREVIOUSLY REPORTED AND CALLED VALUE.   CK     Status: Abnormal   Collection Time: 05/18/15  6:25 AM  Result Value Ref Range   Total CK 2924 (H) 49 - 397 U/L  Hepatic function panel     Status: Abnormal   Collection Time: 05/18/15  9:53 AM  Result Value Ref Range   Total Protein 5.5 (L) 6.5 - 8.1 g/dL   Albumin 2.9 (L) 3.5 - 5.0 g/dL   AST 98 (H) 15 - 41 U/L   ALT 53 17 - 63 U/L   Alkaline Phosphatase 68 38 - 126 U/L   Total Bilirubin 0.5 0.3 - 1.2 mg/dL   Bilirubin, Direct <0.1 (L) 0.1 - 0.5 mg/dL   Indirect Bilirubin NOT CALCULATED 0.3 - 0.9 mg/dL  Magnesium     Status: Abnormal   Collection Time: 05/18/15  9:53 AM  Result Value Ref Range   Magnesium 1.6 (L) 1.7 - 2.4 mg/dL    Vitals: Blood pressure 117/83, pulse 87, temperature 98.7 F (37.1 C), temperature source Oral, resp. rate 16, height '5\' 10"'  (1.778 m), weight 83.2 kg (183 lb 6.8 oz), SpO2 99 %.  Risk to Self: Is patient at risk for suicide?: No Risk to Others:   Prior Inpatient Therapy:   Prior Outpatient Therapy:    Current Facility-Administered Medications  Medication Dose Route Frequency Provider Last Rate Last Dose  . 0.9 %  sodium chloride infusion   Intravenous Continuous Lisette Abu, PA-C 100 mL/hr at 05/18/15 1200    . aspirin chewable tablet 81 mg  81 mg Oral Daily Dayna N Dunn, PA-C   81 mg at 05/18/15 1112  . clonazePAM (KLONOPIN) tablet 0.5 mg  0.5 mg Oral BID PRN Lisette Abu, PA-C      . enoxaparin (LOVENOX) injection 40 mg  40 mg Subcutaneous Q24H Lisette Abu, PA-C   40 mg at 05/18/15 1113  . feeding supplement (ENSURE ENLIVE) (ENSURE ENLIVE) liquid 237 mL  237 mL Oral BID BM Asencion Islam, RD   237 mL at 05/18/15 1615  . HYDROcodone-acetaminophen  (NORCO) 10-325 MG per tablet 0.5-2 tablet  0.5-2 tablet Oral Q4H PRN Lisette Abu, PA-C   1 tablet at 05/18/15 1113  . lamoTRIgine (LAMICTAL) tablet 200 mg  200 mg Oral Daily Lisette Abu, PA-C   200 mg at 05/18/15 1459  . Levomilnacipran HCl ER CP24 20 mg  20 mg Oral Daily Lisette Abu, PA-C      . metoprolol tartrate (LOPRESSOR) tablet 12.5 mg  12.5 mg Oral BID Pixie Casino, MD   12.5 mg at 05/18/15 1459  . morphine 2 MG/ML injection 2 mg  2 mg Intravenous Q4H PRN Lisette Abu, PA-C      . nicotine (NICODERM CQ - dosed in mg/24 hr) patch 7 mg  7 mg Transdermal Daily Rahul P Desai, PA-C   7 mg at 05/18/15 1126  . ondansetron (ZOFRAN) tablet 4 mg  4 mg Oral Q6H PRN Autumn Messing III, MD       Or  . ondansetron [facility]) injection 4 mg  4 mg Intravenous Q6H PRN Autumn Messing III, MD      . pantoprazole (PROTONIX) EC tablet 40 mg  40 mg Oral Daily Autumn Messing III, MD   40 mg at 05/18/15 1112   Or  . pantoprazole (PROTONIX) injection 40 mg  40 mg Intravenous Daily Autumn Messing III, MD        Musculoskeletal: Strength & Muscle Tone: within normal limits Gait & Station: unable to stand Patient leans: N/A  Psychiatric Specialty Exam: Physical Exam as per history and physical  ROS bruises on his face and eye lids swollen, confusion, poor insight, judgement, restless, hypomanic, denied chest pain and sob, nausea and vomiting. No Fever-chills, No Headache, No changes with Vision or hearing, reports vertigo No problems swallowing food or Liquids, No Chest pain, Cough or Shortness of Breath, No Abdominal pain, No Nausea or Vommitting, Bowel movements are regular, No Blood in stool or Urine, No dysuria, No new skin rashes or bruises, No new joints pains-aches,  No new weakness, tingling, numbness in any extremity, No recent weight gain or loss, No polyuria, polydypsia or polyphagia,   A full 10 point Review of Systems was done, except as stated above, all other Review of Systems  were negative.   Blood pressure 117/83, pulse 87, temperature 98.7 F (37.1 C), temperature source Oral, resp. rate 16, height '5\' 10"'  (1.778 m), weight 83.2 kg (183 lb 6.8 oz), SpO2 99 %.Body mass index is 26.32 kg/(m^2).  General Appearance: Disheveled and Guarded  Eye Contact::  Good  Speech:  Clear and Coherent, Pressured and Slurred  Volume:  Increased  Mood:  Depressed and Irritable  Affect:  Non-Congruent, Inappropriate and Labile  Thought Process:  Disorganized  Orientation:  Full (Time, Place, and Person)  Thought Content:  Obsessions, Paranoid Ideation  and Rumination  Suicidal Thoughts:  No  Homicidal Thoughts:  No  Memory:  Immediate;   Fair Recent;   Poor  Judgement:  Impaired  Insight:  Lacking  Psychomotor Activity:  Increased and Restlessness  Concentration:  Fair  Recall:  [employer] of Knowledge:Fair  Language: Good  Akathisia:  Negative  Handed:  Right  AIMS (if indicated):     Assets:  Communication Skills Desire for Improvement Financial Resources/Insurance Housing Leisure Time Resilience Social Support  ADL's:  Impaired  Cognition: Impaired,  Mild  Sleep:      Medical Decision Making: Review of Psycho-Social Stressors (1), Review or order clinical lab tests (1), Established Problem, Worsening (2), Review of Last Therapy Session (1), Review or order medicine tests (1), Review of Medication Regimen & Side Effects (2) and Review of New Medication or Change in Dosage (2)  Treatment Plan Summary: Daily contact with patient to assess and evaluate symptoms and progress in treatment and Medication management  Plan: Monitor for CK which seems to be improving.  Continue lamictal and klonopin for bipolar mood swings and agitation May use depakote when medically stable Patient does not meet criteria for psychiatric inpatient admission. Supportive therapy provided about ongoing stressors.  Appreciate psychiatric consultation Please contact [phone] or [phone]  if needs further assistance   Disposition: Referred to out patient psychiatric treatment with Dr. Ladoris Gene at [place] [facility] when medically stable.  Alvin Allen,JANARDHAHA R. 05/18/2015 4:16 PM

## 2015-05-18 NOTE — Progress Notes (Signed)
PULMONARY / CRITICAL CARE MEDICINE   Name: Alvin Allen MRN: [id number] DOB: [birth year]    ADMISSION DATE:  05/17/2015 CONSULTATION DATE:  05/18/2015  REFERRING MD :  Trauma  CHIEF COMPLAINT:  Found down  INITIAL PRESENTATION:  54 y.o. M brought to [facility] ED 9/8 after being found down by daughter.  In ED, he had moderately elevated CK (4650) therefore PCCM was called due to concerns of rhabdo.   STUDIES:  CT head / maxillofacial / C-Spine 9/8 >>> large soft tissue hematoma over left frontal bone.  Nondisplaced fracture inferior leftfrontal bone extending into the superior anterior orbital rim. Nondisplaced fx's of anterior superior left orbital rim as well as posterior left maxillary sinus wall.  Extensive paranasal sinus disease.  Soft tissue swelling noted over both upper facial and preseptal orbital regions.  No acute C spine fx's. Pelvis XR 9/8 >>> no acute process. CXR 9/8 >>> no acute process.  SIGNIFICANT EVENTS: 9/8 - admit.    SUBJECTIVE:  Family reports ongoing confusion, clumsiness, insomnia at home for several months.  Has not worked in over a year. Recent diagnosis of seizure.  Recently quit alcohol.  They are not sure if he is still using marijuana.  VITAL SIGNS: Temp:  [97.5 F (36.4 C)-98.9 F (37.2 C)] 98.9 F (37.2 C) (09/09 0750) Pulse Rate:  [67-134] 103 (09/09 0800) Resp:  [10-24] 15 (09/09 0800) BP: (0-146)/(0-110) 128/97 mmHg (09/09 0800) SpO2:  [94 %-98 %] 96 % (09/09 0800) Weight:  [183 lb 6.8 oz (83.2 kg)-236 lb (107.049 kg)] 183 lb 6.8 oz (83.2 kg) (09/08 2234) HEMODYNAMICS:   VENTILATOR SETTINGS:   INTAKE / OUTPUT: Intake/Output      09/08 0701 - 09/09 0700 09/09 0701 - 09/10 0700   I.V. (mL/kg) 10875 (130.7) 244.6 (2.9)   IV Piggyback 55    Total Intake(mL/kg) 10930 (131.4) 244.6 (2.9)   Urine (mL/kg/hr) 4300 350 (1.1)   Total Output 4300 350   Net +6630 -105.4          PHYSICAL EXAMINATION: General: awake in bed, confused HENT:  periorbital bruising, OP clear PULM: CTA B CV: RRR, no mgr GI: BS+, soft, nontender MSK: facial bony fractures noted, R wrist tender Neuro: Awake, confused, maew  LABS:  CBC  Recent Labs Lab 05/17/15 1812 05/17/15 1844 05/18/15 0625  WBC 12.2*  --  8.1  HGB 13.1 13.9 11.0*  HCT 39.0 41.0 32.7*  PLT 277  --  258   Coag's  Recent Labs Lab 05/17/15 1812  INR 1.05   BMET  Recent Labs Lab 05/17/15 1812 05/17/15 1844 05/18/15 0625  NA 139 137 143  K 3.8 5.7* 3.5  CL 106 103 111  CO2 26  --  26  BUN 15 25* 9  CREATININE 0.86 0.90 0.71  GLUCOSE 140* 128* 93   Electrolytes  Recent Labs Lab 05/17/15 1812 05/18/15 0625 05/18/15 0953  CALCIUM 8.7* 8.3*  --   MG  --   --  1.6*   Sepsis Markers  Recent Labs Lab 05/17/15 1821  LATICACIDVEN 1.7   ABG No results for input(s): PHART, PCO2ART, PO2ART in the last 168 hours. Liver Enzymes  Recent Labs Lab 05/17/15 1812 05/18/15 0953  AST 108* 98*  ALT 66* 53  ALKPHOS 82 68  BILITOT 0.9 0.5  ALBUMIN 3.5 2.9*   Cardiac Enzymes  Recent Labs Lab 05/17/15 2129 05/18/15 0005 05/18/15 0625  TROPONINI 1.82* 2.51* 2.57*   Glucose  Recent Labs Lab 05/17/15 1758  GLUCAP 131*    Imaging Ct Head Wo Contrast  05/17/2015   CLINICAL DATA:  Patient fell down stairs  EXAM: CT HEAD WITHOUT CONTRAST  CT MAXILLOFACIAL WITHOUT CONTRAST  CT CERVICAL SPINE WITHOUT CONTRAST  TECHNIQUE: Multidetector CT imaging of the head, cervical spine, and maxillofacial structures were performed using the standard protocol without intravenous contrast. Multiplanar CT image reconstructions of the cervical spine and maxillofacial structures were also generated.  COMPARISON:  None.  FINDINGS: CT HEAD FINDINGS  The ventricles are normal in size and configuration. There is no intracranial mass hemorrhage, extra-axial fluid collection, or midline shift. The gray-white compartments appear normal. No acute infarct is evident. There is a large  hematoma overlying the left frontal bone. There is a nondisplaced fracture of the anterior superior left orbital rim extending to the inferior left frontal bone with air along the superior most aspect of the orbit. This air is confined to the intraorbital region. The bony calvarium otherwise appears intact. The mastoid air cells are clear.  CT MAXILLOFACIAL FINDINGS  There is marked soft tissue swelling over the left frontal bone with acute hematoma. There is a fracture of the anterior superior left orbital rim extending into the inferior left frontal bone focal air in the superior posterior aspect of the left orbit. There is a fracture of the medial posterior left maxillary wall. No other fractures are apparent. There is no dislocation. There is no intraorbital lesion beyond the air in the superior posterior left orbit. The globes appear intact as do the extraocular muscles and optic nerve regions.  There is soft tissue swelling over the upper face and preseptal orbital regions bilaterally.  There is extensive opacification of the left frontal sinus with air-fluid level. There is opacification of multiple ethmoid air cells bilaterally, more on the left than on the right. There is diffuse opacification of much of the left maxillary antrum with an air-fluid level. There is hemorrhage in the upper nares regions bilaterally. There is ostiomeatal unit obstruction bilaterally, presumably due to hemorrhage. There is moderate mucosal thickening in the right maxillary antrum. There is a small air-fluid level in the right sphenoid sinus.  The visualized pharyngeal air column is normal. Salivary glands appear normal. No adenopathy.  CT CERVICAL SPINE FINDINGS  There is no fracture or spondylolisthesis. Prevertebral soft tissues and predental space regions are normal. There is moderately severe disc space narrowing at C6-7 and C7-T1. There is milder narrowing at C4-5 and C5-6. There is mild narrowing at C3-4. There is no nerve  root edema or effacement. There is calcification in the posterior longitudinal ligament at C4 and C5. No demonstrable disc extrusion or high-grade stenosis.  There is alveolar opacity in each lung apex, consistent with either edema or possibly contusion.  IMPRESSION: Large soft tissue hematoma over the left frontal bone. Nondisplaced fracture inferior left frontal bone extending to the superior anterior orbital rim. No intracranial mass, hemorrhage, or extra-axial fluid collection. Gray-white compartments appear normal.  CT maxillofacial: Nondisplaced fractures of the anterior superior left orbital rim as well as the posterior left maxillary sinus wall. No other fractures are apparent. No dislocation. Extensive paranasal sinus disease. Obstruction of both ostiomeatal unit complexes, presumably due to blood. Soft tissue swelling noted over both upper facial and preseptal orbital regions. Swelling greater on the left than on the right. More superiorly, there is a hematoma over the left frontal bone.  CT cervical spine: Areas of osteoarthritic change. Calcification in the posterior longitudinal ligament at C4 and  C5. No acute fracture or spondylolisthesis. Question edema versus contusion in the lung apices bilaterally.   Electronically Signed   By: Bretta Bang III M.D.   On: 05/17/2015 19:58   Ct Cervical Spine Wo Contrast  05/17/2015   CLINICAL DATA:  Patient fell down stairs  EXAM: CT HEAD WITHOUT CONTRAST  CT MAXILLOFACIAL WITHOUT CONTRAST  CT CERVICAL SPINE WITHOUT CONTRAST  TECHNIQUE: Multidetector CT imaging of the head, cervical spine, and maxillofacial structures were performed using the standard protocol without intravenous contrast. Multiplanar CT image reconstructions of the cervical spine and maxillofacial structures were also generated.  COMPARISON:  None.  FINDINGS: CT HEAD FINDINGS  The ventricles are normal in size and configuration. There is no intracranial mass hemorrhage, extra-axial fluid  collection, or midline shift. The gray-white compartments appear normal. No acute infarct is evident. There is a large hematoma overlying the left frontal bone. There is a nondisplaced fracture of the anterior superior left orbital rim extending to the inferior left frontal bone with air along the superior most aspect of the orbit. This air is confined to the intraorbital region. The bony calvarium otherwise appears intact. The mastoid air cells are clear.  CT MAXILLOFACIAL FINDINGS  There is marked soft tissue swelling over the left frontal bone with acute hematoma. There is a fracture of the anterior superior left orbital rim extending into the inferior left frontal bone focal air in the superior posterior aspect of the left orbit. There is a fracture of the medial posterior left maxillary wall. No other fractures are apparent. There is no dislocation. There is no intraorbital lesion beyond the air in the superior posterior left orbit. The globes appear intact as do the extraocular muscles and optic nerve regions.  There is soft tissue swelling over the upper face and preseptal orbital regions bilaterally.  There is extensive opacification of the left frontal sinus with air-fluid level. There is opacification of multiple ethmoid air cells bilaterally, more on the left than on the right. There is diffuse opacification of much of the left maxillary antrum with an air-fluid level. There is hemorrhage in the upper nares regions bilaterally. There is ostiomeatal unit obstruction bilaterally, presumably due to hemorrhage. There is moderate mucosal thickening in the right maxillary antrum. There is a small air-fluid level in the right sphenoid sinus.  The visualized pharyngeal air column is normal. Salivary glands appear normal. No adenopathy.  CT CERVICAL SPINE FINDINGS  There is no fracture or spondylolisthesis. Prevertebral soft tissues and predental space regions are normal. There is moderately severe disc space  narrowing at C6-7 and C7-T1. There is milder narrowing at C4-5 and C5-6. There is mild narrowing at C3-4. There is no nerve root edema or effacement. There is calcification in the posterior longitudinal ligament at C4 and C5. No demonstrable disc extrusion or high-grade stenosis.  There is alveolar opacity in each lung apex, consistent with either edema or possibly contusion.  IMPRESSION: Large soft tissue hematoma over the left frontal bone. Nondisplaced fracture inferior left frontal bone extending to the superior anterior orbital rim. No intracranial mass, hemorrhage, or extra-axial fluid collection. Gray-white compartments appear normal.  CT maxillofacial: Nondisplaced fractures of the anterior superior left orbital rim as well as the posterior left maxillary sinus wall. No other fractures are apparent. No dislocation. Extensive paranasal sinus disease. Obstruction of both ostiomeatal unit complexes, presumably due to blood. Soft tissue swelling noted over both upper facial and preseptal orbital regions. Swelling greater on the left than on the  right. More superiorly, there is a hematoma over the left frontal bone.  CT cervical spine: Areas of osteoarthritic change. Calcification in the posterior longitudinal ligament at C4 and C5. No acute fracture or spondylolisthesis. Question edema versus contusion in the lung apices bilaterally.   Electronically Signed   By: Bretta Bang III M.D.   On: 05/17/2015 19:58   Dg Pelvis Portable  05/17/2015   CLINICAL DATA:  Trauma.  Patient fell today.  EXAM: PORTABLE PELVIS 1-2 VIEWS  COMPARISON:  None.  FINDINGS: The right iliac crest is excluded. No evidence for acute fracture or subluxation. Regional bowel gas pattern is nonobstructed. Surgical clips are noted in the right upper quadrant.  IMPRESSION: 1.  No evidence for acute  abnormality. 2. Right iliac crest excluded.   Electronically Signed   By: Norva Pavlov M.D.   On: 05/17/2015 18:43   Dg Chest Portable 1  View  05/17/2015   CLINICAL DATA:  Pain following fall  EXAM: PORTABLE CHEST - 1 VIEW  COMPARISON:  None.  FINDINGS: There is no edema or consolidation. Heart size and pulmonary vascularity are normal. No adenopathy. No bone lesions.  IMPRESSION: No edema or consolidation.  No pneumothorax.   Electronically Signed   By: Bretta Bang III M.D.   On: 05/17/2015 18:42   Ct Maxillofacial Wo Cm  05/17/2015   CLINICAL DATA:  Patient fell down stairs  EXAM: CT HEAD WITHOUT CONTRAST  CT MAXILLOFACIAL WITHOUT CONTRAST  CT CERVICAL SPINE WITHOUT CONTRAST  TECHNIQUE: Multidetector CT imaging of the head, cervical spine, and maxillofacial structures were performed using the standard protocol without intravenous contrast. Multiplanar CT image reconstructions of the cervical spine and maxillofacial structures were also generated.  COMPARISON:  None.  FINDINGS: CT HEAD FINDINGS  The ventricles are normal in size and configuration. There is no intracranial mass hemorrhage, extra-axial fluid collection, or midline shift. The gray-white compartments appear normal. No acute infarct is evident. There is a large hematoma overlying the left frontal bone. There is a nondisplaced fracture of the anterior superior left orbital rim extending to the inferior left frontal bone with air along the superior most aspect of the orbit. This air is confined to the intraorbital region. The bony calvarium otherwise appears intact. The mastoid air cells are clear.  CT MAXILLOFACIAL FINDINGS  There is marked soft tissue swelling over the left frontal bone with acute hematoma. There is a fracture of the anterior superior left orbital rim extending into the inferior left frontal bone focal air in the superior posterior aspect of the left orbit. There is a fracture of the medial posterior left maxillary wall. No other fractures are apparent. There is no dislocation. There is no intraorbital lesion beyond the air in the superior posterior left orbit.  The globes appear intact as do the extraocular muscles and optic nerve regions.  There is soft tissue swelling over the upper face and preseptal orbital regions bilaterally.  There is extensive opacification of the left frontal sinus with air-fluid level. There is opacification of multiple ethmoid air cells bilaterally, more on the left than on the right. There is diffuse opacification of much of the left maxillary antrum with an air-fluid level. There is hemorrhage in the upper nares regions bilaterally. There is ostiomeatal unit obstruction bilaterally, presumably due to hemorrhage. There is moderate mucosal thickening in the right maxillary antrum. There is a small air-fluid level in the right sphenoid sinus.  The visualized pharyngeal air column is normal. Salivary glands appear  normal. No adenopathy.  CT CERVICAL SPINE FINDINGS  There is no fracture or spondylolisthesis. Prevertebral soft tissues and predental space regions are normal. There is moderately severe disc space narrowing at C6-7 and C7-T1. There is milder narrowing at C4-5 and C5-6. There is mild narrowing at C3-4. There is no nerve root edema or effacement. There is calcification in the posterior longitudinal ligament at C4 and C5. No demonstrable disc extrusion or high-grade stenosis.  There is alveolar opacity in each lung apex, consistent with either edema or possibly contusion.  IMPRESSION: Large soft tissue hematoma over the left frontal bone. Nondisplaced fracture inferior left frontal bone extending to the superior anterior orbital rim. No intracranial mass, hemorrhage, or extra-axial fluid collection. Gray-white compartments appear normal.  CT maxillofacial: Nondisplaced fractures of the anterior superior left orbital rim as well as the posterior left maxillary sinus wall. No other fractures are apparent. No dislocation. Extensive paranasal sinus disease. Obstruction of both ostiomeatal unit complexes, presumably due to blood. Soft tissue  swelling noted over both upper facial and preseptal orbital regions. Swelling greater on the left than on the right. More superiorly, there is a hematoma over the left frontal bone.  CT cervical spine: Areas of osteoarthritic change. Calcification in the posterior longitudinal ligament at C4 and C5. No acute fracture or spondylolisthesis. Question edema versus contusion in the lung apices bilaterally.   Electronically Signed   By: Bretta Bang III M.D.   On: 05/17/2015 19:58    ASSESSMENT / PLAN:  NEUROLOGIC A:   Multiple maxillofacial fractures s/p presumed fall 9/8  Consider syncopal event > related to NSTEMI? Cardiac arrhythmia? Seizure? Polysubstance abuse, insomnia and psyche issues make diagnosis more complicated ? Hx seizure disorder - not on problem list but has depakote on med list Hx bipolar disorder, insomnia Substance abuse - UDS positive for THC P:   Management per trauma / maxillofacial surgery Agree with neurology consult Continue outpatient Depakote, change to IV form. Hold sedatives Hold outpatient clonazepam, eszopiclone, gabapentin unless restarted by neurology Substance abuse counseling  RENAL A:   Mild rhabdomyolysis - > improving, no renal failure P:   Continue IVF today Monitor BMET and UOP Replace electrolytes as needed   PULMONARY A: OSA - no reported CPAP use Nicotine dependence / tobacco use disorder P:   Pulmonary hygiene Nicotine patch Tobacco cessation counseling  CARDIOVASCULAR A:  Troponin leak - suspect demand ischemia from prolonged downtime Hx atypical chest pain. P:  Per cardiology  GASTROINTESTINAL A:   Nutrition P:   Advance diet as able  HEMATOLOGIC A:   VTE Prophylaxis P:  SCD's + lovenox  INFECTIOUS A:   Mild leukocytosis - resolved P:   Monitor clinically  ENDOCRINE A:   Mild hyperglycemia P:   SSI if glucose consistently > 180   Family updated: Wife at bedside 9/9  Interdisciplinary Family Meeting v  Palliative Care Meeting:  Due by: 9/14.  Heber [place], MD [place] [facility] Pager: [phone] Cell: [phone] After 3pm or if no response, call [phone]

## 2015-05-18 NOTE — Progress Notes (Signed)
S:asked to see pt with R distal radius fx from fall  O:Blood pressure 122/87, pulse 88, temperature 98.7 F (37.1 C), temperature source Oral, resp. rate 18, height  (1.778 m), weight 83.2 kg (183 lb 6.8 oz), SpO2 100 %.    A: R minimally displaced distal radius fracture - splinted   P:  Full consult to follow, as of now treat non surgically with splint, elevation, NWB, move fingers.

## 2015-05-18 NOTE — Progress Notes (Signed)
Patient: Alvin Allen / Admit Date: 05/17/2015 / Date of Encounter: 05/18/2015, 9:31 AM   Subjective: Still somewhat out of it this morning per family. Doesn't remember coming to the hospital or anything about his accident (was found down at the bottom of the stairs - unclear if he fell in kitchen and went down stairs, or fell down stairs). Wife reports multiple admissions to [facility] since June with confusion, gait instability, then seizure. Had been taking diet/stimulant meds earlier this summer. He has not complained of any recent CP or SOB per family. This AM he denies chest pain but says he feels achy all over.  EKG this AM: NSR, QT prolongation improved (QTc ), still with lateral TW changes I, avL, V5-V6.  Objective: Telemetry: NSR/sinus tach. Overnight around 4am he did have an episode of accelerated HR that appeared to be sinus tach that got a little faster with slight irregularity but still able to discern P waves - very transient. HR 80s-90s this AM.  EKG this AM: NSR, QT prolongation improved (QTc ), still with lateral TW changes I, avL, V5-V6.  Physical Exam: Blood pressure 128/97, pulse 103, temperature 98.9 F (37.2 C), temperature source Oral, resp. rate 15, height 5\' 10"  (1.778 m), weight 183 lb 6.8 oz (83.2 kg), SpO2 96 %. General: Well developed WM in no acute distress. Head: Normocephalic, atraumatic, sclera non-icteric, no xanthomas, nares are without discharge. Neck: JVP not elevated. Lungs: Clear bilaterally to auscultation without wheezes, rales, or rhonchi. Breathing is unlabored. Heart: RRR S1 S2 without murmurs, rubs, or gallops.  Abdomen: Soft, non-tender, non-distended with normoactive bowel sounds. No rebound/guarding. Extremities: No clubbing or cyanosis. No edema. Distal pedal pulses are 2+ and equal bilaterally. Neuro: Sleepy, answers simple questions but falls back asleep easily.   Intake/Output Summary (Last 24 hours) at 05/18/15 0931 Last  data filed at 05/18/15 0900  Gross per 24 hour  Intake 11174.58 ml  Output   4650 ml  Net 6524.58 ml    Inpatient Medications:  . aspirin  324 mg Oral Once  . buPROPion  150 mg Oral Daily  . divalproex  1,000 mg Oral Daily  . enoxaparin (LOVENOX) injection  40 mg Subcutaneous Q24H  . Influenza vac split quadrivalent PF  0.5 mL Intramuscular Once  . lamoTRIgine  200 mg Oral Daily  . Levomilnacipran HCl ER  20 mg Oral Daily  . nicotine  7 mg Transdermal Daily  . pantoprazole  40 mg Oral Daily   Or  . pantoprazole (PROTONIX) IV  40 mg Intravenous Daily   Infusions:  . sodium chloride 100 mL/hr at 05/18/15 0900    Labs:  Recent Labs  05/17/15 1812 05/17/15 1844 05/18/15 0625  NA 139 137 143  K 3.8 5.7* 3.5  CL 106 103 111  CO2 26  --  26  GLUCOSE 140* 128* 93  BUN 15 25* 9  CREATININE 0.86 0.90 0.71  CALCIUM 8.7*  --  8.3*    Recent Labs  05/17/15 1812  AST 108*  ALT 66*  ALKPHOS 82  BILITOT 0.9  PROT 6.4*  ALBUMIN 3.5    Recent Labs  05/17/15 1812 05/17/15 1844 05/18/15 0625  WBC 12.2*  --  8.1  NEUTROABS 10.6*  --   --   HGB 13.1 13.9 11.0*  HCT 39.0 41.0 32.7*  MCV 97.0  --  95.9  PLT 277  --  258    Recent Labs  05/17/15 1812 05/17/15 2129 05/18/15 0005 05/18/15 1610  CKTOTAL 4650*  --   --  2924*  CKMB 85.2*  --   --   --   TROPONINI 1.52* 1.82* 2.51* 2.57*   Invalid input(s): POCBNP No results for input(s): HGBA1C in the last 72 hours.   Radiology/Studies:  Ct Head Wo Contrast  05/17/2015   CLINICAL DATA:  Patient fell down stairs  EXAM: CT HEAD WITHOUT CONTRAST  CT MAXILLOFACIAL WITHOUT CONTRAST  CT CERVICAL SPINE WITHOUT CONTRAST  TECHNIQUE: Multidetector CT imaging of the head, cervical spine, and maxillofacial structures were performed using the standard protocol without intravenous contrast. Multiplanar CT image reconstructions of the cervical spine and maxillofacial structures were also generated.  COMPARISON:  None.  FINDINGS: CT  HEAD FINDINGS  The ventricles are normal in size and configuration. There is no intracranial mass hemorrhage, extra-axial fluid collection, or midline shift. The gray-white compartments appear normal. No acute infarct is evident. There is a large hematoma overlying the left frontal bone. There is a nondisplaced fracture of the anterior superior left orbital rim extending to the inferior left frontal bone with air along the superior most aspect of the orbit. This air is confined to the intraorbital region. The bony calvarium otherwise appears intact. The mastoid air cells are clear.  CT MAXILLOFACIAL FINDINGS  There is marked soft tissue swelling over the left frontal bone with acute hematoma. There is a fracture of the anterior superior left orbital rim extending into the inferior left frontal bone focal air in the superior posterior aspect of the left orbit. There is a fracture of the medial posterior left maxillary wall. No other fractures are apparent. There is no dislocation. There is no intraorbital lesion beyond the air in the superior posterior left orbit. The globes appear intact as do the extraocular muscles and optic nerve regions.  There is soft tissue swelling over the upper face and preseptal orbital regions bilaterally.  There is extensive opacification of the left frontal sinus with air-fluid level. There is opacification of multiple ethmoid air cells bilaterally, more on the left than on the right. There is diffuse opacification of much of the left maxillary antrum with an air-fluid level. There is hemorrhage in the upper nares regions bilaterally. There is ostiomeatal unit obstruction bilaterally, presumably due to hemorrhage. There is moderate mucosal thickening in the right maxillary antrum. There is a small air-fluid level in the right sphenoid sinus.  The visualized pharyngeal air column is normal. Salivary glands appear normal. No adenopathy.  CT CERVICAL SPINE FINDINGS  There is no fracture or  spondylolisthesis. Prevertebral soft tissues and predental space regions are normal. There is moderately severe disc space narrowing at C6-7 and C7-T1. There is milder narrowing at C4-5 and C5-6. There is mild narrowing at C3-4. There is no nerve root edema or effacement. There is calcification in the posterior longitudinal ligament at C4 and C5. No demonstrable disc extrusion or high-grade stenosis.  There is alveolar opacity in each lung apex, consistent with either edema or possibly contusion.  IMPRESSION: Large soft tissue hematoma over the left frontal bone. Nondisplaced fracture inferior left frontal bone extending to the superior anterior orbital rim. No intracranial mass, hemorrhage, or extra-axial fluid collection. Gray-white compartments appear normal.  CT maxillofacial: Nondisplaced fractures of the anterior superior left orbital rim as well as the posterior left maxillary sinus wall. No other fractures are apparent. No dislocation. Extensive paranasal sinus disease. Obstruction of both ostiomeatal unit complexes, presumably due to blood. Soft tissue swelling noted over both upper facial and  preseptal orbital regions. Swelling greater on the left than on the right. More superiorly, there is a hematoma over the left frontal bone.  CT cervical spine: Areas of osteoarthritic change. Calcification in the posterior longitudinal ligament at C4 and C5. No acute fracture or spondylolisthesis. Question edema versus contusion in the lung apices bilaterally.   Electronically Signed   By: Bretta Bang III M.D.   On: 05/17/2015 19:58   Ct Cervical Spine Wo Contrast  05/17/2015   CLINICAL DATA:  Patient fell down stairs  EXAM: CT HEAD WITHOUT CONTRAST  CT MAXILLOFACIAL WITHOUT CONTRAST  CT CERVICAL SPINE WITHOUT CONTRAST  TECHNIQUE: Multidetector CT imaging of the head, cervical spine, and maxillofacial structures were performed using the standard protocol without intravenous contrast. Multiplanar CT image  reconstructions of the cervical spine and maxillofacial structures were also generated.  COMPARISON:  None.  FINDINGS: CT HEAD FINDINGS  The ventricles are normal in size and configuration. There is no intracranial mass hemorrhage, extra-axial fluid collection, or midline shift. The gray-white compartments appear normal. No acute infarct is evident. There is a large hematoma overlying the left frontal bone. There is a nondisplaced fracture of the anterior superior left orbital rim extending to the inferior left frontal bone with air along the superior most aspect of the orbit. This air is confined to the intraorbital region. The bony calvarium otherwise appears intact. The mastoid air cells are clear.  CT MAXILLOFACIAL FINDINGS  There is marked soft tissue swelling over the left frontal bone with acute hematoma. There is a fracture of the anterior superior left orbital rim extending into the inferior left frontal bone focal air in the superior posterior aspect of the left orbit. There is a fracture of the medial posterior left maxillary wall. No other fractures are apparent. There is no dislocation. There is no intraorbital lesion beyond the air in the superior posterior left orbit. The globes appear intact as do the extraocular muscles and optic nerve regions.  There is soft tissue swelling over the upper face and preseptal orbital regions bilaterally.  There is extensive opacification of the left frontal sinus with air-fluid level. There is opacification of multiple ethmoid air cells bilaterally, more on the left than on the right. There is diffuse opacification of much of the left maxillary antrum with an air-fluid level. There is hemorrhage in the upper nares regions bilaterally. There is ostiomeatal unit obstruction bilaterally, presumably due to hemorrhage. There is moderate mucosal thickening in the right maxillary antrum. There is a small air-fluid level in the right sphenoid sinus.  The visualized  pharyngeal air column is normal. Salivary glands appear normal. No adenopathy.  CT CERVICAL SPINE FINDINGS  There is no fracture or spondylolisthesis. Prevertebral soft tissues and predental space regions are normal. There is moderately severe disc space narrowing at C6-7 and C7-T1. There is milder narrowing at C4-5 and C5-6. There is mild narrowing at C3-4. There is no nerve root edema or effacement. There is calcification in the posterior longitudinal ligament at C4 and C5. No demonstrable disc extrusion or high-grade stenosis.  There is alveolar opacity in each lung apex, consistent with either edema or possibly contusion.  IMPRESSION: Large soft tissue hematoma over the left frontal bone. Nondisplaced fracture inferior left frontal bone extending to the superior anterior orbital rim. No intracranial mass, hemorrhage, or extra-axial fluid collection. Gray-white compartments appear normal.  CT maxillofacial: Nondisplaced fractures of the anterior superior left orbital rim as well as the posterior left maxillary sinus wall. No  other fractures are apparent. No dislocation. Extensive paranasal sinus disease. Obstruction of both ostiomeatal unit complexes, presumably due to blood. Soft tissue swelling noted over both upper facial and preseptal orbital regions. Swelling greater on the left than on the right. More superiorly, there is a hematoma over the left frontal bone.  CT cervical spine: Areas of osteoarthritic change. Calcification in the posterior longitudinal ligament at C4 and C5. No acute fracture or spondylolisthesis. Question edema versus contusion in the lung apices bilaterally.   Electronically Signed   By: Bretta Bang III M.D.   On: 05/17/2015 19:58   Dg Pelvis Portable  05/17/2015   CLINICAL DATA:  Trauma.  Patient fell today.  EXAM: PORTABLE PELVIS 1-2 VIEWS  COMPARISON:  None.  FINDINGS: The right iliac crest is excluded. No evidence for acute fracture or subluxation. Regional bowel gas pattern  is nonobstructed. Surgical clips are noted in the right upper quadrant.  IMPRESSION: 1.  No evidence for acute  abnormality. 2. Right iliac crest excluded.   Electronically Signed   By: Norva Pavlov M.D.   On: 05/17/2015 18:43   Dg Chest Portable 1 View  05/17/2015   CLINICAL DATA:  Pain following fall  EXAM: PORTABLE CHEST - 1 VIEW  COMPARISON:  None.  FINDINGS: There is no edema or consolidation. Heart size and pulmonary vascularity are normal. No adenopathy. No bone lesions.  IMPRESSION: No edema or consolidation.  No pneumothorax.   Electronically Signed   By: Bretta Bang III M.D.   On: 05/17/2015 18:42   Ct Maxillofacial Wo Cm  05/17/2015   CLINICAL DATA:  Patient fell down stairs  EXAM: CT HEAD WITHOUT CONTRAST  CT MAXILLOFACIAL WITHOUT CONTRAST  CT CERVICAL SPINE WITHOUT CONTRAST  TECHNIQUE: Multidetector CT imaging of the head, cervical spine, and maxillofacial structures were performed using the standard protocol without intravenous contrast. Multiplanar CT image reconstructions of the cervical spine and maxillofacial structures were also generated.  COMPARISON:  None.  FINDINGS: CT HEAD FINDINGS  The ventricles are normal in size and configuration. There is no intracranial mass hemorrhage, extra-axial fluid collection, or midline shift. The gray-white compartments appear normal. No acute infarct is evident. There is a large hematoma overlying the left frontal bone. There is a nondisplaced fracture of the anterior superior left orbital rim extending to the inferior left frontal bone with air along the superior most aspect of the orbit. This air is confined to the intraorbital region. The bony calvarium otherwise appears intact. The mastoid air cells are clear.  CT MAXILLOFACIAL FINDINGS  There is marked soft tissue swelling over the left frontal bone with acute hematoma. There is a fracture of the anterior superior left orbital rim extending into the inferior left frontal bone focal air in the  superior posterior aspect of the left orbit. There is a fracture of the medial posterior left maxillary wall. No other fractures are apparent. There is no dislocation. There is no intraorbital lesion beyond the air in the superior posterior left orbit. The globes appear intact as do the extraocular muscles and optic nerve regions.  There is soft tissue swelling over the upper face and preseptal orbital regions bilaterally.  There is extensive opacification of the left frontal sinus with air-fluid level. There is opacification of multiple ethmoid air cells bilaterally, more on the left than on the right. There is diffuse opacification of much of the left maxillary antrum with an air-fluid level. There is hemorrhage in the upper nares regions bilaterally. There is  ostiomeatal unit obstruction bilaterally, presumably due to hemorrhage. There is moderate mucosal thickening in the right maxillary antrum. There is a small air-fluid level in the right sphenoid sinus.  The visualized pharyngeal air column is normal. Salivary glands appear normal. No adenopathy.  CT CERVICAL SPINE FINDINGS  There is no fracture or spondylolisthesis. Prevertebral soft tissues and predental space regions are normal. There is moderately severe disc space narrowing at C6-7 and C7-T1. There is milder narrowing at C4-5 and C5-6. There is mild narrowing at C3-4. There is no nerve root edema or effacement. There is calcification in the posterior longitudinal ligament at C4 and C5. No demonstrable disc extrusion or high-grade stenosis.  There is alveolar opacity in each lung apex, consistent with either edema or possibly contusion.  IMPRESSION: Large soft tissue hematoma over the left frontal bone. Nondisplaced fracture inferior left frontal bone extending to the superior anterior orbital rim. No intracranial mass, hemorrhage, or extra-axial fluid collection. Gray-white compartments appear normal.  CT maxillofacial: Nondisplaced fractures of the  anterior superior left orbital rim as well as the posterior left maxillary sinus wall. No other fractures are apparent. No dislocation. Extensive paranasal sinus disease. Obstruction of both ostiomeatal unit complexes, presumably due to blood. Soft tissue swelling noted over both upper facial and preseptal orbital regions. Swelling greater on the left than on the right. More superiorly, there is a hematoma over the left frontal bone.  CT cervical spine: Areas of osteoarthritic change. Calcification in the posterior longitudinal ligament at C4 and C5. No acute fracture or spondylolisthesis. Question edema versus contusion in the lung apices bilaterally.   Electronically Signed   By: Bretta Bang III M.D.   On: 05/17/2015 19:58     Assessment and Plan  71M with bipolar disorder (on number of psychotropic meds), seizures, tobacco abuse, former alcohol abuse admitted with altered mental status after not sleeping for 4 days. Found to have orbital fracture, rhabdomyolysis, hyperkalemia, prolonged QT>53ms, elevated troponin. UDS + THC.  1. Elevated troponin - possibly due to demand ischemia in the setting of rhabdo. Troponin a little higher than one may expect for demand ischemia but CK was >4000. Not a great candidate for invasive workup at present time with recent trauma. We can consider noninvasive workup down the road. OK to add aspirin per d/w trauma PA. Will discuss further workup and rx with MD - preliminarily per our discussion, will hold off full dose heparin given trauma and lack of chest pain. Will check lipids in AM to risk stratify, but hold off statin until we see trajectory of abnormal LFTs. F/u troponin level in AM as well - recently values appear to have plateaued.  2. LV dysfunction - per fellow note, bedside limited echo showed EF 40-45%. Will order formal echocardiogram to assess.  3. QT prolongation - may be due to psychotropic meds and acute metabolic derangement. Improved this AM. Mg  level is low at 1.6. Will replete IV and f/u level in AM.  Signed, Ronie Spies PA-C Pager: [phone]

## 2015-05-18 NOTE — Progress Notes (Signed)
Patient ID: Alvin Allen, male   DOB: Oct 16, 1960, 54 y.o.   MRN: [id number]   LOS: 1 day   Subjective: Denies significant pain, wants to go home. Encephalopathy seems to be improved based on other notes.   Objective: Vital signs in last 24 hours: Temp:  [97.5 F (36.4 C)-98.9 F (37.2 C)] 98.9 F (37.2 C) (09/09 0750) Pulse Rate:  [67-134] 93 (09/09 0730) Resp:  [10-24] 17 (09/09 0730) BP: (0-146)/(0-110) 127/94 mmHg (09/09 0730) SpO2:  [94 %-98 %] 97 % (09/09 0730) Weight:  [83.2 kg (183 lb 6.8 oz)-107.049 kg (236 lb)] 83.2 kg (183 lb 6.8 oz) (09/08 2234)    Laboratory  CBC  Recent Labs  05/17/15 1812 05/17/15 1844 05/18/15 0625  WBC 12.2*  --  8.1  HGB 13.1 13.9 11.0*  HCT 39.0 41.0 32.7*  PLT 277  --  258   BMET  Recent Labs  05/17/15 1812 05/17/15 1844 05/18/15 0625  NA 139 137 143  K 3.8 5.7* 3.5  CL 106 103 111  CO2 26  --  26  GLUCOSE 140* 128* 93  BUN 15 25* 9  CREATININE 0.86 0.90 0.71  CALCIUM 8.7*  --  8.3*   Cardiac Panel (last 3 results)  Recent Labs  05/17/15 1812 05/17/15 2129 05/18/15 0005 05/18/15 0625  CKTOTAL 4650*  --   --  2924*  CKMB 85.2*  --   --   --   TROPONINI 1.52* 1.82* 2.51* 2.57*  RELINDX 1.8  --   --   --     Physical Exam General appearance: alert, no distress and Ox2 ([place]) Neck: No TTP, no pain with AROM Resp: clear to auscultation bilaterally Cardio: regular rate and rhythm GI: normal findings: bowel sounds normal and soft, non-tender Extremities: Minimal TTP right wrist   Assessment/Plan: Fall Concussion -- Likely cause of confusion Multiple facial fxs -- Awaiting consult ABL anemia -- Mild Rhabdomyolysis -- CK improved from 4650 to 2924 AMI -- Troponin continues to rise, cardiology following Bipolar disorder -- Home meds FEN -- Give diet VTE -- SCD's, start Lovenox Dispo -- TBI team    Freeman Caldron, PA-C Pager: [phone] General Trauma PA Pager: [phone]  05/18/2015

## 2015-05-18 NOTE — Progress Notes (Signed)
Initial Nutrition Assessment  DOCUMENTATION CODES:   Severe malnutrition in context of chronic illness  INTERVENTION:   Consider checking lab values for Vitamin A, E, K, B1 (thiamine), B12, and B9 (folate) due to pt's hx of biliopancreatic diversion  Ensure Enlive po BID, each supplement provides 350 kcal and 20 grams of protein  NUTRITION DIAGNOSIS:   Malnutrition related to chronic illness as evidenced by severe depletion of body fat, severe depletion of muscle mass, energy intake < or equal to 75% for > or equal to 1 month.   GOAL:   Patient will meet greater than or equal to 90% of their needs   MONITOR:   Supplement acceptance, PO intake, Labs, Weight trends, I & O's  REASON FOR ASSESSMENT:   Malnutrition Screening Tool    ASSESSMENT:   Pt with hx of bariatric surgery in 2006 and bipolar/ETOH admitted after being found down at home. Admitted with rhabdo and multiple facial fractures.    Per pt's wife pt has hx of Biliary Pancreatic Diversion with Duodenal Switch in 2006. He had the surgery in [place] due to his size. At time of surgery pt was 500 lb. Pt is now down to 183 lb. Pt's usual weight after surgery was 200 lb. Wife reports pt began having trouble with his Bipolar disorder and has been unable to work for the past year. Apparently his PCP had asked pt to lose more weight which he did but at his last appointment was still losing weight and dr advised to prevent further weight loss. Wife feels weight loss has been from lack of appetite as pt will only eat when he is hungry. He is no longer taking any of his MVI/Ca supplements. Intake: Breakfast: sausage biscuit, lunch: usually none or grazes, Dinner: wife and pt split an entree at [employer] has a hx of ETOH abuse but after a hospitalization 3 weeks ago pt is drinking less. Per wife he drinks a beer after he gets in bed at night. He is also now vaps instead of smoking cigarettes. He has become obsessive with it  and wife feels that he is vaping more and eating less and less.   Partial Nutrition-Focused physical exam completed. Findings are severe fat depletion, severe muscle depletion, and no edema.    Diet Order:  Diet regular Room service appropriate?: Yes; Fluid consistency:: Thin  Skin:  Reviewed, no issues  Last BM:  unknown  Height:   Ht Readings from Last 1 Encounters:  05/17/15  (1.778 m)    Weight:   Wt Readings from Last 1 Encounters:  05/17/15 183 lb 6.8 oz (83.2 kg)    Ideal Body Weight:  75.4 kg  BMI:  Body mass index is 26.32 kg/(m^2).  Estimated Nutritional Needs:   Kcal:  2000-2200  Protein:  100-130 grams  Fluid:  >/= 2 L/day  EDUCATION NEEDS:   No education needs identified at this time  Kendell Bane RD, LDN, CNSC [phone] Pager [phone] After Hours Pager

## 2015-05-18 NOTE — Progress Notes (Signed)
Pt in room pulling on Aspen collar. Instructed pt important to leave Aspen collar in place d/t injuries and possible unknown injuries. Pt verbalizes understanding, continues to pull at collar. Instructed pt to use call light for nurse and not to get out of bed. Explained reason behind bed alarm. Pt acknowledges.  Wife in room, restated conversation with pt to wife. Wife verbalizes understanding.

## 2015-05-18 NOTE — Evaluation (Signed)
Physical Therapy Evaluation Patient Details Name: Alvin Allen MRN: [id number] DOB: 1961-04-23 Today's Date: 05/18/2015   History of Present Illness  54 y.o. male admitted to [facility] on 05/17/15 after he fell and was down for an unknown period of time.  Pt with multiple facial fxs, rhabdomyolysis, Elevated triponin suspected due to rhabdo but cannot r/o MI- cardiology is following.  Psych and neurology are also following.  Pt with significant PMHx of bipolar d/o, insomnia,  and gastric bypass surgery.  Pt also has R wrist fx that is splinted.   Clinical Impression  Pt is a high fall risk both with his balance and cognitive deficits.  He has absolutely no awareness that he is unsteady on his feet and cannot relate that to why he needs his family to be there to help him at all times.  He is very impulsive.  Pt at this time recommending HHPT and 24/7 supervision.   PT to follow acutely for deficits listed below.       Follow Up Recommendations Home health PT;Supervision/Assistance - 24 hour    Equipment Recommendations  None recommended by PT    Recommendations for Other Services   NA    Precautions / Restrictions Precautions Precautions: Fall Precaution Comments: pt very unsteady on his feet with no awareness of his deficits.       Mobility  Bed Mobility Overal bed mobility: Modified Independent             General bed mobility comments: used railing to pull up to sitting  Transfers Overall transfer level: Needs assistance Equipment used: 1 person hand held assist Transfers: Sit to/from Stand Sit to Stand: Min assist         General transfer comment: Pt with significant increase in sway when going to standing from sitting, anterior and then posterior lean with bil lower legs relying on bed for support and balance.   Ambulation/Gait Ambulation/Gait assistance: Mod assist Ambulation Distance (Feet): 25 Feet Assistive device:  (IV pole) Gait Pattern/deviations: Step-through  pattern;Staggering left;Staggering right;Shuffle;Narrow base of support Gait velocity: decreased Gait velocity interpretation: Below normal speed for age/gender General Gait Details: pt with staggering gait pattern and absolutely no awareness that he is unsteady.  His gait stability combined with his awareness present as a significant fall risk.  HR and O2 sats stable on RA during gait.  Limited ambulation distance due to triponins have not yet started trending down.          Balance Overall balance assessment: Needs assistance Sitting-balance support: Feet supported;No upper extremity supported Sitting balance-Leahy Scale: Fair Sitting balance - Comments: pt almost fell out of the recliner chair at the end of the session while reaching for something on the bed.    Standing balance support: Single extremity supported Standing balance-Leahy Scale: Poor                               Pertinent Vitals/Pain Pain Assessment: Faces Faces Pain Scale: Hurts a little bit Pain Location: head  Pain Descriptors / Indicators: Aching Pain Intervention(s): Limited activity within patient's tolerance;Monitored during session;Repositioned    Home Living Family/patient expects to be discharged to:: Private residence Living Arrangements: Spouse/significant other;Children Available Help at Discharge: Family;Available PRN/intermittently Type of Home: House Home Access: Stairs to enter Entrance Stairs-Rails: None Entrance Stairs-Number of Steps: 10 Home Layout: Multi-level;Bed/bath upstairs Home Equipment: Walker - 2 wheels      Prior Function Level of  Independence: Independent         Comments: Pt is independent, however, wife and son report he had been more unsteady lately, reaching for things to stabilize against.      Hand Dominance   Dominant Hand: Right    Extremity/Trunk Assessment   Upper Extremity Assessment: Defer to OT evaluation           Lower Extremity  Assessment: Overall WFL for tasks assessed      Cervical / Trunk Assessment: Normal  Communication   Communication: No difficulties  Cognition Arousal/Alertness: Awake/alert Behavior During Therapy: Impulsive Overall Cognitive Status: Impaired/Different from baseline Area of Impairment: Attention;Memory;Following commands;Safety/judgement;Awareness;Problem solving;Rancho level   Current Attention Level: Sustained Memory: Decreased short-term memory (decreased memory surrounding the fall) Following Commands: Follows one step commands consistently;Follows multi-step commands inconsistently Safety/Judgement: Decreased awareness of safety;Decreased awareness of deficits Awareness: Intellectual (if that) Problem Solving: Difficulty sequencing;Requires verbal cues;Requires tactile cues General Comments: When asked if he is steady on his feet, pt reports "yes".  He has significant staggering during short distance gait today.  He also doesn't know why I would recommend that he has 24 hour supervision.      General Comments General comments (skin integrity, edema, etc.): I spoke at length with pt, wife, and son re: dangers of him having a second head trauma and my recommendation that he have 24 hour supervision at discharge.  I also spoke about the headache and not to overstimulate with computer, TV, visitors, phone if these things were increasing his symptoms/HA.            Assessment/Plan    PT Assessment Patient needs continued PT services  PT Diagnosis Difficulty walking;Abnormality of gait;Generalized weakness;Acute pain;Altered mental status   PT Problem List Decreased strength;Decreased activity tolerance;Decreased balance;Decreased mobility;Decreased coordination;Decreased cognition;Decreased knowledge of use of DME;Decreased safety awareness;Decreased knowledge of precautions;Cardiopulmonary status limiting activity;Pain  PT Treatment Interventions DME instruction;Gait training;Stair  training;Functional mobility training;Therapeutic activities;Therapeutic exercise;Balance training;Neuromuscular re-education;Cognitive remediation;Patient/family education;Modalities   PT Goals (Current goals can be found in the Care Plan section) Acute Rehab PT Goals Patient Stated Goal: to go home  PT Goal Formulation: With patient/family Time For Goal Achievement: 06/01/15 Potential to Achieve Goals: Good    Frequency Min 3X/week   Barriers to discharge Inaccessible home environment;Decreased caregiver support multiple level home with 10 steps to enter.  Unsure of if family can provide the 24/7 supervision he needs to be safe.        End of Session Equipment Utilized During Treatment: Gait belt Activity Tolerance: Patient tolerated treatment well Patient left: in chair;with call bell/phone within reach;with chair alarm set;with family/visitor present Nurse Communication: Mobility status         Time: 4098-1191 PT Time Calculation (min) (ACUTE ONLY): 49 min   Charges:   PT Evaluation $Initial PT Evaluation Tier I: 1 Procedure PT Treatments $Gait Training: 8-22 mins $Self Care/Home Management: 8-22        Venba Zenner B. Keelin Neville, PT, DPT [phone]   05/18/2015, 5:49 PM

## 2015-05-18 NOTE — Progress Notes (Signed)
Orthopedic Tech Progress Note Patient Details:  Alvin Allen 01-30-1961 [id number]  Ortho Devices Type of Ortho Device: Ace wrap, Sugartong splint Ortho Device/Splint Location: rue Ortho Device/Splint Interventions: Application   Carlee Vonderhaar 05/18/2015, 1:03 PM

## 2015-05-18 NOTE — Consult Note (Signed)
Referring Physician: Trauma    Chief Complaint: ? TIA  HPI:                                                                                                                                         Alvin Allen is an 54 y.o. male with past medical history of Bipolar and insomnia.  Since June he has been to the ED multiple times at [facility]. Per family, there has been many times where he has been slurring his speech and appeared off balance. They would ask if he had taken something and he would deny taking anything. When this occurs he is so unstable he needs to hold onto the wall for support.  He is on [place] but family believes he is not taking it. They state he has given up drinking about 3 months ago and at times will only have a beer or two just before bed. Patient was brought to the hospital on this account due to suffering from a fall which lead to facial fracture. Patient has no recollection. Per family he has had one grand mal seizure in the past but is unclear the etiology.  He is on Lamictal but this is for mood stabilization. Patient has obtained a echo showing 40-45% EF. EEG has been obtained this AM. Family is concerned these slurring spells with gait instability may be TIA.  Neurology was consulted.   Date last known well: Unable to determine Time last known well: Unable to determine tPA Given: No: no LSN and trauma.      Past Medical History  Diagnosis Date  . Bipolar disorder   . Insomnia     Past Surgical History  Procedure Laterality Date  . Gastric bypass  2006    No family history on file. Social History:  reports that he has been smoking Cigarettes.  He does not have any smokeless tobacco history on file. He reports that he uses illicit drugs (Marijuana) about once per week. His alcohol history is not on file.  Allergies: No Known Allergies  Medications:                                                                                                                            Prior to Admission:  Prescriptions prior to admission  Medication Sig Dispense Refill Last Dose  . buPROPion (  ZYBAN) 150 MG 12 hr tablet Take 150 mg by mouth daily.   05/17/2015 at Unknown time  . clonazePAM (KLONOPIN) 0.5 MG tablet Take 0.5 mg by mouth 2 (two) times daily as needed for anxiety.     . divalproex (DEPAKOTE SPRINKLE) 125 MG capsule Take 1,000 mg by mouth daily.     Marland Kitchen lamoTRIgine (LAMICTAL) 100 MG tablet Take 200 mg by mouth daily.     . Levomilnacipran HCl ER (FETZIMA) 20 MG CP24 Take 20 mg by mouth.   05/17/2015 at Unknown time  . triazolam (HALCION) 0.25 MG tablet Take 0.25 mg by mouth at bedtime as needed for sleep.   05/16/2015   Scheduled: . aspirin  81 mg Oral Daily  . buPROPion  150 mg Oral Daily  . divalproex  1,000 mg Oral Daily  . enoxaparin (LOVENOX) injection  40 mg Subcutaneous Q24H  . Influenza vac split quadrivalent PF  0.5 mL Intramuscular Once  . lamoTRIgine  200 mg Oral Daily  . Levomilnacipran HCl ER  20 mg Oral Daily  . magnesium sulfate 1 - 4 g bolus IVPB  2 g Intravenous Once  . nicotine  7 mg Transdermal Daily  . pantoprazole  40 mg Oral Daily   Or  . pantoprazole (PROTONIX) IV  40 mg Intravenous Daily    ROS:                                                                                                                                       History obtained from wife  General ROS: negative for - chills, fatigue, fever, night sweats, weight gain or weight loss Psychological ROS: negative for - behavioral disorder, hallucinations, memory difficulties, mood swings or suicidal ideation Ophthalmic ROS: negative for - blurry vision, double vision, eye pain or loss of vision ENT ROS: negative for - epistaxis, nasal discharge, oral lesions, sore throat, tinnitus or vertigo Allergy and Immunology ROS: negative for - hives or itchy/watery eyes Hematological and Lymphatic ROS: negative for - bleeding problems, bruising or swollen lymph  nodes Endocrine ROS: negative for - galactorrhea, hair pattern changes, polydipsia/polyuria or temperature intolerance Respiratory ROS: negative for - cough, hemoptysis, shortness of breath or wheezing Cardiovascular ROS: negative for - chest pain, dyspnea on exertion, edema or irregular heartbeat Gastrointestinal ROS: negative for - abdominal pain, diarrhea, hematemesis, nausea/vomiting or stool incontinence Genito-Urinary ROS: negative for - dysuria, hematuria, incontinence or urinary frequency/urgency Musculoskeletal ROS: negative for - joint swelling or muscular weakness Neurological ROS: as noted in HPI Dermatological ROS: negative for rash and skin lesion changes  Neurologic Examination:  Blood pressure 128/97, pulse 103, temperature 98.9 F (37.2 C), temperature source Oral, resp. rate 15, height 5\' 10"  (1.778 m), weight 83.2 kg (183 lb 6.8 oz), SpO2 96 %.  HEENT-  Normocephalic, no lesions, without obvious abnormality.  Normal external eye and conjunctiva.  Normal TM's bilaterally.  Normal auditory canals and external ears. Normal external nose, mucus membranes and septum.  Normal pharynx. Cardiovascular- S1, S2 normal, pulses palpable throughout   Lungs- chest clear, no wheezing, rales, normal symmetric air entry Abdomen- normal findings: bowel sounds normal Extremities- no joint deformities, effusion, or inflammation Lymph-no adenopathy palpable Musculoskeletal-no joint tenderness, deformity or swelling Skin-warm and dry, no hyperpigmentation, vitiligo, or suspicious lesions  Neurological Examination Mental Status: Alert, oriented, thought content appropriate.  Speech fluent without evidence of aphasia.  Able to follow 3 step commands without difficulty. Cranial Nerves: II: Discs flat bilaterally; Visual fields grossly normal, pupils equal, round, reactive to light and  accommodation III,IV, VI: ptosis left eye due to trauma, extra-ocular motions intact bilaterally V,VII: smile symmetric, facial light touch sensation normal bilaterally VIII: hearing normal bilaterally IX,X: uvula rises symmetrically XI: bilateral shoulder shrug XII: midline tongue extension Motor: Right : Upper extremity   5/5    Left:     Upper extremity   5/5  Lower extremity   5/5     Lower extremity   5/5 Tone and bulk:normal tone throughout; no atrophy noted Sensory: Pinprick and light touch intact throughout, bilaterally Deep Tendon Reflexes: 2+ and symmetric throughout Plantars: Right: downgoing   Left: downgoing Cerebellar: normal finger-to-nose, and normal heel-to-shin test Gait: not tested due to multiple leads.        Lab Results: Basic Metabolic Panel:  Recent Labs Lab 05/17/15 1812 05/17/15 1844 05/18/15 0625 05/18/15 0953  NA 139 137 143  --   K 3.8 5.7* 3.5  --   CL 106 103 111  --   CO2 26  --  26  --   GLUCOSE 140* 128* 93  --   BUN 15 25* 9  --   CREATININE 0.86 0.90 0.71  --   CALCIUM 8.7*  --  8.3*  --   MG  --   --   --  1.6*    Liver Function Tests:  Recent Labs Lab 05/17/15 1812 05/18/15 0953  AST 108* 98*  ALT 66* 53  ALKPHOS 82 68  BILITOT 0.9 0.5  PROT 6.4* 5.5*  ALBUMIN 3.5 2.9*   No results for input(s): LIPASE, AMYLASE in the last 168 hours. No results for input(s): AMMONIA in the last 168 hours.  CBC:  Recent Labs Lab 05/17/15 1812 05/17/15 1844 05/18/15 0625  WBC 12.2*  --  8.1  NEUTROABS 10.6*  --   --   HGB 13.1 13.9 11.0*  HCT 39.0 41.0 32.7*  MCV 97.0  --  95.9  PLT 277  --  258    Cardiac Enzymes:  Recent Labs Lab 05/17/15 1812 05/17/15 2129 05/18/15 0005 05/18/15 0625  CKTOTAL 4650*  --   --  2924*  CKMB 85.2*  --   --   --   TROPONINI 1.52* 1.82* 2.51* 2.57*    Lipid Panel: No results for input(s): CHOL, TRIG, HDL, CHOLHDL, VLDL, LDLCALC in the last 168 hours.  CBG:  Recent Labs Lab  05/17/15 1758  GLUCAP 131*    Microbiology: No results found for this or any previous visit.  Coagulation Studies:  Recent Labs  05/17/15 1812  LABPROT 13.9  INR  1.05    Imaging: Ct Head Wo Contrast  05/17/2015   CLINICAL DATA:  Patient fell down stairs  EXAM: CT HEAD WITHOUT CONTRAST  CT MAXILLOFACIAL WITHOUT CONTRAST  CT CERVICAL SPINE WITHOUT CONTRAST  TECHNIQUE: Multidetector CT imaging of the head, cervical spine, and maxillofacial structures were performed using the standard protocol without intravenous contrast. Multiplanar CT image reconstructions of the cervical spine and maxillofacial structures were also generated.  COMPARISON:  None.  FINDINGS: CT HEAD FINDINGS  The ventricles are normal in size and configuration. There is no intracranial mass hemorrhage, extra-axial fluid collection, or midline shift. The gray-white compartments appear normal. No acute infarct is evident. There is a large hematoma overlying the left frontal bone. There is a nondisplaced fracture of the anterior superior left orbital rim extending to the inferior left frontal bone with air along the superior most aspect of the orbit. This air is confined to the intraorbital region. The bony calvarium otherwise appears intact. The mastoid air cells are clear.  CT MAXILLOFACIAL FINDINGS  There is marked soft tissue swelling over the left frontal bone with acute hematoma. There is a fracture of the anterior superior left orbital rim extending into the inferior left frontal bone focal air in the superior posterior aspect of the left orbit. There is a fracture of the medial posterior left maxillary wall. No other fractures are apparent. There is no dislocation. There is no intraorbital lesion beyond the air in the superior posterior left orbit. The globes appear intact as do the extraocular muscles and optic nerve regions.  There is soft tissue swelling over the upper face and preseptal orbital regions bilaterally.  There is  extensive opacification of the left frontal sinus with air-fluid level. There is opacification of multiple ethmoid air cells bilaterally, more on the left than on the right. There is diffuse opacification of much of the left maxillary antrum with an air-fluid level. There is hemorrhage in the upper nares regions bilaterally. There is ostiomeatal unit obstruction bilaterally, presumably due to hemorrhage. There is moderate mucosal thickening in the right maxillary antrum. There is a small air-fluid level in the right sphenoid sinus.  The visualized pharyngeal air column is normal. Salivary glands appear normal. No adenopathy.  CT CERVICAL SPINE FINDINGS  There is no fracture or spondylolisthesis. Prevertebral soft tissues and predental space regions are normal. There is moderately severe disc space narrowing at C6-7 and C7-T1. There is milder narrowing at C4-5 and C5-6. There is mild narrowing at C3-4. There is no nerve root edema or effacement. There is calcification in the posterior longitudinal ligament at C4 and C5. No demonstrable disc extrusion or high-grade stenosis.  There is alveolar opacity in each lung apex, consistent with either edema or possibly contusion.  IMPRESSION: Large soft tissue hematoma over the left frontal bone. Nondisplaced fracture inferior left frontal bone extending to the superior anterior orbital rim. No intracranial mass, hemorrhage, or extra-axial fluid collection. Gray-white compartments appear normal.  CT maxillofacial: Nondisplaced fractures of the anterior superior left orbital rim as well as the posterior left maxillary sinus wall. No other fractures are apparent. No dislocation. Extensive paranasal sinus disease. Obstruction of both ostiomeatal unit complexes, presumably due to blood. Soft tissue swelling noted over both upper facial and preseptal orbital regions. Swelling greater on the left than on the right. More superiorly, there is a hematoma over the left frontal bone.  CT  cervical spine: Areas of osteoarthritic change. Calcification in the posterior longitudinal ligament at C4 and C5.  No acute fracture or spondylolisthesis. Question edema versus contusion in the lung apices bilaterally.   Electronically Signed   By: Bretta Bang III M.D.   On: 05/17/2015 19:58   Ct Cervical Spine Wo Contrast  05/17/2015   CLINICAL DATA:  Patient fell down stairs  EXAM: CT HEAD WITHOUT CONTRAST  CT MAXILLOFACIAL WITHOUT CONTRAST  CT CERVICAL SPINE WITHOUT CONTRAST  TECHNIQUE: Multidetector CT imaging of the head, cervical spine, and maxillofacial structures were performed using the standard protocol without intravenous contrast. Multiplanar CT image reconstructions of the cervical spine and maxillofacial structures were also generated.  COMPARISON:  None.  FINDINGS: CT HEAD FINDINGS  The ventricles are normal in size and configuration. There is no intracranial mass hemorrhage, extra-axial fluid collection, or midline shift. The gray-white compartments appear normal. No acute infarct is evident. There is a large hematoma overlying the left frontal bone. There is a nondisplaced fracture of the anterior superior left orbital rim extending to the inferior left frontal bone with air along the superior most aspect of the orbit. This air is confined to the intraorbital region. The bony calvarium otherwise appears intact. The mastoid air cells are clear.  CT MAXILLOFACIAL FINDINGS  There is marked soft tissue swelling over the left frontal bone with acute hematoma. There is a fracture of the anterior superior left orbital rim extending into the inferior left frontal bone focal air in the superior posterior aspect of the left orbit. There is a fracture of the medial posterior left maxillary wall. No other fractures are apparent. There is no dislocation. There is no intraorbital lesion beyond the air in the superior posterior left orbit. The globes appear intact as do the extraocular muscles and optic  nerve regions.  There is soft tissue swelling over the upper face and preseptal orbital regions bilaterally.  There is extensive opacification of the left frontal sinus with air-fluid level. There is opacification of multiple ethmoid air cells bilaterally, more on the left than on the right. There is diffuse opacification of much of the left maxillary antrum with an air-fluid level. There is hemorrhage in the upper nares regions bilaterally. There is ostiomeatal unit obstruction bilaterally, presumably due to hemorrhage. There is moderate mucosal thickening in the right maxillary antrum. There is a small air-fluid level in the right sphenoid sinus.  The visualized pharyngeal air column is normal. Salivary glands appear normal. No adenopathy.  CT CERVICAL SPINE FINDINGS  There is no fracture or spondylolisthesis. Prevertebral soft tissues and predental space regions are normal. There is moderately severe disc space narrowing at C6-7 and C7-T1. There is milder narrowing at C4-5 and C5-6. There is mild narrowing at C3-4. There is no nerve root edema or effacement. There is calcification in the posterior longitudinal ligament at C4 and C5. No demonstrable disc extrusion or high-grade stenosis.  There is alveolar opacity in each lung apex, consistent with either edema or possibly contusion.  IMPRESSION: Large soft tissue hematoma over the left frontal bone. Nondisplaced fracture inferior left frontal bone extending to the superior anterior orbital rim. No intracranial mass, hemorrhage, or extra-axial fluid collection. Gray-white compartments appear normal.  CT maxillofacial: Nondisplaced fractures of the anterior superior left orbital rim as well as the posterior left maxillary sinus wall. No other fractures are apparent. No dislocation. Extensive paranasal sinus disease. Obstruction of both ostiomeatal unit complexes, presumably due to blood. Soft tissue swelling noted over both upper facial and preseptal orbital  regions. Swelling greater on the left than on the right.  More superiorly, there is a hematoma over the left frontal bone.  CT cervical spine: Areas of osteoarthritic change. Calcification in the posterior longitudinal ligament at C4 and C5. No acute fracture or spondylolisthesis. Question edema versus contusion in the lung apices bilaterally.   Electronically Signed   By: Bretta Bang III M.D.   On: 05/17/2015 19:58   Dg Pelvis Portable  05/17/2015   CLINICAL DATA:  Trauma.  Patient fell today.  EXAM: PORTABLE PELVIS 1-2 VIEWS  COMPARISON:  None.  FINDINGS: The right iliac crest is excluded. No evidence for acute fracture or subluxation. Regional bowel gas pattern is nonobstructed. Surgical clips are noted in the right upper quadrant.  IMPRESSION: 1.  No evidence for acute  abnormality. 2. Right iliac crest excluded.   Electronically Signed   By: Norva Pavlov M.D.   On: 05/17/2015 18:43   Dg Chest Portable 1 View  05/17/2015   CLINICAL DATA:  Pain following fall  EXAM: PORTABLE CHEST - 1 VIEW  COMPARISON:  None.  FINDINGS: There is no edema or consolidation. Heart size and pulmonary vascularity are normal. No adenopathy. No bone lesions.  IMPRESSION: No edema or consolidation.  No pneumothorax.   Electronically Signed   By: Bretta Bang III M.D.   On: 05/17/2015 18:42   Ct Maxillofacial Wo Cm  05/17/2015   CLINICAL DATA:  Patient fell down stairs  EXAM: CT HEAD WITHOUT CONTRAST  CT MAXILLOFACIAL WITHOUT CONTRAST  CT CERVICAL SPINE WITHOUT CONTRAST  TECHNIQUE: Multidetector CT imaging of the head, cervical spine, and maxillofacial structures were performed using the standard protocol without intravenous contrast. Multiplanar CT image reconstructions of the cervical spine and maxillofacial structures were also generated.  COMPARISON:  None.  FINDINGS: CT HEAD FINDINGS  The ventricles are normal in size and configuration. There is no intracranial mass hemorrhage, extra-axial fluid collection, or  midline shift. The gray-white compartments appear normal. No acute infarct is evident. There is a large hematoma overlying the left frontal bone. There is a nondisplaced fracture of the anterior superior left orbital rim extending to the inferior left frontal bone with air along the superior most aspect of the orbit. This air is confined to the intraorbital region. The bony calvarium otherwise appears intact. The mastoid air cells are clear.  CT MAXILLOFACIAL FINDINGS  There is marked soft tissue swelling over the left frontal bone with acute hematoma. There is a fracture of the anterior superior left orbital rim extending into the inferior left frontal bone focal air in the superior posterior aspect of the left orbit. There is a fracture of the medial posterior left maxillary wall. No other fractures are apparent. There is no dislocation. There is no intraorbital lesion beyond the air in the superior posterior left orbit. The globes appear intact as do the extraocular muscles and optic nerve regions.  There is soft tissue swelling over the upper face and preseptal orbital regions bilaterally.  There is extensive opacification of the left frontal sinus with air-fluid level. There is opacification of multiple ethmoid air cells bilaterally, more on the left than on the right. There is diffuse opacification of much of the left maxillary antrum with an air-fluid level. There is hemorrhage in the upper nares regions bilaterally. There is ostiomeatal unit obstruction bilaterally, presumably due to hemorrhage. There is moderate mucosal thickening in the right maxillary antrum. There is a small air-fluid level in the right sphenoid sinus.  The visualized pharyngeal air column is normal. Salivary glands appear normal. No  adenopathy.  CT CERVICAL SPINE FINDINGS  There is no fracture or spondylolisthesis. Prevertebral soft tissues and predental space regions are normal. There is moderately severe disc space narrowing at C6-7  and C7-T1. There is milder narrowing at C4-5 and C5-6. There is mild narrowing at C3-4. There is no nerve root edema or effacement. There is calcification in the posterior longitudinal ligament at C4 and C5. No demonstrable disc extrusion or high-grade stenosis.  There is alveolar opacity in each lung apex, consistent with either edema or possibly contusion.  IMPRESSION: Large soft tissue hematoma over the left frontal bone. Nondisplaced fracture inferior left frontal bone extending to the superior anterior orbital rim. No intracranial mass, hemorrhage, or extra-axial fluid collection. Gray-white compartments appear normal.  CT maxillofacial: Nondisplaced fractures of the anterior superior left orbital rim as well as the posterior left maxillary sinus wall. No other fractures are apparent. No dislocation. Extensive paranasal sinus disease. Obstruction of both ostiomeatal unit complexes, presumably due to blood. Soft tissue swelling noted over both upper facial and preseptal orbital regions. Swelling greater on the left than on the right. More superiorly, there is a hematoma over the left frontal bone.  CT cervical spine: Areas of osteoarthritic change. Calcification in the posterior longitudinal ligament at C4 and C5. No acute fracture or spondylolisthesis. Question edema versus contusion in the lung apices bilaterally.   Electronically Signed   By: Bretta Bang III M.D.   On: 05/17/2015 19:58       Assessment and plan discussed with with attending physician and they are in agreement.    Felicie Morn PA-C [facility] [phone]  05/18/2015, 10:43 AM   Assessment: 54 y.o. male with intermittent episodes of slurred speech and unsteady gait.  Recently suffered fall in which resulted in facial fracture. Unclear etiology. Although it would be atypical for TIA to present the same way multiple time cannot rule out vascular event. Given the seminology is consistently the same would look further  into seizure versus medication effects. Suspect depakote is likely for mood stabilization. Also consider if history of EtOH use is playing a role in these episodes.   Stroke Risk Factors - none   Recommend: 1) EEG 2) MRI/MRA brain 3) Carotid doppler 4) A1c and FLP 5) Consider alternative to Wellbutrin as this can be seizure provoking.

## 2015-05-18 NOTE — Care Management Note (Signed)
Case Management Note  Patient Details  Name: Lexus Barletta MRN: [id number] Date of Birth: 1961/03/09  Subjective/Objective:   Pt admitted on 05/17/15 after being found down by daughter at home s/p fall.  Pt presented with facial and wrist fractures, and rhabdomyolysis.  PTA, pt resided at home with spouse and children.                   Action/Plan: Will follow for discharge planning as pt progresses.    Expected Discharge Date:                  Expected Discharge Plan:  Home w Home Health Services  In-House Referral:     Discharge planning Services  CM Consult  Post Acute Care Choice:    Choice offered to:     DME Arranged:    DME Agency:     HH Arranged:    HH Agency:     Status of Service:  In process, will continue to follow  Medicare Important Message Given:    Date Medicare IM Given:    Medicare IM give by:    Date Additional Medicare IM Given:    Additional Medicare Important Message give by:     If discussed at Long Length of Stay Meetings, dates discussed:    Additional Comments:  Quintella Baton, RN, BSN  Trauma/Neuro ICU Case Manager [phone]

## 2015-05-19 ENCOUNTER — Inpatient Hospital Stay (HOSPITAL_COMMUNITY): Payer: BC Managed Care – PPO

## 2015-05-19 DIAGNOSIS — R079 Chest pain, unspecified: Secondary | ICD-10-CM

## 2015-05-19 DIAGNOSIS — W19XXXD Unspecified fall, subsequent encounter: Secondary | ICD-10-CM

## 2015-05-19 LAB — HEPATIC FUNCTION PANEL
ALK PHOS: 72 U/L (ref 38–126)
ALT: 56 U/L (ref 17–63)
AST: 81 U/L — ABNORMAL HIGH (ref 15–41)
Albumin: 3 g/dL — ABNORMAL LOW (ref 3.5–5.0)
BILIRUBIN DIRECT: 0.1 mg/dL (ref 0.1–0.5)
BILIRUBIN INDIRECT: 0.7 mg/dL (ref 0.3–0.9)
BILIRUBIN TOTAL: 0.8 mg/dL (ref 0.3–1.2)
Total Protein: 5.5 g/dL — ABNORMAL LOW (ref 6.5–8.1)

## 2015-05-19 LAB — BASIC METABOLIC PANEL
Anion gap: 8 (ref 5–15)
BUN: 6 mg/dL (ref 6–20)
CHLORIDE: 105 mmol/L (ref 101–111)
CO2: 24 mmol/L (ref 22–32)
CREATININE: 0.78 mg/dL (ref 0.61–1.24)
Calcium: 8 mg/dL — ABNORMAL LOW (ref 8.9–10.3)
GFR calc Af Amer: >60 mL/min (ref >60)
GFR calc non Af Amer: >60 mL/min (ref >60)
Glucose, Bld: 106 mg/dL — ABNORMAL HIGH (ref 65–99)
Potassium: 3.4 mmol/L — ABNORMAL LOW (ref 3.5–5.1)
SODIUM: 137 mmol/L (ref 135–145)

## 2015-05-19 LAB — CK: Total CK: 1944 U/L — ABNORMAL HIGH (ref 49–397)

## 2015-05-19 LAB — LIPID PANEL
Cholesterol: 132 mg/dL (ref 0–200)
HDL: 56 mg/dL (ref >40)
LDL CALC: 67 mg/dL (ref 0–99)
TRIGLYCERIDES: 46 mg/dL (ref <150)
Total CHOL/HDL Ratio: 2.4 RATIO
VLDL: 9 mg/dL (ref 0–40)

## 2015-05-19 LAB — TROPONIN I: TROPONIN I: 0.79 ng/mL — AB (ref <0.031)

## 2015-05-19 LAB — MAGNESIUM: Magnesium: 1.8 mg/dL (ref 1.7–2.4)

## 2015-05-19 LAB — HEMOGLOBIN A1C
HEMOGLOBIN A1C: 5.2 % (ref 4.8–5.6)
Mean Plasma Glucose: 103 mg/dL

## 2015-05-19 NOTE — Progress Notes (Signed)
Physical Therapy Treatment Patient Details Name: Alvin Allen MRN: [id number] DOB: [birth year] Today's Date: 05/19/2015    History of Present Illness 54 y.o. male admitted to [facility] on 05/17/15 after he fell and was down for an unknown period of time.  Pt with multiple facial fxs, rhabdomyolysis, Elevated triponin suspected due to rhabdo but cannot r/o MI- cardiology is following.  Psych and neurology are also following.  Pt with significant PMHx of bipolar d/o, insomnia,  and gastric bypass surgery.  Pt also has R wrist fx that is splinted.     PT Comments    Patient remains significantly unsteady and very high risk for recurrent fall. Patient with staggering in both directions requiring moderate assist to prevent fall multiple times during ambulation. Patient NWBing RUE therefore unable to use RW for assistive device. Concerned regarding patients poor safety awareness and poor ability to maintain balance with activity.    Follow Up Recommendations  Home health PT;Supervision/Assistance - 24 hour     Equipment Recommendations  None recommended by PT    Recommendations for Other Services       Precautions / Restrictions Precautions Precautions: Fall Precaution Comments: pt very unsteady on his feet with no awareness of his deficits.  Restrictions Weight Bearing Restrictions: Yes RUE Weight Bearing: Non weight bearing    Mobility  Bed Mobility Overal bed mobility: Modified Independent             General bed mobility comments: used railing to pull up to sitting  Transfers Overall transfer level: Needs assistance Equipment used: 1 person hand held assist Transfers: Sit to/from Stand Sit to Stand: Min assist         General transfer comment: Pt with significant increase in sway when going to standing from sitting, anterior and then posterior lean with bil lower legs relying on bed for support and balance.   Ambulation/Gait Ambulation/Gait assistance: Mod  assist Ambulation Distance (Feet): 180 Feet (x2) Assistive device: 1 person hand held assist Gait Pattern/deviations: Step-through pattern;Staggering left;Staggering right;Shuffle;Narrow base of support Gait velocity: decreased Gait velocity interpretation: Below normal speed for age/gender General Gait Details: Patient significantly unsteady during mobility, staggering in both directions with several noted LOB requiring physical assist to correct.   Stairs            Wheelchair Mobility    Modified Rankin (Stroke Patients Only)       Balance   Sitting-balance support: Feet supported Sitting balance-Leahy Scale: Fair Sitting balance - Comments: pt almost fell out of the recliner chair at the end of the session while reaching for something on the bed.    Standing balance support: Single extremity supported Standing balance-Leahy Scale: Poor                      Cognition Arousal/Alertness: Awake/alert Behavior During Therapy: Impulsive Overall Cognitive Status: Impaired/Different from baseline Area of Impairment: Attention;Memory;Following commands;Safety/judgement;Awareness;Problem solving;Rancho level   Current Attention Level: Sustained Memory: Decreased short-term memory (decreased memory surrounding the fall) Following Commands: Follows one step commands consistently;Follows multi-step commands inconsistently Safety/Judgement: Decreased awareness of safety;Decreased awareness of deficits Awareness: Intellectual Problem Solving: Difficulty sequencing;Requires verbal cues;Requires tactile cues      Exercises      General Comments General comments (skin integrity, edema, etc.): patient with NWB ue, significant instability and relying on RUE at times despite VCs for NWBing. Patient with poor safety awareness and continued instability during ambulation      Pertinent Vitals/Pain Pain Assessment: 0-10  Pain Score: 4  Pain Location: right wrist Pain  Descriptors / Indicators: Aching Pain Intervention(s): Limited activity within patient's tolerance;Monitored during session;Repositioned    Home Living                      Prior Function            PT Goals (current goals can now be found in the care plan section) Acute Rehab PT Goals Patient Stated Goal: to go home  PT Goal Formulation: With patient/family Time For Goal Achievement: 06/01/15 Potential to Achieve Goals: Good Progress towards PT goals: Progressing toward goals    Frequency  Min 3X/week    PT Plan Current plan remains appropriate    Co-evaluation             End of Session Equipment Utilized During Treatment: Gait belt Activity Tolerance: Patient tolerated treatment well Patient left: in bed;with call bell/phone within reach     Time: 4098-1191 PT Time Calculation (min) (ACUTE ONLY): 23 min  Charges:  $Gait Training: 8-22 mins $Self Care/Home Management: 8-22                    G CodesFabio Asa 05-23-2015, 5:06 PM Charlotte Crumb, PT DPT  [phone]

## 2015-05-19 NOTE — Progress Notes (Signed)
SLP Cancellation Note  Patient Details Name: Alvin Allen MRN: [id number] DOB: [birth year]   Cancelled treatment:       Reason Eval/Treat Not Completed: Patient at procedure or test/unavailable   Fleet Contras 05/19/2015, 2:47 PM Dimas Aguas, MA, CCC-SLP Acute Rehab SLP [phone]

## 2015-05-19 NOTE — Progress Notes (Signed)
SUBJECTIVE: The patient is doing well today. Wants to go home. At this time, he denies chest pain, shortness of breath, or any new concerns.  Marland Kitchen aspirin  81 mg Oral Daily  . enoxaparin (LOVENOX) injection  40 mg Subcutaneous Q24H  . feeding supplement (ENSURE ENLIVE)  237 mL Oral BID BM  . lamoTRIgine  200 mg Oral Daily  . Levomilnacipran HCl ER  80 mg Oral Daily  . metoprolol tartrate  12.5 mg Oral BID  . nicotine  7 mg Transdermal Daily  . pantoprazole  40 mg Oral Daily      OBJECTIVE: Physical Exam: Filed Vitals:   05/19/15 1200 05/19/15 1300 05/19/15 1400 05/19/15 1500  BP: 111/78     Pulse: 82 91 83 84  Temp: 98.5 F (36.9 C)     TempSrc: Oral     Resp: 16 14 23 16   Height:      Weight:      SpO2: 100% 100% 100% 100%    Intake/Output Summary (Last 24 hours) at 05/19/15 1740 Last data filed at 05/19/15 1300  Gross per 24 hour  Intake   3210 ml  Output   4000 ml  Net   -790 ml    Telemetry reveals sinus rhythm, no arrhythmias  GEN- The patient is disheveled appearing, alert and oriented x 3 today.   Head- moderate ecchymosis over L eye/ face Eyes-  Sclera clear, conjunctiva pink Ears- hearing intact Oropharynx- clear, + tongue fasciculation Neck- supple, no JVP Lungs- Clear to ausculation bilaterally, normal work of breathing Heart- Regular rate and rhythm, no murmurs, rubs or gallops, PMI not laterally displaced GI- soft, NT, ND, + BS Extremities- no clubbing, cyanosis, or edema Skin- no rash or lesion Psych- flat affect Neuro- strength and sensation are intact R arm is bandaged  LABS: Basic Metabolic Panel:  Recent Labs  16/10/96 0625 05/18/15 0953 05/19/15 0221  NA 143  --  137  K 3.5  --  3.4*  CL 111  --  105  CO2 26  --  24  GLUCOSE 93  --  106*  BUN 9  --  6  CREATININE 0.71  --  0.78  CALCIUM 8.3*  --  8.0*  MG  --  1.6* 1.8   Liver Function Tests:  Recent Labs  05/18/15 0953 05/19/15 0221  AST 98* 81*  ALT 53 56  ALKPHOS  68 72  BILITOT 0.5 0.8  PROT 5.5* 5.5*  ALBUMIN 2.9* 3.0*   No results for input(s): LIPASE, AMYLASE in the last 72 hours. CBC:  Recent Labs  05/17/15 1812 05/17/15 1844 05/18/15 0625  WBC 12.2*  --  8.1  NEUTROABS 10.6*  --   --   HGB 13.1 13.9 11.0*  HCT 39.0 41.0 32.7*  MCV 97.0  --  95.9  PLT 277  --  258   Cardiac Enzymes:  Recent Labs  05/17/15 1812  05/18/15 0005 05/18/15 0625 05/19/15 0221  CKTOTAL 4650*  --   --  2924* 1944*  CKMB 85.2*  --   --   --   --   TROPONINI 1.52*  < > 2.51* 2.57* 0.79*  < > = values in this interval not displayed. BNP: Invalid input(s): POCBNP D-Dimer: No results for input(s): DDIMER in the last 72 hours. Hemoglobin A1C:  Recent Labs  05/18/15 1515  HGBA1C 5.2   Fasting Lipid Panel:  Recent Labs  05/19/15 0221  CHOL 132  HDL 56  LDLCALC 67  TRIG 46  CHOLHDL 2.4   Thyroid Function Tests: No results for input(s): TSH, T4TOTAL, T3FREE, THYROIDAB in the last 72 hours.  Invalid input(s): FREET3 Anemia Panel: No results for input(s): VITAMINB12, FOLATE, FERRITIN, TIBC, IRON, RETICCTPCT in the last 72 hours.  RADIOLOGY: Dg Wrist Complete Right  05/18/2015   CLINICAL DATA:  Right wrist pain.  Recent fall.  EXAM: RIGHT WRIST - COMPLETE 3+ VIEW  COMPARISON:  None.  FINDINGS: IV tubing overlies the wrist, obscuring detail. There is a transverse and comminuted fracture the distal radius position with mild displacement mild dorsal angulation. Radiocarpal narrowing is noted. Intercarpal spaces are normal. Distal ulna intact.  IMPRESSION: Comminuted distal radial fracture.   Electronically Signed   By: Norva Pavlov M.D.   On: 05/18/2015 10:47   Ct Head Wo Contrast  05/17/2015   CLINICAL DATA:  Patient fell down stairs  EXAM: CT HEAD WITHOUT CONTRAST  CT MAXILLOFACIAL WITHOUT CONTRAST  CT CERVICAL SPINE WITHOUT CONTRAST  TECHNIQUE: Multidetector CT imaging of the head, cervical spine, and maxillofacial structures were performed  using the standard protocol without intravenous contrast. Multiplanar CT image reconstructions of the cervical spine and maxillofacial structures were also generated.  COMPARISON:  None.  FINDINGS: CT HEAD FINDINGS  The ventricles are normal in size and configuration. There is no intracranial mass hemorrhage, extra-axial fluid collection, or midline shift. The gray-white compartments appear normal. No acute infarct is evident. There is a large hematoma overlying the left frontal bone. There is a nondisplaced fracture of the anterior superior left orbital rim extending to the inferior left frontal bone with air along the superior most aspect of the orbit. This air is confined to the intraorbital region. The bony calvarium otherwise appears intact. The mastoid air cells are clear.  CT MAXILLOFACIAL FINDINGS  There is marked soft tissue swelling over the left frontal bone with acute hematoma. There is a fracture of the anterior superior left orbital rim extending into the inferior left frontal bone focal air in the superior posterior aspect of the left orbit. There is a fracture of the medial posterior left maxillary wall. No other fractures are apparent. There is no dislocation. There is no intraorbital lesion beyond the air in the superior posterior left orbit. The globes appear intact as do the extraocular muscles and optic nerve regions.  There is soft tissue swelling over the upper face and preseptal orbital regions bilaterally.  There is extensive opacification of the left frontal sinus with air-fluid level. There is opacification of multiple ethmoid air cells bilaterally, more on the left than on the right. There is diffuse opacification of much of the left maxillary antrum with an air-fluid level. There is hemorrhage in the upper nares regions bilaterally. There is ostiomeatal unit obstruction bilaterally, presumably due to hemorrhage. There is moderate mucosal thickening in the right maxillary antrum. There is  a small air-fluid level in the right sphenoid sinus.  The visualized pharyngeal air column is normal. Salivary glands appear normal. No adenopathy.  CT CERVICAL SPINE FINDINGS  There is no fracture or spondylolisthesis. Prevertebral soft tissues and predental space regions are normal. There is moderately severe disc space narrowing at C6-7 and C7-T1. There is milder narrowing at C4-5 and C5-6. There is mild narrowing at C3-4. There is no nerve root edema or effacement. There is calcification in the posterior longitudinal ligament at C4 and C5. No demonstrable disc extrusion or high-grade stenosis.  There is alveolar opacity in each lung apex, consistent with either edema  or possibly contusion.  IMPRESSION: Large soft tissue hematoma over the left frontal bone. Nondisplaced fracture inferior left frontal bone extending to the superior anterior orbital rim. No intracranial mass, hemorrhage, or extra-axial fluid collection. Gray-white compartments appear normal.  CT maxillofacial: Nondisplaced fractures of the anterior superior left orbital rim as well as the posterior left maxillary sinus wall. No other fractures are apparent. No dislocation. Extensive paranasal sinus disease. Obstruction of both ostiomeatal unit complexes, presumably due to blood. Soft tissue swelling noted over both upper facial and preseptal orbital regions. Swelling greater on the left than on the right. More superiorly, there is a hematoma over the left frontal bone.  CT cervical spine: Areas of osteoarthritic change. Calcification in the posterior longitudinal ligament at C4 and C5. No acute fracture or spondylolisthesis. Question edema versus contusion in the lung apices bilaterally.   Electronically Signed   By: Bretta Bang III M.D.   On: 05/17/2015 19:58   Ct Cervical Spine Wo Contrast  05/17/2015   CLINICAL DATA:  Patient fell down stairs  EXAM: CT HEAD WITHOUT CONTRAST  CT MAXILLOFACIAL WITHOUT CONTRAST  CT CERVICAL SPINE WITHOUT  CONTRAST  TECHNIQUE: Multidetector CT imaging of the head, cervical spine, and maxillofacial structures were performed using the standard protocol without intravenous contrast. Multiplanar CT image reconstructions of the cervical spine and maxillofacial structures were also generated.  COMPARISON:  None.  FINDINGS: CT HEAD FINDINGS  The ventricles are normal in size and configuration. There is no intracranial mass hemorrhage, extra-axial fluid collection, or midline shift. The gray-white compartments appear normal. No acute infarct is evident. There is a large hematoma overlying the left frontal bone. There is a nondisplaced fracture of the anterior superior left orbital rim extending to the inferior left frontal bone with air along the superior most aspect of the orbit. This air is confined to the intraorbital region. The bony calvarium otherwise appears intact. The mastoid air cells are clear.  CT MAXILLOFACIAL FINDINGS  There is marked soft tissue swelling over the left frontal bone with acute hematoma. There is a fracture of the anterior superior left orbital rim extending into the inferior left frontal bone focal air in the superior posterior aspect of the left orbit. There is a fracture of the medial posterior left maxillary wall. No other fractures are apparent. There is no dislocation. There is no intraorbital lesion beyond the air in the superior posterior left orbit. The globes appear intact as do the extraocular muscles and optic nerve regions.  There is soft tissue swelling over the upper face and preseptal orbital regions bilaterally.  There is extensive opacification of the left frontal sinus with air-fluid level. There is opacification of multiple ethmoid air cells bilaterally, more on the left than on the right. There is diffuse opacification of much of the left maxillary antrum with an air-fluid level. There is hemorrhage in the upper nares regions bilaterally. There is ostiomeatal unit obstruction  bilaterally, presumably due to hemorrhage. There is moderate mucosal thickening in the right maxillary antrum. There is a small air-fluid level in the right sphenoid sinus.  The visualized pharyngeal air column is normal. Salivary glands appear normal. No adenopathy.  CT CERVICAL SPINE FINDINGS  There is no fracture or spondylolisthesis. Prevertebral soft tissues and predental space regions are normal. There is moderately severe disc space narrowing at C6-7 and C7-T1. There is milder narrowing at C4-5 and C5-6. There is mild narrowing at C3-4. There is no nerve root edema or effacement. There is calcification  in the posterior longitudinal ligament at C4 and C5. No demonstrable disc extrusion or high-grade stenosis.  There is alveolar opacity in each lung apex, consistent with either edema or possibly contusion.  IMPRESSION: Large soft tissue hematoma over the left frontal bone. Nondisplaced fracture inferior left frontal bone extending to the superior anterior orbital rim. No intracranial mass, hemorrhage, or extra-axial fluid collection. Gray-white compartments appear normal.  CT maxillofacial: Nondisplaced fractures of the anterior superior left orbital rim as well as the posterior left maxillary sinus wall. No other fractures are apparent. No dislocation. Extensive paranasal sinus disease. Obstruction of both ostiomeatal unit complexes, presumably due to blood. Soft tissue swelling noted over both upper facial and preseptal orbital regions. Swelling greater on the left than on the right. More superiorly, there is a hematoma over the left frontal bone.  CT cervical spine: Areas of osteoarthritic change. Calcification in the posterior longitudinal ligament at C4 and C5. No acute fracture or spondylolisthesis. Question edema versus contusion in the lung apices bilaterally.   Electronically Signed   By: Bretta Bang III M.D.   On: 05/17/2015 19:58   Mr Maxine Glenn Head Wo Contrast  05/19/2015   EXAM: MRI HEAD WITHOUT  CONTRAST  MRA HEAD WITHOUT CONTRAST  TECHNIQUE: Multiplanar, multiecho pulse sequences of the brain and surrounding structures were obtained without intravenous contrast. Angiographic images of the head were obtained using MRA technique without contrast.  COMPARISON:  Prior CT from 05/17/2015.  FINDINGS: MRI HEAD FINDINGS  Prominent left frontal scalp contusion with adjacent soft tissue swelling present. Previously identified nondisplaced fracture through the right orbital roof not well visualized. Scalp soft tissues otherwise unremarkable.  There is a few scattered subcentimeter foci of hypo intense signal intensity seen on gradient echo sequence within the subcortical white matter of the anterior left frontal lobe (series 7, image 20). Associated hyperintense FLAIR/T2 signal intensity. Findings most consistent with tiny small acute parenchymal hemorrhages/contusions. Upon review of the prior CT, small focus of hyperdense blood seen within this region. There is minimal susceptibility artifact on DWI sequence about the most prominent of these foci (series 12, image 27). Additionally, there is a small focus of restricted diffusion within the anterior inferior left frontal lobe, best appreciated on coronal DWI sequence (series 12, image 30). Finding most likely reflects a small focus of traumatic injury/contusion. Additionally, there is a small cortical contusion within the peripheral right temporal lobe (series 6, image 12), consistent with a contrecoup injury. Additional small foci of hemorrhage seen on gradient echo sequence within the cortical gray matter slightly posteriorly within the posterior right temporal lobe (series 7, image 13).  No acute intracranial infarct. Gray-white matter differentiation otherwise maintained. Normal intravascular flow voids preserved.  Cerebral volume within normal limits for patient age. Minimal chronic small vessel ischemic type changes present within the periventricular white  matter.  No mass lesion or midline shift. No mass effect. No hydrocephalus. No extra-axial fluid collection.  Craniocervical junction normal. Pituitary gland within normal limits.  Globes within normal limits. Increased AP diameter about the globes and cells consistent with axial myalgia.  Moderate opacity within the left maxillary sinus. Scattered mucosal thickening within the right maxillary sinus and ethmoidal air cells, as well as the left frontal sinus and sphenoid sinuses. Mild scattered opacity within the mastoid air cells bilaterally, slightly greater on the left. Inner ear structures grossly normal.  Bone marrow signal intensity within normal limits.  MRA HEAD FINDINGS  ANTERIOR CIRCULATION:  Visualized distal cervical segments of the  internal carotid arteries are patent with antegrade flow. The petrous, cavernous, and supraclinoid segments are widely patent. A1 segments, anterior communicating artery, and anterior cerebral arteries widely patent. M1 segments well opacified without stenosis or occlusion. MCA bifurcations normal. Distal MCA branches well opacified bilaterally.  POSTERIOR CIRCULATION:  Right vertebral artery dominant and widely patent to the vertebrobasilar junction. Diminutive left vertebral artery terminates in PICA. Basilar artery mildly irregular, likely related to motion artifact. Basilar artery itself patent without stenosis or occlusion. Superior cerebellar arteries patent bilaterally. P1 segments are slightly hypoplastic bilaterally with prominent posterior communicating arteries. Posterior cerebral arteries well opacified to their distal aspects.  No aneurysm or vascular malformation.  IMPRESSION: MRI HEAD IMPRESSION:  1. Traumatic brain injury with a few small scattered parenchymal contusions within the left frontal lobe and right temporal lobe as detailed above. No significant mass effect. 2. Large left frontotemporal scalp contusion. 3. No other acute intracranial process.  No  infarct. 4. Paranasal sinus disease as above, most severe within the left maxillary sinus.  MRA HEAD IMPRESSION:  Normal MRA of the intracranial circulation.   Electronically Signed   By: Rise Mu M.D.   On: 05/19/2015 02:55   Mr Brain Wo Contrast  05/19/2015   EXAM: MRI HEAD WITHOUT CONTRAST  MRA HEAD WITHOUT CONTRAST  TECHNIQUE: Multiplanar, multiecho pulse sequences of the brain and surrounding structures were obtained without intravenous contrast. Angiographic images of the head were obtained using MRA technique without contrast.  COMPARISON:  Prior CT from 05/17/2015.  FINDINGS: MRI HEAD FINDINGS  Prominent left frontal scalp contusion with adjacent soft tissue swelling present. Previously identified nondisplaced fracture through the right orbital roof not well visualized. Scalp soft tissues otherwise unremarkable.  There is a few scattered subcentimeter foci of hypo intense signal intensity seen on gradient echo sequence within the subcortical white matter of the anterior left frontal lobe (series 7, image 20). Associated hyperintense FLAIR/T2 signal intensity. Findings most consistent with tiny small acute parenchymal hemorrhages/contusions. Upon review of the prior CT, small focus of hyperdense blood seen within this region. There is minimal susceptibility artifact on DWI sequence about the most prominent of these foci (series 12, image 27). Additionally, there is a small focus of restricted diffusion within the anterior inferior left frontal lobe, best appreciated on coronal DWI sequence (series 12, image 30). Finding most likely reflects a small focus of traumatic injury/contusion. Additionally, there is a small cortical contusion within the peripheral right temporal lobe (series 6, image 12), consistent with a contrecoup injury. Additional small foci of hemorrhage seen on gradient echo sequence within the cortical gray matter slightly posteriorly within the posterior right temporal lobe  (series 7, image 13).  No acute intracranial infarct. Gray-white matter differentiation otherwise maintained. Normal intravascular flow voids preserved.  Cerebral volume within normal limits for patient age. Minimal chronic small vessel ischemic type changes present within the periventricular white matter.  No mass lesion or midline shift. No mass effect. No hydrocephalus. No extra-axial fluid collection.  Craniocervical junction normal. Pituitary gland within normal limits.  Globes within normal limits. Increased AP diameter about the globes and cells consistent with axial myalgia.  Moderate opacity within the left maxillary sinus. Scattered mucosal thickening within the right maxillary sinus and ethmoidal air cells, as well as the left frontal sinus and sphenoid sinuses. Mild scattered opacity within the mastoid air cells bilaterally, slightly greater on the left. Inner ear structures grossly normal.  Bone marrow signal intensity within normal limits.  MRA HEAD  FINDINGS  ANTERIOR CIRCULATION:  Visualized distal cervical segments of the internal carotid arteries are patent with antegrade flow. The petrous, cavernous, and supraclinoid segments are widely patent. A1 segments, anterior communicating artery, and anterior cerebral arteries widely patent. M1 segments well opacified without stenosis or occlusion. MCA bifurcations normal. Distal MCA branches well opacified bilaterally.  POSTERIOR CIRCULATION:  Right vertebral artery dominant and widely patent to the vertebrobasilar junction. Diminutive left vertebral artery terminates in PICA. Basilar artery mildly irregular, likely related to motion artifact. Basilar artery itself patent without stenosis or occlusion. Superior cerebellar arteries patent bilaterally. P1 segments are slightly hypoplastic bilaterally with prominent posterior communicating arteries. Posterior cerebral arteries well opacified to their distal aspects.  No aneurysm or vascular malformation.   IMPRESSION: MRI HEAD IMPRESSION:  1. Traumatic brain injury with a few small scattered parenchymal contusions within the left frontal lobe and right temporal lobe as detailed above. No significant mass effect. 2. Large left frontotemporal scalp contusion. 3. No other acute intracranial process.  No infarct. 4. Paranasal sinus disease as above, most severe within the left maxillary sinus.  MRA HEAD IMPRESSION:  Normal MRA of the intracranial circulation.   Electronically Signed   By: Rise Mu M.D.   On: 05/19/2015 02:55   Dg Pelvis Portable  05/17/2015   CLINICAL DATA:  Trauma.  Patient fell today.  EXAM: PORTABLE PELVIS 1-2 VIEWS  COMPARISON:  None.  FINDINGS: The right iliac crest is excluded. No evidence for acute fracture or subluxation. Regional bowel gas pattern is nonobstructed. Surgical clips are noted in the right upper quadrant.  IMPRESSION: 1.  No evidence for acute  abnormality. 2. Right iliac crest excluded.   Electronically Signed   By: Norva Pavlov M.D.   On: 05/17/2015 18:43   Dg Chest Portable 1 View  05/17/2015   CLINICAL DATA:  Pain following fall  EXAM: PORTABLE CHEST - 1 VIEW  COMPARISON:  None.  FINDINGS: There is no edema or consolidation. Heart size and pulmonary vascularity are normal. No adenopathy. No bone lesions.  IMPRESSION: No edema or consolidation.  No pneumothorax.   Electronically Signed   By: Bretta Bang III M.D.   On: 05/17/2015 18:42   Ct Maxillofacial Wo Cm  05/17/2015   CLINICAL DATA:  Patient fell down stairs  EXAM: CT HEAD WITHOUT CONTRAST  CT MAXILLOFACIAL WITHOUT CONTRAST  CT CERVICAL SPINE WITHOUT CONTRAST  TECHNIQUE: Multidetector CT imaging of the head, cervical spine, and maxillofacial structures were performed using the standard protocol without intravenous contrast. Multiplanar CT image reconstructions of the cervical spine and maxillofacial structures were also generated.  COMPARISON:  None.  FINDINGS: CT HEAD FINDINGS  The ventricles are  normal in size and configuration. There is no intracranial mass hemorrhage, extra-axial fluid collection, or midline shift. The gray-white compartments appear normal. No acute infarct is evident. There is a large hematoma overlying the left frontal bone. There is a nondisplaced fracture of the anterior superior left orbital rim extending to the inferior left frontal bone with air along the superior most aspect of the orbit. This air is confined to the intraorbital region. The bony calvarium otherwise appears intact. The mastoid air cells are clear.  CT MAXILLOFACIAL FINDINGS  There is marked soft tissue swelling over the left frontal bone with acute hematoma. There is a fracture of the anterior superior left orbital rim extending into the inferior left frontal bone focal air in the superior posterior aspect of the left orbit. There is a fracture of  the medial posterior left maxillary wall. No other fractures are apparent. There is no dislocation. There is no intraorbital lesion beyond the air in the superior posterior left orbit. The globes appear intact as do the extraocular muscles and optic nerve regions.  There is soft tissue swelling over the upper face and preseptal orbital regions bilaterally.  There is extensive opacification of the left frontal sinus with air-fluid level. There is opacification of multiple ethmoid air cells bilaterally, more on the left than on the right. There is diffuse opacification of much of the left maxillary antrum with an air-fluid level. There is hemorrhage in the upper nares regions bilaterally. There is ostiomeatal unit obstruction bilaterally, presumably due to hemorrhage. There is moderate mucosal thickening in the right maxillary antrum. There is a small air-fluid level in the right sphenoid sinus.  The visualized pharyngeal air column is normal. Salivary glands appear normal. No adenopathy.  CT CERVICAL SPINE FINDINGS  There is no fracture or spondylolisthesis. Prevertebral  soft tissues and predental space regions are normal. There is moderately severe disc space narrowing at C6-7 and C7-T1. There is milder narrowing at C4-5 and C5-6. There is mild narrowing at C3-4. There is no nerve root edema or effacement. There is calcification in the posterior longitudinal ligament at C4 and C5. No demonstrable disc extrusion or high-grade stenosis.  There is alveolar opacity in each lung apex, consistent with either edema or possibly contusion.  IMPRESSION: Large soft tissue hematoma over the left frontal bone. Nondisplaced fracture inferior left frontal bone extending to the superior anterior orbital rim. No intracranial mass, hemorrhage, or extra-axial fluid collection. Gray-white compartments appear normal.  CT maxillofacial: Nondisplaced fractures of the anterior superior left orbital rim as well as the posterior left maxillary sinus wall. No other fractures are apparent. No dislocation. Extensive paranasal sinus disease. Obstruction of both ostiomeatal unit complexes, presumably due to blood. Soft tissue swelling noted over both upper facial and preseptal orbital regions. Swelling greater on the left than on the right. More superiorly, there is a hematoma over the left frontal bone.  CT cervical spine: Areas of osteoarthritic change. Calcification in the posterior longitudinal ligament at C4 and C5. No acute fracture or spondylolisthesis. Question edema versus contusion in the lung apices bilaterally.   Electronically Signed   By: Bretta Bang III M.D.   On: 05/17/2015 19:58    ASSESSMENT AND PLAN:  Active Problems:   Rhabdomyolysis   Elevated troponin   Hyperkalemia   Altered mental status   Fall   Multiple facial fractures   Acute blood loss anemia   Hypomagnesemia   QT prolongation   59M with bipolar disorder (on number of psychotropic meds), seizures, tobacco abuse, former alcohol abuse admitted with altered mental status after not sleeping for 4 days. Found to have  orbital fracture, rhabdomyolysis, hyperkalemia, prolonged QT>573ms, elevated troponin. UDS + THC.    1. Elevated troponin  Likely due to rhabdomyolysis I do not believe that he has had an acute coronary syndrome Would recommend elective outpatient stress testing once he is clinically improved No further cardiology workup planned  2. Prolonged QT Likely due to psychotropic meds/ electrolyte abnormality Keep K >3.9, Mg >1.9 Would reduce/ minimize psychotropic meds as able.   Will defer to psychiatry  3. ? LOC The etiology to is fall/ probable syncope is unclear and likely multifactoral Echo is normal and telemetry is without arrhythmia while here Avoid qt prolonging meds as above 30 day monitor as outpatient No driving  x 6 months (I have instructed patient)  Will arrange outpatient follow-up with our office  Cardiology team to see as needed while here. Please call with questions.  Hillis Range, MD 05/19/2015 5:40 PM

## 2015-05-19 NOTE — Progress Notes (Signed)
  Echocardiogram 2D Echocardiogram has been performed.  Alvin Allen 05/19/2015, 3:20 PM

## 2015-05-19 NOTE — Discharge Instructions (Signed)
Rhabdomyolysis °Rhabdomyolysis is the breakdown of muscle fibers due to injury. The injury may come from physical damage to the muscle like an injury but other causes are: °· High fever (hyperthermia). °· Seizures (convulsions). °· Low phosphate levels. °· Diseases of metabolism. °· Heatstroke. °· Drug toxicity. °· Over exertion. °· Alcoholism. °· Muscle is cut off from oxygen (anoxia). °· The squeezing of nerves and blood vessels (compartment syndrome). °Some drugs which may cause the breakdown of muscle are: °· Antibiotics. °· Statins. °· Alcohol. °· Animal toxins. °Myoglobin is a substance which helps muscle use oxygen. When the muscle is damaged, the myoglobin is released into the bloodstream. It is filtered out of the bloodstream by the kidneys. Myoglobin may block up the kidneys. This may cause damage, such as kidney failure. It also breaks down into other damaging toxic parts, which also cause kidney failure.  °SYMPTOMS  °· Dark, red, or tea colored urine. °· Weakness of affected muscles. °· Weight gain from water retention. °· Joint aches and pains. °· Irregular heart from high potassium in the blood. °· Muscle tenderness or aching. °· Generalized weakness. °· Seizures. °· Feeling tired (fatigue). °DIAGNOSIS  °Your caregiver may find muscle tenderness on exam and suspect the problem. Urine tests and blood work can confirm the problem. °TREATMENT     °· Early and aggressive treatment with large amounts of fluids may help prevent kidney failure. °· Water producing medicine (diuretic) may be used to help flush the kidneys. °· High potassium and calcium problems (electrolyte) in your blood may need treatment. °HOME CARE INSTRUCTIONS  °This problem is usually cared for in a hospital. If you are allowed to go home and require dialysis, make sure you keep all appointments for lab work and dialysis. Not doing so could result in death. °Document Released: 08/07/2004 Document Revised: 11/17/2011 Document Reviewed:  02/19/2009 °[employer]® Patient Information ©2015 [employer]. This information is not intended to replace advice given to you by your health care provider. Make sure you discuss any questions you have with your health care provider. ° °

## 2015-05-19 NOTE — Progress Notes (Signed)
Patient ID: Alvin Allen, male   DOB: July 28, 1961, 54 y.o.   MRN: [id number]   LOS: 2 days   Subjective: No unexpected c/o. Pt and wife report significant improvement in confusion.   Objective: Vital signs in last 24 hours: Temp:  [97.6 F (36.4 C)-99.1 F (37.3 C)] 98.1 F (36.7 C) (09/10 0800) Pulse Rate:  [79-106] 87 (09/10 0800) Resp:  [14-25] 17 (09/10 0800) BP: (97-135)/(74-93) 97/74 mmHg (09/10 0400) SpO2:  [97 %-100 %] 100 % (09/10 0800) Last BM Date: 05/18/15   Laboratory  BMET  Recent Labs  05/18/15 0625 05/19/15 0221  NA 143 137  K 3.5 3.4*  CL 111 105  CO2 26 24  GLUCOSE 93 106*  BUN 9 6  CREATININE 0.71 0.78  CALCIUM 8.3* 8.0*  Cardiac Panel (last 3 results)  Recent Labs  05/17/15 1812  05/18/15 0005 05/18/15 0625 05/19/15 0221  CKTOTAL 4650*  --   --  2924* 1944*  CKMB 85.2*  --   --   --   --   TROPONINI 1.52*  < > 2.51* 2.57* 0.79*  RELINDX 1.8  --   --   --   --   < > = values in this interval not displayed.   Radiology Results MRI HEAD WITHOUT CONTRAST  MRA HEAD WITHOUT CONTRAST  TECHNIQUE: Multiplanar, multiecho pulse sequences of the brain and surrounding structures were obtained without intravenous contrast. Angiographic images of the head were obtained using MRA technique without contrast.  COMPARISON: Prior CT from 05/17/2015.  FINDINGS: MRI HEAD FINDINGS  Prominent left frontal scalp contusion with adjacent soft tissue swelling present. Previously identified nondisplaced fracture through the right orbital roof not well visualized. Scalp soft tissues otherwise unremarkable.  There is a few scattered subcentimeter foci of hypo intense signal intensity seen on gradient echo sequence within the subcortical white matter of the anterior left frontal lobe (series 7, image 20). Associated hyperintense FLAIR/T2 signal intensity. Findings most consistent with tiny small acute parenchymal hemorrhages/contusions. Upon  review of the prior CT, small focus of hyperdense blood seen within this region. There is minimal susceptibility artifact on DWI sequence about the most prominent of these foci (series 12, image 27). Additionally, there is a small focus of restricted diffusion within the anterior inferior left frontal lobe, best appreciated on coronal DWI sequence (series 12, image 30). Finding most likely reflects a small focus of traumatic injury/contusion. Additionally, there is a small cortical contusion within the peripheral right temporal lobe (series 6, image 12), consistent with a contrecoup injury. Additional small foci of hemorrhage seen on gradient echo sequence within the cortical gray matter slightly posteriorly within the posterior right temporal lobe (series 7, image 13).  No acute intracranial infarct. Gray-white matter differentiation otherwise maintained. Normal intravascular flow voids preserved.  Cerebral volume within normal limits for patient age. Minimal chronic small vessel ischemic type changes present within the periventricular white matter.  No mass lesion or midline shift. No mass effect. No hydrocephalus. No extra-axial fluid collection.  Craniocervical junction normal. Pituitary gland within normal limits.  Globes within normal limits. Increased AP diameter about the globes and cells consistent with axial myalgia.  Moderate opacity within the left maxillary sinus. Scattered mucosal thickening within the right maxillary sinus and ethmoidal air cells, as well as the left frontal sinus and sphenoid sinuses. Mild scattered opacity within the mastoid air cells bilaterally, slightly greater on the left. Inner ear structures grossly normal.  Bone marrow signal intensity within normal limits.  MRA HEAD FINDINGS  ANTERIOR CIRCULATION:  Visualized distal cervical segments of the internal carotid arteries are patent with antegrade flow. The petrous, cavernous,  and supraclinoid segments are widely patent. A1 segments, anterior communicating artery, and anterior cerebral arteries widely patent. M1 segments well opacified without stenosis or occlusion. MCA bifurcations normal. Distal MCA branches well opacified bilaterally.  POSTERIOR CIRCULATION:  Right vertebral artery dominant and widely patent to the vertebrobasilar junction. Diminutive left vertebral artery terminates in PICA. Basilar artery mildly irregular, likely related to motion artifact. Basilar artery itself patent without stenosis or occlusion. Superior cerebellar arteries patent bilaterally. P1 segments are slightly hypoplastic bilaterally with prominent posterior communicating arteries. Posterior cerebral arteries well opacified to their distal aspects.  No aneurysm or vascular malformation.  IMPRESSION: MRI HEAD IMPRESSION:  1. Traumatic brain injury with a few small scattered parenchymal contusions within the left frontal lobe and right temporal lobe as detailed above. No significant mass effect. 2. Large left frontotemporal scalp contusion. 3. No other acute intracranial process. No infarct. 4. Paranasal sinus disease as above, most severe within the left maxillary sinus.  MRA HEAD IMPRESSION:  Normal MRA of the intracranial circulation.   Electronically Signed  By: Rise Mu M.D.  On: 05/19/2015 02:55   Physical Exam General appearance: alert and no distress Resp: clear to auscultation bilaterally Cardio: regular rate and rhythm GI: normal findings: bowel sounds normal and soft, non-tender Extremities: NVI   Assessment/Plan: Fall Concussion -- Likely cause of confusion Multiple facial fxs -- No consult to date, appears Dr. Kelly Splinter did not get called. She will see as OP. ABL anemia -- Mild Rhabdomyolysis -- CK improved from 4650 to 1944 AMI -- Troponin down today, cardiology following Bipolar disorder -- Home meds FEN -- Give  diet VTE -- SCD's, Lovenox Dispo -- Transfer to telemetry, can likely D/C home when cleared by cardiology    Freeman Caldron, PA-C Pager: [phone] General Trauma PA Pager: [phone]  05/19/2015

## 2015-05-19 NOTE — Progress Notes (Signed)
Subjective: No overnight events. 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 EEG is normal.   Objective: Current vital signs: BP 97/74 mmHg  Pulse 87  Temp(Src) 98.1 F (36.7 C) (Axillary)  Resp 17  Ht 5\' 10"  (1.778 m)  Wt 83.2 kg (183 lb 6.8 oz)  BMI 26.32 kg/m2  SpO2 100% Vital signs in last 24 hours: Temp:  [97.6 F (36.4 C)-99.1 F (37.3 C)] 98.1 F (36.7 C) (09/10 0800) Pulse Rate:  [79-106] 87 (09/10 0800) Resp:  [14-25] 17 (09/10 0800) BP: (97-135)/(74-93) 97/74 mmHg (09/10 0400) SpO2:  [97 %-100 %] 100 % (09/10 0800)  Intake/Output from previous day: 09/09 0701 - 09/10 0700 In: 3894.6 [P.O.:1400; I.V.:2444.6; IV Piggyback:50] Out: 5675 [Urine:5675] Intake/Output this shift: Total I/O In: 100 [I.V.:100] Out: 350 [Urine:350] Nutritional status: Diet regular Room service appropriate?: Yes; Fluid consistency:: Thin  Neurologic Exam: Mental Status: Alert, oriented, thought content appropriate. Speech fluent without evidence of aphasia.  Cranial Nerves: II: pupils equal, round, reactive to light  III,IV, VI: ptosis left eye due to trauma, extra-ocular motions intact bilaterally V,VII: smile symmetric, facial light touch sensation normal bilaterally Motor: Right :Upper extremity 5/5Left: Upper extremity 5/5 Lower extremity 5/5Lower extremity 5/5 Tone and bulk:normal tone throughout; no atrophy noted Sensory:  light touch intact throughout, bilaterally  Lab Results: Basic Metabolic Panel:  Recent Labs Lab 05/17/15 1812 05/17/15 1844 05/18/15 0625 05/18/15 0953 05/19/15 0221  NA 139 137 143  --  137  K 3.8 5.7* 3.5  --  3.4*  CL 106 103 111  --  105  CO2 26  --  26  --  24  GLUCOSE 140* 128* 93  --  106*  BUN 15 25* 9  --  6  CREATININE 0.86 0.90 0.71  --  0.78  CALCIUM 8.7*  --  8.3*  --  8.0*  MG  --   --   --  1.6* 1.8     Liver Function Tests:  Recent Labs Lab 05/17/15 1812 05/18/15 0953 05/19/15 0221  AST 108* 98* 81*  ALT 66* 53 56  ALKPHOS 82 68 72  BILITOT 0.9 0.5 0.8  PROT 6.4* 5.5* 5.5*  ALBUMIN 3.5 2.9* 3.0*   No results for input(s): LIPASE, AMYLASE in the last 168 hours. No results for input(s): AMMONIA in the last 168 hours.  CBC:  Recent Labs Lab 05/17/15 1812 05/17/15 1844 05/18/15 0625  WBC 12.2*  --  8.1  NEUTROABS 10.6*  --   --   HGB 13.1 13.9 11.0*  HCT 39.0 41.0 32.7*  MCV 97.0  --  95.9  PLT 277  --  258    Cardiac Enzymes:  Recent Labs Lab 05/17/15 1812 05/17/15 2129 05/18/15 0005 05/18/15 0625 05/19/15 0221  CKTOTAL 4650*  --   --  2924* 1944*  CKMB 85.2*  --   --   --   --   TROPONINI 1.52* 1.82* 2.51* 2.57* 0.79*    Lipid Panel:  Recent Labs Lab 05/19/15 0221  CHOL 132  TRIG 46  HDL 56  CHOLHDL 2.4  VLDL 9  LDLCALC 67    CBG:  Recent Labs Lab 05/17/15 1758  GLUCAP 131*    Microbiology: No results found for this or any previous visit.  Coagulation Studies:  Recent Labs  05/17/15 1812  LABPROT 13.9  INR 1.05    Imaging: Dg Wrist Complete Right  05/18/2015   CLINICAL DATA:  Right wrist pain.  Recent fall.  EXAM: RIGHT WRIST - COMPLETE 3+ VIEW  COMPARISON:  None.  FINDINGS: IV tubing overlies the wrist, obscuring detail. There is a transverse and comminuted fracture the distal radius position with mild displacement mild dorsal angulation. Radiocarpal narrowing is noted. Intercarpal spaces are normal. Distal ulna intact.  IMPRESSION: Comminuted distal radial fracture.   Electronically Signed   By: Norva Pavlov M.D.   On: 05/18/2015 10:47   Ct Head Wo Contrast  05/17/2015   CLINICAL DATA:  Patient fell down stairs  EXAM: CT HEAD WITHOUT CONTRAST  CT MAXILLOFACIAL WITHOUT CONTRAST  CT CERVICAL SPINE WITHOUT CONTRAST  TECHNIQUE: Multidetector CT imaging of the head, cervical spine, and maxillofacial structures were performed  using the standard protocol without intravenous contrast. Multiplanar CT image reconstructions of the cervical spine and maxillofacial structures were also generated.  COMPARISON:  None.  FINDINGS: CT HEAD FINDINGS  The ventricles are normal in size and configuration. There is no intracranial mass hemorrhage, extra-axial fluid collection, or midline shift. The gray-white compartments appear normal. No acute infarct is evident. There is a large hematoma overlying the left frontal bone. There is a nondisplaced fracture of the anterior superior left orbital rim extending to the inferior left frontal bone with air along the superior most aspect of the orbit. This air is confined to the intraorbital region. The bony calvarium otherwise appears intact. The mastoid air cells are clear.  CT MAXILLOFACIAL FINDINGS  There is marked soft tissue swelling over the left frontal bone with acute hematoma. There is a fracture of the anterior superior left orbital rim extending into the inferior left frontal bone focal air in the superior posterior aspect of the left orbit. There is a fracture of the medial posterior left maxillary wall. No other fractures are apparent. There is no dislocation. There is no intraorbital lesion beyond the air in the superior posterior left orbit. The globes appear intact as do the extraocular muscles and optic nerve regions.  There is soft tissue swelling over the upper face and preseptal orbital regions bilaterally.  There is extensive opacification of the left frontal sinus with air-fluid level. There is opacification of multiple ethmoid air cells bilaterally, more on the left than on the right. There is diffuse opacification of much of the left maxillary antrum with an air-fluid level. There is hemorrhage in the upper nares regions bilaterally. There is ostiomeatal unit obstruction bilaterally, presumably due to hemorrhage. There is moderate mucosal thickening in the right maxillary antrum. There is  a small air-fluid level in the right sphenoid sinus.  The visualized pharyngeal air column is normal. Salivary glands appear normal. No adenopathy.  CT CERVICAL SPINE FINDINGS  There is no fracture or spondylolisthesis. Prevertebral soft tissues and predental space regions are normal. There is moderately severe disc space narrowing at C6-7 and C7-T1. There is milder narrowing at C4-5 and C5-6. There is mild narrowing at C3-4. There is no nerve root edema or effacement. There is calcification in the posterior longitudinal ligament at C4 and C5. No demonstrable disc extrusion or high-grade stenosis.  There is alveolar opacity in each lung apex, consistent with either edema or possibly contusion.  IMPRESSION: Large soft tissue hematoma over the left frontal bone. Nondisplaced fracture inferior left frontal bone extending to the superior anterior orbital rim. No intracranial mass, hemorrhage, or extra-axial fluid collection. Gray-white compartments appear normal.  CT maxillofacial: Nondisplaced fractures of the anterior superior left orbital rim as well as the posterior left maxillary sinus wall. No other fractures are  apparent. No dislocation. Extensive paranasal sinus disease. Obstruction of both ostiomeatal unit complexes, presumably due to blood. Soft tissue swelling noted over both upper facial and preseptal orbital regions. Swelling greater on the left than on the right. More superiorly, there is a hematoma over the left frontal bone.  CT cervical spine: Areas of osteoarthritic change. Calcification in the posterior longitudinal ligament at C4 and C5. No acute fracture or spondylolisthesis. Question edema versus contusion in the lung apices bilaterally.   Electronically Signed   By: Bretta Bang III M.D.   On: 05/17/2015 19:58   Ct Cervical Spine Wo Contrast  05/17/2015   CLINICAL DATA:  Patient fell down stairs  EXAM: CT HEAD WITHOUT CONTRAST  CT MAXILLOFACIAL WITHOUT CONTRAST  CT CERVICAL SPINE WITHOUT  CONTRAST  TECHNIQUE: Multidetector CT imaging of the head, cervical spine, and maxillofacial structures were performed using the standard protocol without intravenous contrast. Multiplanar CT image reconstructions of the cervical spine and maxillofacial structures were also generated.  COMPARISON:  None.  FINDINGS: CT HEAD FINDINGS  The ventricles are normal in size and configuration. There is no intracranial mass hemorrhage, extra-axial fluid collection, or midline shift. The gray-white compartments appear normal. No acute infarct is evident. There is a large hematoma overlying the left frontal bone. There is a nondisplaced fracture of the anterior superior left orbital rim extending to the inferior left frontal bone with air along the superior most aspect of the orbit. This air is confined to the intraorbital region. The bony calvarium otherwise appears intact. The mastoid air cells are clear.  CT MAXILLOFACIAL FINDINGS  There is marked soft tissue swelling over the left frontal bone with acute hematoma. There is a fracture of the anterior superior left orbital rim extending into the inferior left frontal bone focal air in the superior posterior aspect of the left orbit. There is a fracture of the medial posterior left maxillary wall. No other fractures are apparent. There is no dislocation. There is no intraorbital lesion beyond the air in the superior posterior left orbit. The globes appear intact as do the extraocular muscles and optic nerve regions.  There is soft tissue swelling over the upper face and preseptal orbital regions bilaterally.  There is extensive opacification of the left frontal sinus with air-fluid level. There is opacification of multiple ethmoid air cells bilaterally, more on the left than on the right. There is diffuse opacification of much of the left maxillary antrum with an air-fluid level. There is hemorrhage in the upper nares regions bilaterally. There is ostiomeatal unit obstruction  bilaterally, presumably due to hemorrhage. There is moderate mucosal thickening in the right maxillary antrum. There is a small air-fluid level in the right sphenoid sinus.  The visualized pharyngeal air column is normal. Salivary glands appear normal. No adenopathy.  CT CERVICAL SPINE FINDINGS  There is no fracture or spondylolisthesis. Prevertebral soft tissues and predental space regions are normal. There is moderately severe disc space narrowing at C6-7 and C7-T1. There is milder narrowing at C4-5 and C5-6. There is mild narrowing at C3-4. There is no nerve root edema or effacement. There is calcification in the posterior longitudinal ligament at C4 and C5. No demonstrable disc extrusion or high-grade stenosis.  There is alveolar opacity in each lung apex, consistent with either edema or possibly contusion.  IMPRESSION: Large soft tissue hematoma over the left frontal bone. Nondisplaced fracture inferior left frontal bone extending to the superior anterior orbital rim. No intracranial mass, hemorrhage, or extra-axial fluid collection.  Gray-white compartments appear normal.  CT maxillofacial: Nondisplaced fractures of the anterior superior left orbital rim as well as the posterior left maxillary sinus wall. No other fractures are apparent. No dislocation. Extensive paranasal sinus disease. Obstruction of both ostiomeatal unit complexes, presumably due to blood. Soft tissue swelling noted over both upper facial and preseptal orbital regions. Swelling greater on the left than on the right. More superiorly, there is a hematoma over the left frontal bone.  CT cervical spine: Areas of osteoarthritic change. Calcification in the posterior longitudinal ligament at C4 and C5. No acute fracture or spondylolisthesis. Question edema versus contusion in the lung apices bilaterally.   Electronically Signed   By: Bretta Bang III M.D.   On: 05/17/2015 19:58   Mr Maxine Glenn Head Wo Contrast  05/19/2015   EXAM: MRI HEAD WITHOUT  CONTRAST  MRA HEAD WITHOUT CONTRAST  TECHNIQUE: Multiplanar, multiecho pulse sequences of the brain and surrounding structures were obtained without intravenous contrast. Angiographic images of the head were obtained using MRA technique without contrast.  COMPARISON:  Prior CT from 05/17/2015.  FINDINGS: MRI HEAD FINDINGS  Prominent left frontal scalp contusion with adjacent soft tissue swelling present. Previously identified nondisplaced fracture through the right orbital roof not well visualized. Scalp soft tissues otherwise unremarkable.  There is a few scattered subcentimeter foci of hypo intense signal intensity seen on gradient echo sequence within the subcortical white matter of the anterior left frontal lobe (series 7, image 20). Associated hyperintense FLAIR/T2 signal intensity. Findings most consistent with tiny small acute parenchymal hemorrhages/contusions. Upon review of the prior CT, small focus of hyperdense blood seen within this region. There is minimal susceptibility artifact on DWI sequence about the most prominent of these foci (series 12, image 27). Additionally, there is a small focus of restricted diffusion within the anterior inferior left frontal lobe, best appreciated on coronal DWI sequence (series 12, image 30). Finding most likely reflects a small focus of traumatic injury/contusion. Additionally, there is a small cortical contusion within the peripheral right temporal lobe (series 6, image 12), consistent with a contrecoup injury. Additional small foci of hemorrhage seen on gradient echo sequence within the cortical gray matter slightly posteriorly within the posterior right temporal lobe (series 7, image 13).  No acute intracranial infarct. Gray-white matter differentiation otherwise maintained. Normal intravascular flow voids preserved.  Cerebral volume within normal limits for patient age. Minimal chronic small vessel ischemic type changes present within the periventricular white  matter.  No mass lesion or midline shift. No mass effect. No hydrocephalus. No extra-axial fluid collection.  Craniocervical junction normal. Pituitary gland within normal limits.  Globes within normal limits. Increased AP diameter about the globes and cells consistent with axial myalgia.  Moderate opacity within the left maxillary sinus. Scattered mucosal thickening within the right maxillary sinus and ethmoidal air cells, as well as the left frontal sinus and sphenoid sinuses. Mild scattered opacity within the mastoid air cells bilaterally, slightly greater on the left. Inner ear structures grossly normal.  Bone marrow signal intensity within normal limits.  MRA HEAD FINDINGS  ANTERIOR CIRCULATION:  Visualized distal cervical segments of the internal carotid arteries are patent with antegrade flow. The petrous, cavernous, and supraclinoid segments are widely patent. A1 segments, anterior communicating artery, and anterior cerebral arteries widely patent. M1 segments well opacified without stenosis or occlusion. MCA bifurcations normal. Distal MCA branches well opacified bilaterally.  POSTERIOR CIRCULATION:  Right vertebral artery dominant and widely patent to the vertebrobasilar junction. Diminutive left vertebral artery  terminates in PICA. Basilar artery mildly irregular, likely related to motion artifact. Basilar artery itself patent without stenosis or occlusion. Superior cerebellar arteries patent bilaterally. P1 segments are slightly hypoplastic bilaterally with prominent posterior communicating arteries. Posterior cerebral arteries well opacified to their distal aspects.  No aneurysm or vascular malformation.  IMPRESSION: MRI HEAD IMPRESSION:  1. Traumatic brain injury with a few small scattered parenchymal contusions within the left frontal lobe and right temporal lobe as detailed above. No significant mass effect. 2. Large left frontotemporal scalp contusion. 3. No other acute intracranial process.  No  infarct. 4. Paranasal sinus disease as above, most severe within the left maxillary sinus.  MRA HEAD IMPRESSION:  Normal MRA of the intracranial circulation.   Electronically Signed   By: Rise Mu M.D.   On: 05/19/2015 02:55   Mr Brain Wo Contrast  05/19/2015   EXAM: MRI HEAD WITHOUT CONTRAST  MRA HEAD WITHOUT CONTRAST  TECHNIQUE: Multiplanar, multiecho pulse sequences of the brain and surrounding structures were obtained without intravenous contrast. Angiographic images of the head were obtained using MRA technique without contrast.  COMPARISON:  Prior CT from 05/17/2015.  FINDINGS: MRI HEAD FINDINGS  Prominent left frontal scalp contusion with adjacent soft tissue swelling present. Previously identified nondisplaced fracture through the right orbital roof not well visualized. Scalp soft tissues otherwise unremarkable.  There is a few scattered subcentimeter foci of hypo intense signal intensity seen on gradient echo sequence within the subcortical white matter of the anterior left frontal lobe (series 7, image 20). Associated hyperintense FLAIR/T2 signal intensity. Findings most consistent with tiny small acute parenchymal hemorrhages/contusions. Upon review of the prior CT, small focus of hyperdense blood seen within this region. There is minimal susceptibility artifact on DWI sequence about the most prominent of these foci (series 12, image 27). Additionally, there is a small focus of restricted diffusion within the anterior inferior left frontal lobe, best appreciated on coronal DWI sequence (series 12, image 30). Finding most likely reflects a small focus of traumatic injury/contusion. Additionally, there is a small cortical contusion within the peripheral right temporal lobe (series 6, image 12), consistent with a contrecoup injury. Additional small foci of hemorrhage seen on gradient echo sequence within the cortical gray matter slightly posteriorly within the posterior right temporal lobe  (series 7, image 13).  No acute intracranial infarct. Gray-white matter differentiation otherwise maintained. Normal intravascular flow voids preserved.  Cerebral volume within normal limits for patient age. Minimal chronic small vessel ischemic type changes present within the periventricular white matter.  No mass lesion or midline shift. No mass effect. No hydrocephalus. No extra-axial fluid collection.  Craniocervical junction normal. Pituitary gland within normal limits.  Globes within normal limits. Increased AP diameter about the globes and cells consistent with axial myalgia.  Moderate opacity within the left maxillary sinus. Scattered mucosal thickening within the right maxillary sinus and ethmoidal air cells, as well as the left frontal sinus and sphenoid sinuses. Mild scattered opacity within the mastoid air cells bilaterally, slightly greater on the left. Inner ear structures grossly normal.  Bone marrow signal intensity within normal limits.  MRA HEAD FINDINGS  ANTERIOR CIRCULATION:  Visualized distal cervical segments of the internal carotid arteries are patent with antegrade flow. The petrous, cavernous, and supraclinoid segments are widely patent. A1 segments, anterior communicating artery, and anterior cerebral arteries widely patent. M1 segments well opacified without stenosis or occlusion. MCA bifurcations normal. Distal MCA branches well opacified bilaterally.  POSTERIOR CIRCULATION:  Right vertebral artery dominant  and widely patent to the vertebrobasilar junction. Diminutive left vertebral artery terminates in PICA. Basilar artery mildly irregular, likely related to motion artifact. Basilar artery itself patent without stenosis or occlusion. Superior cerebellar arteries patent bilaterally. P1 segments are slightly hypoplastic bilaterally with prominent posterior communicating arteries. Posterior cerebral arteries well opacified to their distal aspects.  No aneurysm or vascular malformation.   IMPRESSION: MRI HEAD IMPRESSION:  1. Traumatic brain injury with a few small scattered parenchymal contusions within the left frontal lobe and right temporal lobe as detailed above. No significant mass effect. 2. Large left frontotemporal scalp contusion. 3. No other acute intracranial process.  No infarct. 4. Paranasal sinus disease as above, most severe within the left maxillary sinus.  MRA HEAD IMPRESSION:  Normal MRA of the intracranial circulation.   Electronically Signed   By: Rise Mu M.D.   On: 05/19/2015 02:55   Dg Pelvis Portable  05/17/2015   CLINICAL DATA:  Trauma.  Patient fell today.  EXAM: PORTABLE PELVIS 1-2 VIEWS  COMPARISON:  None.  FINDINGS: The right iliac crest is excluded. No evidence for acute fracture or subluxation. Regional bowel gas pattern is nonobstructed. Surgical clips are noted in the right upper quadrant.  IMPRESSION: 1.  No evidence for acute  abnormality. 2. Right iliac crest excluded.   Electronically Signed   By: Norva Pavlov M.D.   On: 05/17/2015 18:43   Dg Chest Portable 1 View  05/17/2015   CLINICAL DATA:  Pain following fall  EXAM: PORTABLE CHEST - 1 VIEW  COMPARISON:  None.  FINDINGS: There is no edema or consolidation. Heart size and pulmonary vascularity are normal. No adenopathy. No bone lesions.  IMPRESSION: No edema or consolidation.  No pneumothorax.   Electronically Signed   By: Bretta Bang III M.D.   On: 05/17/2015 18:42   Ct Maxillofacial Wo Cm  05/17/2015   CLINICAL DATA:  Patient fell down stairs  EXAM: CT HEAD WITHOUT CONTRAST  CT MAXILLOFACIAL WITHOUT CONTRAST  CT CERVICAL SPINE WITHOUT CONTRAST  TECHNIQUE: Multidetector CT imaging of the head, cervical spine, and maxillofacial structures were performed using the standard protocol without intravenous contrast. Multiplanar CT image reconstructions of the cervical spine and maxillofacial structures were also generated.  COMPARISON:  None.  FINDINGS: CT HEAD FINDINGS  The ventricles are  normal in size and configuration. There is no intracranial mass hemorrhage, extra-axial fluid collection, or midline shift. The gray-white compartments appear normal. No acute infarct is evident. There is a large hematoma overlying the left frontal bone. There is a nondisplaced fracture of the anterior superior left orbital rim extending to the inferior left frontal bone with air along the superior most aspect of the orbit. This air is confined to the intraorbital region. The bony calvarium otherwise appears intact. The mastoid air cells are clear.  CT MAXILLOFACIAL FINDINGS  There is marked soft tissue swelling over the left frontal bone with acute hematoma. There is a fracture of the anterior superior left orbital rim extending into the inferior left frontal bone focal air in the superior posterior aspect of the left orbit. There is a fracture of the medial posterior left maxillary wall. No other fractures are apparent. There is no dislocation. There is no intraorbital lesion beyond the air in the superior posterior left orbit. The globes appear intact as do the extraocular muscles and optic nerve regions.  There is soft tissue swelling over the upper face and preseptal orbital regions bilaterally.  There is extensive opacification of the  left frontal sinus with air-fluid level. There is opacification of multiple ethmoid air cells bilaterally, more on the left than on the right. There is diffuse opacification of much of the left maxillary antrum with an air-fluid level. There is hemorrhage in the upper nares regions bilaterally. There is ostiomeatal unit obstruction bilaterally, presumably due to hemorrhage. There is moderate mucosal thickening in the right maxillary antrum. There is a small air-fluid level in the right sphenoid sinus.  The visualized pharyngeal air column is normal. Salivary glands appear normal. No adenopathy.  CT CERVICAL SPINE FINDINGS  There is no fracture or spondylolisthesis. Prevertebral  soft tissues and predental space regions are normal. There is moderately severe disc space narrowing at C6-7 and C7-T1. There is milder narrowing at C4-5 and C5-6. There is mild narrowing at C3-4. There is no nerve root edema or effacement. There is calcification in the posterior longitudinal ligament at C4 and C5. No demonstrable disc extrusion or high-grade stenosis.  There is alveolar opacity in each lung apex, consistent with either edema or possibly contusion.  IMPRESSION: Large soft tissue hematoma over the left frontal bone. Nondisplaced fracture inferior left frontal bone extending to the superior anterior orbital rim. No intracranial mass, hemorrhage, or extra-axial fluid collection. Gray-white compartments appear normal.  CT maxillofacial: Nondisplaced fractures of the anterior superior left orbital rim as well as the posterior left maxillary sinus wall. No other fractures are apparent. No dislocation. Extensive paranasal sinus disease. Obstruction of both ostiomeatal unit complexes, presumably due to blood. Soft tissue swelling noted over both upper facial and preseptal orbital regions. Swelling greater on the left than on the right. More superiorly, there is a hematoma over the left frontal bone.  CT cervical spine: Areas of osteoarthritic change. Calcification in the posterior longitudinal ligament at C4 and C5. No acute fracture or spondylolisthesis. Question edema versus contusion in the lung apices bilaterally.   Electronically Signed   By: Bretta Bang III M.D.   On: 05/17/2015 19:58    Medications:  Scheduled: . aspirin  81 mg Oral Daily  . enoxaparin (LOVENOX) injection  40 mg Subcutaneous Q24H  . feeding supplement (ENSURE ENLIVE)  237 mL Oral BID BM  . lamoTRIgine  200 mg Oral Daily  . Levomilnacipran HCl ER  80 mg Oral Daily  . metoprolol tartrate  12.5 mg Oral BID  . nicotine  7 mg Transdermal Daily  . pantoprazole  40 mg Oral Daily   Or  . pantoprazole (PROTONIX) IV  40 mg  Intravenous Daily    Assessment/Plan: 54 y.o. male with intermittent episodes of slurred speech and unsteady gait. Recently suffered fall in which resulted in facial fracture. Unclear etiology. Although it would be atypical for TIA to present the same way multiple time cannot rule out vascular event. Given the seminology is consistently the same would look further into seizure versus medication effects. Suspect depakote is likely for mood stabilization. Also consider if history of EtOH use is playing a role in these episodes.   MRI imaging and EEG unremarkable so far. Awaiting carotid doppler.   -continue ASA  daily -f/u carotid doppler. If unremarkable no further neurological workup indicated at this time -can follow up with outpatient neurologist for more formal evaluation of gait instability as exam is limited at this time    LOS: 2 days   Elspeth Cho, DO [facility] [phone]  If 7pm- 7am, please page neurology on call as listed in [facility]ION. 05/19/2015  8:51 AM

## 2015-05-21 ENCOUNTER — Emergency Department (HOSPITAL_COMMUNITY): Payer: BC Managed Care – PPO

## 2015-05-21 ENCOUNTER — Emergency Department (HOSPITAL_COMMUNITY)
Admission: EM | Admit: 2015-05-21 | Discharge: 2015-05-21 | Disposition: A | Discharge status: Home / Self Care | Payer: BC Managed Care – PPO | Attending: Emergency Medicine | Admitting: Emergency Medicine

## 2015-05-21 ENCOUNTER — Encounter (HOSPITAL_COMMUNITY): Payer: Self-pay | Admitting: *Deleted

## 2015-05-21 DIAGNOSIS — F319 Bipolar disorder, unspecified: Secondary | ICD-10-CM | POA: Insufficient documentation

## 2015-05-21 DIAGNOSIS — Z79899 Other long term (current) drug therapy: Secondary | ICD-10-CM | POA: Insufficient documentation

## 2015-05-21 DIAGNOSIS — J189 Pneumonia, unspecified organism: Secondary | ICD-10-CM | POA: Diagnosis present

## 2015-05-21 DIAGNOSIS — R4182 Altered mental status, unspecified: Secondary | ICD-10-CM | POA: Diagnosis present

## 2015-05-21 DIAGNOSIS — Z720 Tobacco use: Secondary | ICD-10-CM | POA: Diagnosis not present

## 2015-05-21 DIAGNOSIS — L03114 Cellulitis of left upper limb: Secondary | ICD-10-CM | POA: Diagnosis not present

## 2015-05-21 DIAGNOSIS — E876 Hypokalemia: Secondary | ICD-10-CM | POA: Insufficient documentation

## 2015-05-21 DIAGNOSIS — E669 Obesity, unspecified: Secondary | ICD-10-CM | POA: Diagnosis not present

## 2015-05-21 DIAGNOSIS — Z862 Personal history of diseases of the blood and blood-forming organs and certain disorders involving the immune mechanism: Secondary | ICD-10-CM | POA: Diagnosis not present

## 2015-05-21 DIAGNOSIS — R55 Syncope and collapse: Secondary | ICD-10-CM | POA: Insufficient documentation

## 2015-05-21 LAB — I-STAT CHEM 8, ED
BUN: 14 mg/dL (ref 6–20)
CALCIUM ION: 1.14 mmol/L (ref 1.12–1.23)
CHLORIDE: 102 mmol/L (ref 101–111)
Creatinine, Ser: 0.8 mg/dL (ref 0.61–1.24)
GLUCOSE: 115 mg/dL — AB (ref 65–99)
HCT: 32 % — ABNORMAL LOW (ref 39.0–52.0)
Hemoglobin: 10.9 g/dL — ABNORMAL LOW (ref 13.0–17.0)
Potassium: 3.3 mmol/L — ABNORMAL LOW (ref 3.5–5.1)
Sodium: 139 mmol/L (ref 135–145)
TCO2: 24 mmol/L (ref 0–100)

## 2015-05-21 LAB — COMPREHENSIVE METABOLIC PANEL
ALBUMIN: 3.1 g/dL — AB (ref 3.5–5.0)
ALK PHOS: 67 U/L (ref 38–126)
ALT: 52 U/L (ref 17–63)
AST: 56 U/L — AB (ref 15–41)
Anion gap: 7 (ref 5–15)
BUN: 11 mg/dL (ref 6–20)
CALCIUM: 8.4 mg/dL — AB (ref 8.9–10.3)
CO2: 24 mmol/L (ref 22–32)
CREATININE: 0.76 mg/dL (ref 0.61–1.24)
Chloride: 104 mmol/L (ref 101–111)
GFR calc Af Amer: >60 mL/min (ref >60)
GFR calc non Af Amer: >60 mL/min (ref >60)
GLUCOSE: 117 mg/dL — AB (ref 65–99)
Potassium: 3.2 mmol/L — ABNORMAL LOW (ref 3.5–5.1)
SODIUM: 135 mmol/L (ref 135–145)
Total Bilirubin: 0.8 mg/dL (ref 0.3–1.2)
Total Protein: 5.7 g/dL — ABNORMAL LOW (ref 6.5–8.1)

## 2015-05-21 LAB — PROTIME-INR
INR: 1.27 (ref 0.00–1.49)
PROTHROMBIN TIME: 16.1 s — AB (ref 11.6–15.2)

## 2015-05-21 LAB — CBC
HCT: 30.9 % — ABNORMAL LOW (ref 39.0–52.0)
Hemoglobin: 10.7 g/dL — ABNORMAL LOW (ref 13.0–17.0)
MCH: 33.5 pg (ref 26.0–34.0)
MCHC: 34.6 g/dL (ref 30.0–36.0)
MCV: 96.9 fL (ref 78.0–100.0)
PLATELETS: 216 10*3/uL (ref 150–400)
RBC: 3.19 MIL/uL — AB (ref 4.22–5.81)
RDW: 13.3 % (ref 11.5–15.5)
WBC: 6.8 10*3/uL (ref 4.0–10.5)

## 2015-05-21 LAB — DIFFERENTIAL
Basophils Absolute: 0 10*3/uL (ref 0.0–0.1)
Basophils Relative: 0 % (ref 0–1)
Eosinophils Absolute: 0.2 10*3/uL (ref 0.0–0.7)
Eosinophils Relative: 3 % (ref 0–5)
LYMPHS PCT: 22 % (ref 12–46)
Lymphs Abs: 1.5 10*3/uL (ref 0.7–4.0)
MONO ABS: 0.5 10*3/uL (ref 0.1–1.0)
Monocytes Relative: 8 % (ref 3–12)
NEUTROS ABS: 4.6 10*3/uL (ref 1.7–7.7)
NEUTROS PCT: 67 % (ref 43–77)

## 2015-05-21 LAB — LAMOTRIGINE LEVEL: LAMOTRIGINE LVL: NOT DETECTED ug/mL (ref 2.0–20.0)

## 2015-05-21 LAB — I-STAT TROPONIN, ED: Troponin i, poc: 0.08 ng/mL (ref 0.00–0.08)

## 2015-05-21 LAB — APTT: aPTT: 36 seconds (ref 24–37)

## 2015-05-21 MED ORDER — SULFAMETHOXAZOLE-TRIMETHOPRIM 800-160 MG PO TABS: 1.0000 | ORAL_TABLET | Freq: Two times a day (BID) | ORAL | Status: AC

## 2015-05-21 NOTE — ED Provider Notes (Signed)
CSN: [id number]     Arrival date & time 05/21/15  1024 History   First MD Initiated Contact with Patient 05/21/15 1131     Chief Complaint  Patient presents with  . Stroke Symptoms     (Consider location/radiation/quality/duration/timing/severity/associated sxs/prior Treatment) HPI Comments: Patient presents to the emergency department for evaluation of altered mental status. Patient was hospitalized last week for mental status changes causing a fall. He was in the hospital for several days. Significant other reports that he did well after discharge, all day yesterday he was back to his normal baseline. Overnight, however, patient reportedly became acutely confused. He was found outside, swimming in the pool and the middle of the night. He reports that he was swimming as an alternative to taking a shower. Family reports that his speech has been slurred today and he has been unstable on his feet, similar to when he was admitted last week for the fall. Patient reportedly took the splint off of his broken wrist overnight. He reports that he is still having pain in the wrist, but it is less painful now that the splint is off.   Past Medical History  Diagnosis Date  . OSA (obstructive sleep apnea)   . Allergic rhinitis   . Anemia   . Arthritis   . Atypical chest pain   . Benign prostatic hypertrophy with urinary obstruction   . Bipolar disorder   . Calculus of salivary duct   . Hypokalemia   . Insomnia   . Multiple vitamin deficiency   . Nicotine dependence   . Obesity   . Hard, firm prostate   . Sialoadenitis    Past Surgical History  Procedure Laterality Date  . Appendectomy  1980s  . Biospy prostate    . Knee arthroscopy  1995  . Gastric bypass    . Rhinologic surgery    . Tonsillectomy and adenoidectomy    . Uvuloplasty     Family History  Problem Relation Age of Onset  . Heart attack Maternal Grandfather   . Hyperlipidemia Mother    Social History  Substance Use Topics   . Smoking status: Current Every Day Smoker -- 1.00 packs/day for 29 years    Types: Cigarettes  . Smokeless tobacco: Never Used     Comment: 11/03/11 smoking 15 cigs daily  . Alcohol Use: Yes    Review of Systems  Musculoskeletal: Positive for arthralgias.  Neurological: Positive for speech difficulty.  Psychiatric/Behavioral: Positive for confusion.  All other systems reviewed and are negative.     Allergies  Review of patient's allergies indicates no known allergies.  Home Medications   Prior to Admission medications   Medication Sig Start Date End Date Taking? Authorizing Provider  BELSOMRA 10 MG TABS Take 20 mg by mouth at bedtime. 03/02/15  Yes Historical Provider, MD  clonazePAM (KLONOPIN) 1 MG tablet Take 0.5 mg by mouth as needed.    Yes Historical Provider, MD  ESZOPICLONE 3 MG tablet Take 1 tablet by mouth At bedtime as needed. 08/01/11  Yes Historical Provider, MD  lamoTRIgine (LAMICTAL) 200 MG tablet Take 200 mg by mouth daily.     Yes Historical Provider, MD  Levomilnacipran HCl ER 40 MG CP24 Take 80 mg by mouth daily.   Yes Historical Provider, MD  lurasidone (LATUDA) 40 MG TABS tablet Take 40 mg by mouth.   Yes Historical Provider, MD  Multiple Vitamin (MULTIVITAMIN) capsule Take 1 capsule by mouth daily.     Yes Historical Provider,  MD  temazepam (RESTORIL) 15 MG capsule Take 2 tablets by mouth At bedtime. 09/04/11  Yes Historical Provider, MD  Cholecalciferol (VITAMIN D-3 PO) Take 1 capsule by mouth once a week.     Historical Provider, MD  divalproex (DEPAKOTE) 500 MG DR tablet Take 500 mg by mouth 2 (two) times daily.      Historical Provider, MD  docusate sodium (COLACE) 100 MG capsule Take 100 mg by mouth as needed.      Historical Provider, MD  ferrous gluconate (FERGON) 325 MG tablet Take 325 mg by mouth daily.      Historical Provider, MD  gabapentin (NEURONTIN) 400 MG capsule Take 400 mg by mouth 2 (two) times daily.     Historical Provider, MD  traZODone  (DESYREL) 100 MG tablet Take 2 tablets by mouth At bedtime.  08/17/11   Historical Provider, MD   BP 115/68 mmHg  Pulse 83  Temp(Src) 98 F (36.7 C) (Oral)  Resp 12  SpO2 100% Physical Exam  Constitutional: He is oriented to person, place, and time. He appears well-developed and well-nourished. No distress.  HENT:  Head: Normocephalic. Head is with raccoon's eyes.  Right Ear: Hearing normal.  Left Ear: Hearing normal.  Nose: Nose normal.  Mouth/Throat: Oropharynx is clear and moist and mucous membranes are normal.  Eyes: Conjunctivae and EOM are normal. Pupils are equal, round, and reactive to light.  Neck: Normal range of motion. Neck supple.  Cardiovascular: Regular rhythm, S1 normal and S2 normal.  Exam reveals no gallop and no friction rub.   No murmur heard. Pulmonary/Chest: Effort normal and breath sounds normal. No respiratory distress. He exhibits no tenderness.  Abdominal: Soft. Normal appearance and bowel sounds are normal. There is no hepatosplenomegaly. There is no tenderness. There is no rebound, no guarding, no tenderness at McBurney's point and negative Murphy's sign. No hernia.  Musculoskeletal: Normal range of motion.       Right wrist: He exhibits tenderness and swelling. He exhibits no deformity.  Neurological: He is alert and oriented to person, place, and time. He has normal strength. No cranial nerve deficit or sensory deficit. Coordination normal. GCS eye subscore is 4. GCS verbal subscore is 5. GCS motor subscore is 6.  Skin: Skin is warm, dry and intact. No rash noted. No cyanosis.  Erythema, induration without fluctuance left antecubital fossa  Psychiatric: He has a normal mood and affect. His speech is normal and behavior is normal. Thought content normal.  Nursing note and vitals reviewed.   ED Course  Procedures (including critical care time) Labs Review Labs Reviewed  PROTIME-INR - Abnormal; Notable for the following:    Prothrombin Time 16.1 (*)     All other components within normal limits  CBC - Abnormal; Notable for the following:    RBC 3.19 (*)    Hemoglobin 10.7 (*)    HCT 30.9 (*)    All other components within normal limits  COMPREHENSIVE METABOLIC PANEL - Abnormal; Notable for the following:    Potassium 3.2 (*)    Glucose, Bld 117 (*)    Calcium 8.4 (*)    Total Protein 5.7 (*)    Albumin 3.1 (*)    AST 56 (*)    All other components within normal limits  I-STAT CHEM 8, ED - Abnormal; Notable for the following:    Potassium 3.3 (*)    Glucose, Bld 115 (*)    Hemoglobin 10.9 (*)    HCT 32.0 (*)  All other components within normal limits  APTT  DIFFERENTIAL  I-STAT TROPOININ, ED  CBG MONITORING, ED    Imaging Review Ct Head Wo Contrast  05/21/2015   CLINICAL DATA:  Unsteady gait and slurred speech on Thursday and had a fall, was seen on Thursday for concussion and fractures, bruising noted to face. Bilateral eye sockets are purple and bruised, also forehead is bruised. Stroke symptoms.  EXAM: CT HEAD WITHOUT CONTRAST  TECHNIQUE: Contiguous axial images were obtained from the base of the skull through the vertex without intravenous contrast.  COMPARISON:  04/22/2015 ; under different hospital registration: MRI brain from 05/19/2015 and CT head from 05/17/2015  FINDINGS: Chronically stable faint calcification of the globus pallidus nucleus on the left.  Tiny punctate focal hyperdensity along the left frontal lobe gray-white junction on images 18-19 of series 201 and a separate 4 mm focus of hyper density on image 22 of series 201 in the left frontal lobe, compatible with small cortical contusions or shear injuries. These are new compared to 04/22/2015 but stable from 05/17/2015.  Otherwise, The brainstem, cerebellum, cerebral peduncles, thalamus, basal ganglia, basilar cisterns, and ventricular system appear within normal limits. No mass lesion, brain herniation, or acute CVA identified.  Left forehead scalp hematoma  observed. There is a nondisplaced fracture the left frontal bone which extends down through the superior orbital rim and along the medial wall of the orbit underlying the forehead hematoma. No large orbital hematoma or significant intraconal injury identified. The fracture appears stable from 05/17/2015.  Chronic paranasal sinusitis involving the ethmoid air cells, maxillary sinuses, and sphenoid sinuses.  IMPRESSION: 1. There are 2 small hemorrhagic cortical contusions or less likely foci shear injury in the left frontal lobe, both small but hyperdense, with the larger lesion measuring 3 mm in diameter. These are not significantly changed from 05/17/2015. 2. There is a large left frontal scalp hematoma with an underlying nondisplaced fracture of the left frontal bone extending to the superior orbital rim, the left orbital roof, and into the medial wall with the left orbit. 3. Chronic paranasal sinusitis involving the maxillary, ethmoid, and sphenoid sinuses. 4. Faint density in the left globus pallidus nucleus is similar to prior exams and likely represents physiologic calcification. Critical Value/emergent results were called by telephone at the time of interpretation on 05/21/2015 at 12:32 pm to Dr. Jaci Carrel , who verbally acknowledged these results. We discussed the situation with the patient being registered under a different medical record numbers and were careful to review imaging findings under all of the patient's medical records. The patient is scheduled for a record merge.   Electronically Signed   By: Gaylyn Rong M.D.   On: 05/21/2015 12:38   I have personally reviewed and evaluated these images and lab results as part of my medical decision-making.   EKG Interpretation   Date/Time:  Monday May 21 2015 11:01:19 EDT Ventricular Rate:  71 PR Interval:  150 QRS Duration: 110 QT Interval:  426 QTC Calculation: 462 R Axis:   138 Text Interpretation:  Normal sinus rhythm  Right axis deviation Incomplete  right bundle branch block Abnormal ECG No significant change since last  tracing Confirmed by Lansing Sigmon  MD, Aramis Weil ([id number]) on 05/21/2015  11:35:53 AM      MDM   Final diagnoses:  Altered mental status, unspecified altered mental status type  Cellulitis of left upper extremity    Patient presents to the ER for evaluation of mental status changes. Patient was recently  hospitalized after a fall. Patient was diagnosed with skull fracture and frontal contusion. Patient had been exhibiting change in mental status, confusion, memory loss prior to the fall. It was felt that the fall was caused by this. No obvious reason was identified during the hospitalization. Patient was ruled out for seizure. Significant other reports that he was doing well yesterday, about his normal baseline. Overnight, however, he became acutely confused.  His workup today has been unremarkable. CT scan is unchanged from his MRI performed several days ago. No worsening of bleeding. Discussed briefly with neurology, did not recommend further neurologic evaluation. Symptoms might be psychiatric in nature. Patient admits to depression but is not experiencing suicidal ideation. He declined psychiatric evaluation here. He reports that he will talk to his psychiatrist to see if it is related to his medications.  Patient refusing to maintain the splint on his right distal radius fracture. He took the splint off himself at home. He will not allow replacement of an upper class splint. Patient will be placed in a cockup wrist splint.  Patient also has an area of induration and erythema in the left antecubital fossa. No fluctuance or drainable abscess. Will treat with Bactrim.    Gilda Crease, MD 05/21/15 [phone]

## 2015-05-21 NOTE — Discharge Instructions (Signed)

## 2015-05-21 NOTE — ED Notes (Signed)
Per family, pt had unsteady gait and slurred speech on Thursday and had a fall, was seen on Thursday for concussion and fractures, bruising noted to face. Family reports pt was at baseline Saturday, last night was becoming altered, got up in middle of night and went swimming, reports slow speech today and unsteady gait. Also reports right wrist pain he wants evaluated since his fall.

## 2015-05-21 NOTE — ED Notes (Signed)
Patient transported to CT 

## 2015-05-22 ENCOUNTER — Ambulatory Visit (INDEPENDENT_AMBULATORY_CARE_PROVIDER_SITE_OTHER): Payer: BC Managed Care – PPO

## 2015-05-22 DIAGNOSIS — R9431 Abnormal electrocardiogram [ECG] [EKG]: Secondary | ICD-10-CM

## 2015-05-22 DIAGNOSIS — I4581 Long QT syndrome: Secondary | ICD-10-CM | POA: Diagnosis not present

## 2015-05-22 DIAGNOSIS — R55 Syncope and collapse: Secondary | ICD-10-CM | POA: Diagnosis not present

## 2015-05-22 DIAGNOSIS — E875 Hyperkalemia: Secondary | ICD-10-CM

## 2015-05-28 ENCOUNTER — Encounter (HOSPITAL_COMMUNITY): Payer: Self-pay | Admitting: *Deleted

## 2015-05-29 ENCOUNTER — Encounter (HOSPITAL_COMMUNITY): Payer: Self-pay | Admitting: *Deleted

## 2015-05-29 MED ORDER — CEFAZOLIN SODIUM-DEXTROSE 2-3 GM-% IV SOLR
2.0000 g | INTRAVENOUS | Status: AC
  Administered 2015-05-30: 2 g via INTRAVENOUS
  Filled 2015-05-29: qty 50

## 2015-05-29 NOTE — Progress Notes (Signed)
Anesthesia Chart Review: SAME DAY WORK-UP.  Patient is a 54 year old male scheduled for ORIF right wrist fracture on 05/30/15 by Dr. Melvyn Novas. Case needs listed as GA with axillary block.   On 05/17/15, he was found unresponsive on the floor at that bottom of the stairs with surrounding blood. GCS was 10 on scene and he was combative. UDS + for THC. ETOH < 5. Troponin 1.5. QTc was prolonged on EKG. CK 4600, lactic acid 1.7. Other injuries includes traumatic facial fractures, right comminuted distal radial fracture. He was admitted by trauma for rhabdomyolysis. CCM, neurology (Dr. Elspeth Cho), psychiatry, maxillofacial surgery, ortho, and cardiology consulted. Other history includes smoking, OSA without CPAP, anemia, Bipolar disorder, known prior single seizure, insomnia, BPH, anemia, atypical chest pain. No PCP is listed.   Patient was seen by cardiologist Dr. Johney Frame during his hospitalization. Per his last note on 05/19/15:  1. Elevated troponin  Likely due to rhabdomyolysis I do not believe that he has had an acute coronary syndrome Would recommend elective outpatient stress testing once he is clinically improved No further cardiology workup planned 2. Prolonged QT Likely due to psychotropic meds/ electrolyte abnormality Keep K >3.9, Mg >1.9 Would reduce/ minimize psychotropic meds as able. Will defer to psychiatry 3. ? LOC The etiology to is fall/ probable syncope is unclear and likely multifactoral Echo is normal and telemetry is without arrhythmia while here Avoid qt prolonging meds as above 30 day monitor as outpatient No driving x 6 months (I have instructed patient)  Meds include Belsomra, Klonopin, Depakote, Eszopiclone, Fergon, Lamictal, Fetzima, Latuda, Restoril, trazodone.  05/21/15 EKG: NSR, RAD, incomplete right BBB. QTc 462 ms (down from 533 ms on 05/17/15 and 484 ms on 05/18/15).   05/2015 30 day Event monitor: Placed 05/22/15, pending results.  05/19/15 Echo: Study Conclusions:  Left ventricle: The cavity size was normal. Wall thickness was increased in a pattern of mild LVH. Systolic function was normal. The estimated ejection fraction was in the range of 50% to 55%. Probable mild hypokinesis of the mid-apical anteroseptal myocardium. Left ventricular diastolic function parameters werenormal.  05/18/15 EEG: A normal EEG does not exclude a clinical diagnosis of epilepsy. Clinical correlation is advised.  05/19/15 Brain MRI/MRA: IMPRESSION: MRI HEAD IMPRESSION: 1. Traumatic brain injury with a few small scattered parenchymal contusions within the left frontal lobe and right temporal lobe as detailed above. No significant mass effect. 2. Large left frontotemporal scalp contusion. 3. No other acute intracranial process. No infarct. 4. Paranasal sinus disease as above, most severe within the left maxillary sinus. MRA HEAD IMPRESSION: Normal MRA of the intracranial circulation.  05/17/15 CT head/c-spine/maxillofacial: IMPRESSION: - Large soft tissue hematoma over the left frontal bone. Nondisplaced fracture inferior left frontal bone extending to the superior anterior orbital rim. No intracranial mass, hemorrhage, or extra-axial fluid collection. Gray-white compartments appear normal. -  CT maxillofacial: Nondisplaced fractures of the anterior superior left orbital rim as well as the posterior left maxillary sinus wall. No other fractures are apparent. No dislocation. Extensive paranasal sinus disease. Obstruction of both ostiomeatal unit complexes, presumably due to blood. Soft tissue swelling noted over both upper facial and preseptal orbital regions. Swelling greater on the left than on the right. More superiorly, there is a hematoma over the left frontal bone. - CT cervical spine: Areas of osteoarthritic change. Calcification in the posterior longitudinal ligament at C4 and C5. No acute fracture or spondylolisthesis. Question edema versus contusion in the lung apices  bilaterally.  Neurology notes  mention doing a carotid duplex, but I don't see that one was ever ordered.   Labs from 05/17/15-05/21/15 noted. Most recently K 3.3, Cr 0.80, Mg 1.8, CK elevated but trending down at 1944, troponin i poc 0.08 (peak 2.57 on 05/18/15). H/H 10.9/32.0. PT 16.1, INR 1.27, PTT 36. A1C 5.2.   Discussed above with anesthesiologist Dr. Noreene Larsson including cardiology input. Elevated troponin felt likely related to rhabdo, although Dr. Johney Frame felt patient may benefit from a future stress test once he is fully recovered. Currently, I don't see a discharge summary yet for his recent hospitalization. I don't think there is a known definitive etiology for his recent fall, intermittent mental status changes (slurred speech and gait instability) that has been occurring since at least June 2016. He has had a fairly extensive work-up. Holter monitor and carotid duplex are still pending.  Further evaluation by his assigned anesthesiologist on the day of surgery, but if no acute CV or neurological issues then can likely proceed as planned.   Velna [facility] Stay [facility]/Anesthesiology Phone [phone] 05/29/2015 12:55 PM

## 2015-05-30 ENCOUNTER — Ambulatory Visit (HOSPITAL_COMMUNITY)
Admission: RE | Admit: 2015-05-30 | Discharge: 2015-05-31 | Disposition: A | Discharge status: Home / Self Care | Payer: BC Managed Care – PPO | Source: Ambulatory Visit | Attending: Orthopedic Surgery | Admitting: Orthopedic Surgery

## 2015-05-30 ENCOUNTER — Encounter (HOSPITAL_COMMUNITY)
Admission: RE | Disposition: A | Discharge status: Home / Self Care | Payer: Self-pay | Source: Ambulatory Visit | Attending: Orthopedic Surgery

## 2015-05-30 ENCOUNTER — Ambulatory Visit (HOSPITAL_COMMUNITY): Payer: BC Managed Care – PPO | Admitting: Vascular Surgery

## 2015-05-30 ENCOUNTER — Encounter (HOSPITAL_COMMUNITY): Payer: Self-pay | Admitting: Certified Registered Nurse Anesthetist

## 2015-05-30 DIAGNOSIS — S52501A Unspecified fracture of the lower end of right radius, initial encounter for closed fracture: Secondary | ICD-10-CM | POA: Diagnosis present

## 2015-05-30 DIAGNOSIS — F319 Bipolar disorder, unspecified: Secondary | ICD-10-CM | POA: Diagnosis not present

## 2015-05-30 DIAGNOSIS — W19XXXA Unspecified fall, initial encounter: Secondary | ICD-10-CM | POA: Diagnosis not present

## 2015-05-30 DIAGNOSIS — S52571A Other intraarticular fracture of lower end of right radius, initial encounter for closed fracture: Secondary | ICD-10-CM | POA: Diagnosis present

## 2015-05-30 DIAGNOSIS — F419 Anxiety disorder, unspecified: Secondary | ICD-10-CM | POA: Insufficient documentation

## 2015-05-30 DIAGNOSIS — F17210 Nicotine dependence, cigarettes, uncomplicated: Secondary | ICD-10-CM | POA: Insufficient documentation

## 2015-05-30 HISTORY — DX: Pneumonia, unspecified organism: J18.9

## 2015-05-30 HISTORY — DX: Abnormal electrocardiogram (ECG) (EKG): R94.31

## 2015-05-30 HISTORY — DX: Unspecified fracture of skull, initial encounter for closed fracture: S02.91XA

## 2015-05-30 HISTORY — DX: Anxiety disorder, unspecified: F41.9

## 2015-05-30 HISTORY — DX: Major depressive disorder, single episode, unspecified: F32.9

## 2015-05-30 HISTORY — DX: Personal history of (healed) traumatic fracture: Z87.81

## 2015-05-30 HISTORY — PX: ORIF WRIST FRACTURE: SHX2133

## 2015-05-30 HISTORY — DX: Rhabdomyolysis: M62.82

## 2015-05-30 HISTORY — DX: Depression, unspecified: F32.A

## 2015-05-30 SURGERY — OPEN REDUCTION INTERNAL FIXATION (ORIF) WRIST FRACTURE
Anesthesia: Regional | Site: Wrist | Laterality: Right

## 2015-05-30 MED ORDER — 0.9 % SODIUM CHLORIDE (POUR BTL) OPTIME
TOPICAL | Status: DC | PRN
  Administered 2015-05-30: 1000 mL

## 2015-05-30 MED ORDER — LEVOMILNACIPRAN HCL ER 80 MG PO CP24: 80.0000 mg | ORAL_CAPSULE | Freq: Every day | ORAL | Status: DC

## 2015-05-30 MED ORDER — MIDAZOLAM HCL 2 MG/2ML IJ SOLN
INTRAMUSCULAR | Status: AC
  Filled 2015-05-30: qty 2

## 2015-05-30 MED ORDER — DOCUSATE SODIUM 100 MG PO CAPS
100.0000 mg | ORAL_CAPSULE | Freq: Two times a day (BID) | ORAL | Status: DC
  Administered 2015-05-30 – 2015-05-31 (×2): 100 mg via ORAL
  Filled 2015-05-30: qty 1

## 2015-05-30 MED ORDER — LURASIDONE HCL 40 MG PO TABS
40.0000 mg | ORAL_TABLET | Freq: Every day | ORAL | Status: DC
  Administered 2015-05-30 – 2015-05-31 (×2): 40 mg via ORAL
  Filled 2015-05-30 (×3): qty 1

## 2015-05-30 MED ORDER — FENTANYL CITRATE (PF) 100 MCG/2ML IJ SOLN
INTRAMUSCULAR | Status: AC
  Filled 2015-05-30: qty 2

## 2015-05-30 MED ORDER — LIDOCAINE HCL (CARDIAC) 20 MG/ML IV SOLN
INTRAVENOUS | Status: AC
  Filled 2015-05-30: qty 5

## 2015-05-30 MED ORDER — FENTANYL CITRATE (PF) 250 MCG/5ML IJ SOLN
INTRAMUSCULAR | Status: AC
  Filled 2015-05-30: qty 5

## 2015-05-30 MED ORDER — HYDROMORPHONE HCL 1 MG/ML IJ SOLN: 0.2500 mg | INTRAMUSCULAR | Status: DC | PRN

## 2015-05-30 MED ORDER — DOCUSATE SODIUM 100 MG PO CAPS: 100.0000 mg | ORAL_CAPSULE | Freq: Two times a day (BID) | ORAL | Status: DC

## 2015-05-30 MED ORDER — MULTIVITAMINS PO CAPS: 1.0000 | ORAL_CAPSULE | Freq: Every day | ORAL | Status: DC

## 2015-05-30 MED ORDER — ARTIFICIAL TEARS OP OINT
TOPICAL_OINTMENT | OPHTHALMIC | Status: AC
  Filled 2015-05-30: qty 3.5

## 2015-05-30 MED ORDER — METHOCARBAMOL 500 MG PO TABS
500.0000 mg | ORAL_TABLET | Freq: Four times a day (QID) | ORAL | Status: DC
Start: 2015-05-30 — End: 2017-03-06

## 2015-05-30 MED ORDER — ADULT MULTIVITAMIN W/MINERALS CH: 1.0000 | ORAL_TABLET | Freq: Every day | ORAL | Status: DC

## 2015-05-30 MED ORDER — KCL IN DEXTROSE-NACL 20-5-0.45 MEQ/L-%-% IV SOLN: INTRAVENOUS | Status: DC

## 2015-05-30 MED ORDER — LACTATED RINGERS IV SOLN
INTRAVENOUS | Status: DC
  Administered 2015-05-30 (×2): via INTRAVENOUS

## 2015-05-30 MED ORDER — CHLORHEXIDINE GLUCONATE 4 % EX LIQD: 60.0000 mL | Freq: Once | CUTANEOUS | Status: DC

## 2015-05-30 MED ORDER — DIPHENHYDRAMINE HCL 25 MG PO CAPS: 25.0000 mg | ORAL_CAPSULE | Freq: Four times a day (QID) | ORAL | Status: DC | PRN

## 2015-05-30 MED ORDER — HYDROMORPHONE HCL 1 MG/ML IJ SOLN
INTRAMUSCULAR | Status: AC
  Filled 2015-05-30: qty 1

## 2015-05-30 MED ORDER — ROPIVACAINE HCL 5 MG/ML IJ SOLN
INTRAMUSCULAR | Status: DC | PRN
  Administered 2015-05-30: 30 mL via PERINEURAL

## 2015-05-30 MED ORDER — FENTANYL CITRATE (PF) 250 MCG/5ML IJ SOLN
INTRAMUSCULAR | Status: DC | PRN
  Administered 2015-05-30: 50 ug via INTRAVENOUS

## 2015-05-30 MED ORDER — ONDANSETRON HCL 4 MG PO TABS: 4.0000 mg | ORAL_TABLET | Freq: Four times a day (QID) | ORAL | Status: DC | PRN

## 2015-05-30 MED ORDER — PROPOFOL 10 MG/ML IV BOLUS
INTRAVENOUS | Status: AC
  Filled 2015-05-30: qty 20

## 2015-05-30 MED ORDER — ADULT MULTIVITAMIN W/MINERALS CH
1.0000 | ORAL_TABLET | Freq: Every day | ORAL | Status: DC
  Administered 2015-05-31: 1 via ORAL
  Filled 2015-05-30: qty 1

## 2015-05-30 MED ORDER — PROPOFOL INFUSION 10 MG/ML OPTIME
INTRAVENOUS | Status: DC | PRN
  Administered 2015-05-30: 100 ug/kg/min via INTRAVENOUS

## 2015-05-30 MED ORDER — CEFAZOLIN SODIUM 1-5 GM-% IV SOLN
1.0000 g | Freq: Three times a day (TID) | INTRAVENOUS | Status: AC
  Administered 2015-05-30 – 2015-05-31 (×3): 1 g via INTRAVENOUS
  Filled 2015-05-30 (×3): qty 50

## 2015-05-30 MED ORDER — VITAMIN C 500 MG PO TABS
1000.0000 mg | ORAL_TABLET | Freq: Every day | ORAL | Status: DC
  Administered 2015-05-31: 1000 mg via ORAL
  Filled 2015-05-30: qty 2

## 2015-05-30 MED ORDER — TEMAZEPAM 15 MG PO CAPS: 15.0000 mg | ORAL_CAPSULE | Freq: Once | ORAL | Status: DC

## 2015-05-30 MED ORDER — VITAMIN C 500 MG PO TABS: 500.0000 mg | ORAL_TABLET | Freq: Every day | ORAL | Status: DC

## 2015-05-30 MED ORDER — OXYCODONE-ACETAMINOPHEN 5-325 MG PO TABS
1.0000 | ORAL_TABLET | ORAL | Status: DC | PRN
  Administered 2015-05-31 (×2): 2 via ORAL
  Filled 2015-05-30 (×2): qty 2

## 2015-05-30 MED ORDER — BUPIVACAINE HCL (PF) 0.25 % IJ SOLN
INTRAMUSCULAR | Status: AC
  Filled 2015-05-30: qty 30

## 2015-05-30 MED ORDER — ONDANSETRON HCL 4 MG/2ML IJ SOLN: 4.0000 mg | Freq: Four times a day (QID) | INTRAMUSCULAR | Status: DC | PRN

## 2015-05-30 MED ORDER — ONDANSETRON HCL 4 MG/2ML IJ SOLN
INTRAMUSCULAR | Status: DC | PRN
  Administered 2015-05-30: 4 mg via INTRAVENOUS

## 2015-05-30 MED ORDER — ROCURONIUM BROMIDE 50 MG/5ML IV SOLN
INTRAVENOUS | Status: AC
  Filled 2015-05-30: qty 1

## 2015-05-30 MED ORDER — PROPOFOL 10 MG/ML IV BOLUS
INTRAVENOUS | Status: DC | PRN
  Administered 2015-05-30: 150 mg via INTRAVENOUS

## 2015-05-30 MED ORDER — METHOCARBAMOL 1000 MG/10ML IJ SOLN
500.0000 mg | Freq: Four times a day (QID) | INTRAVENOUS | Status: DC | PRN
  Filled 2015-05-30: qty 5

## 2015-05-30 MED ORDER — HYDROMORPHONE HCL 1 MG/ML IJ SOLN
0.5000 mg | INTRAMUSCULAR | Status: DC | PRN
  Administered 2015-05-30 – 2015-05-31 (×3): 1 mg via INTRAVENOUS
  Filled 2015-05-30 (×3): qty 1

## 2015-05-30 MED ORDER — FENTANYL CITRATE (PF) 100 MCG/2ML IJ SOLN
50.0000 ug | Freq: Once | INTRAMUSCULAR | Status: AC
  Administered 2015-05-30: 50 ug via INTRAVENOUS

## 2015-05-30 MED ORDER — SUVOREXANT 10 MG PO TABS: 20.0000 mg | ORAL_TABLET | Freq: Every day | ORAL | Status: DC

## 2015-05-30 MED ORDER — ZOLPIDEM TARTRATE 5 MG PO TABS: 5.0000 mg | ORAL_TABLET | Freq: Every evening | ORAL | Status: DC | PRN

## 2015-05-30 MED ORDER — HYDROCODONE-ACETAMINOPHEN 5-325 MG PO TABS
1.0000 | ORAL_TABLET | ORAL | Status: DC | PRN
  Administered 2015-05-31: 2 via ORAL
  Filled 2015-05-30 (×2): qty 2

## 2015-05-30 MED ORDER — CLONAZEPAM 0.5 MG PO TABS: 0.5000 mg | ORAL_TABLET | Freq: Two times a day (BID) | ORAL | Status: DC | PRN

## 2015-05-30 MED ORDER — LAMOTRIGINE 200 MG PO TABS
200.0000 mg | ORAL_TABLET | Freq: Every day | ORAL | Status: DC
  Administered 2015-05-30 – 2015-05-31 (×2): 200 mg via ORAL
  Filled 2015-05-30: qty 1
  Filled 2015-05-30: qty 2
  Filled 2015-05-30: qty 1

## 2015-05-30 MED ORDER — ONDANSETRON HCL 4 MG/2ML IJ SOLN
INTRAMUSCULAR | Status: AC
  Filled 2015-05-30: qty 2

## 2015-05-30 MED ORDER — METHOCARBAMOL 500 MG PO TABS
500.0000 mg | ORAL_TABLET | Freq: Four times a day (QID) | ORAL | Status: DC | PRN
  Administered 2015-05-31: 500 mg via ORAL
  Filled 2015-05-30: qty 1

## 2015-05-30 SURGICAL SUPPLY — 67 items
BANDAGE ELASTIC 3 VELCRO ST LF (GAUZE/BANDAGES/DRESSINGS) ×3 IMPLANT
BANDAGE ELASTIC 4 VELCRO ST LF (GAUZE/BANDAGES/DRESSINGS) ×3 IMPLANT
BIT DRILL 2.2 SS TIBIAL (BIT) ×3 IMPLANT
BLADE SURG ROTATE 9660 (MISCELLANEOUS) ×3 IMPLANT
BNDG ESMARK 4X9 LF (GAUZE/BANDAGES/DRESSINGS) ×3 IMPLANT
BNDG GAUZE ELAST 4 BULKY (GAUZE/BANDAGES/DRESSINGS) ×3 IMPLANT
CORDS BIPOLAR (ELECTRODE) ×3 IMPLANT
COVER SURGICAL LIGHT HANDLE (MISCELLANEOUS) ×3 IMPLANT
CUFF TOURNIQUET SINGLE 18IN (TOURNIQUET CUFF) ×3 IMPLANT
CUFF TOURNIQUET SINGLE 24IN (TOURNIQUET CUFF) IMPLANT
DRAIN TLS ROUND 10FR (DRAIN) IMPLANT
DRAPE OEC MINIVIEW 54X84 (DRAPES) ×3 IMPLANT
DRAPE SURG 17X11 SM STRL (DRAPES) ×3 IMPLANT
DRSG ADAPTIC 3X8 NADH LF (GAUZE/BANDAGES/DRESSINGS) ×3 IMPLANT
ELECT REM PT RETURN 9FT ADLT (ELECTROSURGICAL) ×3
ELECTRODE REM PT RTRN 9FT ADLT (ELECTROSURGICAL) ×1 IMPLANT
GAUZE SPONGE 4X4 12PLY STRL (GAUZE/BANDAGES/DRESSINGS) ×3 IMPLANT
GLOVE BIOGEL PI IND STRL 6.5 (GLOVE) ×1 IMPLANT
GLOVE BIOGEL PI IND STRL 8.5 (GLOVE) ×1 IMPLANT
GLOVE BIOGEL PI INDICATOR 6.5 (GLOVE) ×2
GLOVE BIOGEL PI INDICATOR 8.5 (GLOVE) ×2
GLOVE SURG ORTHO 8.0 STRL STRW (GLOVE) ×3 IMPLANT
GLOVE SURG SS PI 6.5 STRL IVOR (GLOVE) ×3 IMPLANT
GOWN STRL REUS W/ TWL LRG LVL3 (GOWN DISPOSABLE) ×1 IMPLANT
GOWN STRL REUS W/ TWL XL LVL3 (GOWN DISPOSABLE) ×2 IMPLANT
GOWN STRL REUS W/TWL LRG LVL3 (GOWN DISPOSABLE) ×2
GOWN STRL REUS W/TWL XL LVL3 (GOWN DISPOSABLE) ×4
K-WIRE 1.6 (WIRE) ×2
K-WIRE FX5X1.6XNS BN SS (WIRE) ×1
KIT BASIN OR (CUSTOM PROCEDURE TRAY) ×3 IMPLANT
KIT ROOM TURNOVER OR (KITS) ×3 IMPLANT
KWIRE FX5X1.6XNS BN SS (WIRE) ×1 IMPLANT
MANIFOLD NEPTUNE II (INSTRUMENTS) ×3 IMPLANT
NEEDLE HYPO 25X1 1.5 SAFETY (NEEDLE) ×3 IMPLANT
NS IRRIG 1000ML POUR BTL (IV SOLUTION) ×3 IMPLANT
PACK ORTHO EXTREMITY (CUSTOM PROCEDURE TRAY) ×3 IMPLANT
PAD ARMBOARD 7.5X6 YLW CONV (MISCELLANEOUS) ×6 IMPLANT
PAD CAST 3X4 CTTN HI CHSV (CAST SUPPLIES) ×1 IMPLANT
PAD CAST 4YDX4 CTTN HI CHSV (CAST SUPPLIES) ×1 IMPLANT
PADDING CAST COTTON 3X4 STRL (CAST SUPPLIES) ×2
PADDING CAST COTTON 4X4 STRL (CAST SUPPLIES) ×2
PEG LOCKING SMOOTH 2.2X16 (Screw) ×3 IMPLANT
PEG LOCKING SMOOTH 2.2X18 (Peg) ×3 IMPLANT
PEG LOCKING SMOOTH 2.2X20 (Screw) ×6 IMPLANT
PEG LOCKING SMOOTH 2.2X22 (Screw) ×6 IMPLANT
PEG LOCKING SMOOTH 2.2X24 (Peg) ×3 IMPLANT
PLATE WIDE DVR RIGHT (Plate) ×3 IMPLANT
SCREW LOCK 16X2.7X 3 LD TPR (Screw) ×5 IMPLANT
SCREW LOCK 18X2.7X 3 LD TPR (Screw) ×1 IMPLANT
SCREW LOCK 26X2.7X 3 LD TPR (Screw) ×2 IMPLANT
SCREW LOCKING 2.7X15MM (Screw) ×3 IMPLANT
SCREW LOCKING 2.7X16 (Screw) ×10 IMPLANT
SCREW LOCKING 2.7X18 (Screw) ×2 IMPLANT
SCREW LOCKING 2.7X26MM (Screw) ×4 IMPLANT
SCREW NONLOCK 2.7X28MM (Screw) ×3 IMPLANT
SOAP 2 % CHG 4 OZ (WOUND CARE) ×3 IMPLANT
SPLINT FIBERGLASS 3X35 (CAST SUPPLIES) ×3 IMPLANT
SUT PROLENE 4 0 PS 2 18 (SUTURE) IMPLANT
SUT VIC AB 2-0 FS1 27 (SUTURE) ×3 IMPLANT
SUT VIC AB 4-0 PS2 27 (SUTURE) ×3 IMPLANT
SUT VICRYL 4-0 PS2 18IN ABS (SUTURE) ×3 IMPLANT
SUT VICRYL RAPIDE 4/0 PS 2 (SUTURE) ×6 IMPLANT
SYR CONTROL 10ML LL (SYRINGE) IMPLANT
SYSTEM CHEST DRAIN TLS 7FR (DRAIN) IMPLANT
TOWEL OR 17X26 10 PK STRL BLUE (TOWEL DISPOSABLE) ×3 IMPLANT
TUBE CONNECTING 12'X1/4 (SUCTIONS) ×1
TUBE CONNECTING 12X1/4 (SUCTIONS) ×2 IMPLANT

## 2015-05-30 NOTE — Anesthesia Preprocedure Evaluation (Signed)
Anesthesia Evaluation  Patient identified by MRN, date of birth, ID band Patient awake    Reviewed: Allergy & Precautions, H&P , NPO status , Patient's Chart, lab work & pertinent test results  Airway        Dental no notable dental hx.    Pulmonary sleep apnea , Current Smoker,    Pulmonary exam normal        Cardiovascular negative cardio ROS       Neuro/Psych Anxiety Depression Bipolar Disorder negative neurological ROS     GI/Hepatic negative GI ROS, Neg liver ROS,   Endo/Other  negative endocrine ROS  Renal/GU negative Renal ROS  negative genitourinary   Musculoskeletal  (+) Arthritis ,   Abdominal   Peds  Hematology negative hematology ROS (+)   Anesthesia Other Findings   Reproductive/Obstetrics negative OB ROS                             Anesthesia Physical Anesthesia Plan  ASA: III  Anesthesia Plan: General and Regional   Post-op Pain Management: GA combined w/ Regional for post-op pain   Induction: Intravenous  Airway Management Planned: LMA  Additional Equipment:   Intra-op Plan:   Post-operative Plan: Extubation in OR  Informed Consent: I have reviewed the patients History and Physical, chart, labs and discussed the procedure including the risks, benefits and alternatives for the proposed anesthesia with the patient or authorized representative who has indicated his/her understanding and acceptance.   Dental advisory given  Plan Discussed with: CRNA  Anesthesia Plan Comments:         Anesthesia Quick Evaluation

## 2015-05-30 NOTE — Discharge Instructions (Signed)
KEEP BANDAGE CLEAN AND DRY °CALL OFFICE FOR F/U APPT [phone] in 14 days °KEEP HAND ELEVATED ABOVE HEART °OK TO APPLY ICE TO OPERATIVE AREA °CONTACT OFFICE IF ANY WORSENING PAIN OR CONCERNS. °

## 2015-05-30 NOTE — Transfer of Care (Signed)
Immediate Anesthesia Transfer of Care Note  Patient: Alvin Allen  Procedure(s) Performed: Procedure(s): OPEN REDUCTION INTERNAL FIXATION (ORIF) RIGHT WRIST FRACTURE (Right)  Patient Location: PACU  Anesthesia Type:General  Level of Consciousness: awake, patient cooperative and lethargic  Airway & Oxygen Therapy: Patient Spontanous Breathing and Patient connected to nasal cannula oxygen  Post-op Assessment: Report given to RN and Post -op Vital signs reviewed and stable  Post vital signs: Reviewed and stable  Last Vitals:  116/78 HR 58 RR 12 SpO2 100% on 2L [place]  Complications: No apparent anesthesia complications

## 2015-05-30 NOTE — Progress Notes (Signed)
Patient states that he has been taking 2 antibiotics but unsure of the name.  Patient's family states that he took them yesterday, 05/31/15

## 2015-05-30 NOTE — Progress Notes (Signed)
Called pt for pre-op call. Pt's speech very slurred, states I woke him up from a nap. 20 minutes into the call, speech still slurred, drifting out of conversation and then all of sudden start talking again, confused about medications - told me he was on Depakote then when I instructed him to take it in the AM he stated he wasn't taking it. I gave him pre-op instructions, he states he will remember them. I did try to call his wife to give her instructions but no answer and voicemail was unavailable.

## 2015-05-30 NOTE — Anesthesia Procedure Notes (Addendum)
Anesthesia Regional Block:  Supraclavicular block  Pre-Anesthetic Checklist: ,, timeout performed, Correct Patient, Correct Site, Correct Laterality, Correct Procedure, Correct Position, site marked, Risks and benefits discussed, pre-op evaluation, post-op pain management  Laterality: Right  Prep: Maximum Sterile Barrier Precautions used and chloraprep       Needles:  Injection technique: Single-shot  Needle Type: Echogenic Stimulator Needle     Needle Length: 5cm 5 cm Needle Gauge: 22 and 22 G    Additional Needles:  Procedures: ultrasound guided (picture in chart) Supraclavicular block Narrative:  Start time: 05/30/2015 1:05 PM End time: 05/30/2015 1:15 PM Injection made incrementally with aspirations every 5 mL. Anesthesiologist: Gaynelle Adu  Additional Notes: 2% Lidocaine skin wheel.    Procedure Name: LMA Insertion Date/Time: 05/30/2015 1:35 PM Performed by: Roney Mans P Pre-anesthesia Checklist: Patient identified, Timeout performed, Emergency Drugs available, Suction available and Patient being monitored Patient Re-evaluated:Patient Re-evaluated prior to inductionOxygen Delivery Method: Circle system utilized Preoxygenation: Pre-oxygenation with 100% oxygen Intubation Type: IV induction Ventilation: Mask ventilation without difficulty LMA: LMA inserted LMA Size: 5.0 Number of attempts: 1 Placement Confirmation: breath sounds checked- equal and bilateral and positive ETCO2 Tube secured with: Tape Dental Injury: Teeth and Oropharynx as per pre-operative assessment

## 2015-05-30 NOTE — Progress Notes (Signed)
Orthopedic Tech Progress Note Patient Details:  Alvin Allen 04/19/1961 [id number] Applied arm elevator sling to RUE.  Ortho Devices Type of Ortho Device: Arm sling Ortho Device/Splint Location: RUE Ortho Device/Splint Interventions: Application   Lesle Chris 05/30/2015, 7:15 PM

## 2015-05-30 NOTE — H&P (Signed)
Alvin Allen is an 54 y.o. male.   Chief Complaint: right wrist injury HPI: Pt fell injuring right wrist Pt here for surgery Pt with displaced right distal radius fracture No prior surgery to right wrist  Past Medical History  Diagnosis Date  . Allergic rhinitis   . Anemia   . Arthritis   . Atypical chest pain   . Benign prostatic hypertrophy with urinary obstruction   . Bipolar disorder   . Calculus of salivary duct   . Hypokalemia   . Insomnia   . Multiple vitamin deficiency   . Nicotine dependence   . Obesity   . Hard, firm prostate   . Sialoadenitis   . H/O: facial fractures 05/18/15  . Skull fracture with contusion 05/18/15  . Rhabdomyolysis 05/18/15    after fall  . Prolonged QT interval   . OSA (obstructive sleep apnea)     does not use cpap  . Pneumonia   . Anxiety   . Depression     Past Surgical History  Procedure Laterality Date  . Gastric bypass  2006  . Appendectomy  1980s  . Biospy prostate    . Knee arthroscopy  1995  . Gastric bypass    . Rhinologic surgery    . Tonsillectomy and adenoidectomy    . Uvuloplasty    . Colonoscopy      Family History  Problem Relation Age of Onset  . Heart attack Maternal Grandfather   . Hyperlipidemia Mother   . Macular degeneration Father    Social History:  reports that he has been smoking E-cigarettes and Cigarettes.  He has been smoking about 0.00 packs per day for the past 29 years. He has never used smokeless tobacco. He reports that he drinks alcohol. He reports that he does not use illicit drugs.  Allergies: No Known Allergies  Medications Prior to Admission  Medication Sig Dispense Refill  . BELSOMRA 10 MG TABS Take 20 mg by mouth at bedtime.  3  . clonazePAM (KLONOPIN) 0.5 MG tablet Take 0.5 mg by mouth 2 (two) times daily as needed for anxiety.    Marland Kitchen ESZOPICLONE 3 MG tablet Take 1 tablet by mouth At bedtime as needed.    . lamoTRIgine (LAMICTAL) 100 MG tablet Take 200 mg by mouth daily.    .  Levomilnacipran HCl ER (FETZIMA) 80 MG CP24 Take 80 mg by mouth daily.    Marland Kitchen lurasidone (LATUDA) 40 MG TABS tablet Take 40 mg by mouth.    . Multiple Vitamin (MULTIVITAMIN) capsule Take 1 capsule by mouth daily.      . temazepam (RESTORIL) 15 MG capsule Take 2 tablets by mouth At bedtime.      No results found for this or any previous visit (from the past 48 hour(s)). No results found.  ROSNO RECENT ILLNESSES OR HOSPITALIZATIONS  Blood pressure 107/67, pulse 65, temperature 97.7 F (36.5 C), temperature source Oral, resp. rate 18, height  (1.778 m), weight 83.008 kg (183 lb), SpO2 100 %. Physical Exam  General Appearance:  Alert, cooperative, no distress, appears stated age  Head:  Normocephalic, without obvious abnormality, atraumatic  Eyes:  Pupils equal, conjunctiva/corneas clear,         Throat: Lips, mucosa, and tongue normal; teeth and gums normal  Neck: No visible masses     Lungs:   respirations unlabored  Chest Wall:  No tenderness or deformity  Heart:  Regular rate and rhythm,  Abdomen:   Soft, non-tender,  Extremities: RIGHT WRIST: SKIN INTACT WIGGLES FINGERS FINGERS WARM WELL PERFUSED ABLE TO EXTEND THUMB  Pulses: 2+ and symmetric  Skin: Skin color, texture, turgor normal, no rashes or lesions     Neurologic: Normal    Assessment/Plan RIGHT WRIST INTRA-ARTICULAR DISTAL RADIUS FRACTURE, DISPLACED  RIGHT WRIST OPEN REDUCTION AND INTERNAL FIXATION AND REPAIR AS INDICATED  R/B/A DISCUSSED WITH PT IN OFFICE.  PT VOICED UNDERSTANDING OF PLAN CONSENT SIGNED DAY OF SURGERY PT SEEN AND EXAMINED PRIOR TO OPERATIVE PROCEDURE/DAY OF SURGERY SITE MARKED. QUESTIONS ANSWERED WILL BE ADMITTED OBSERVATION FOLLOWING SURGERY  WE ARE PLANNING SURGERY FOR YOUR UPPER EXTREMITY. THE RISKS AND BENEFITS OF SURGERY INCLUDE BUT NOT LIMITED TO BLEEDING INFECTION, DAMAGE TO NEARBY NERVES ARTERIES TENDONS, FAILURE OF SURGERY TO ACCOMPLISH ITS INTENDED GOALS, PERSISTENT  SYMPTOMS AND NEED FOR FURTHER SURGICAL INTERVENTION. WITH THIS IN MIND WE WILL PROCEED. I HAVE DISCUSSED WITH THE PATIENT THE PRE AND POSTOPERATIVE REGIMEN AND THE DOS AND DON'TS. PT VOICED UNDERSTANDING AND INFORMED CONSENT SIGNED.  Alvin Allen 05/30/2015, 1:06 PM

## 2015-05-30 NOTE — Brief Op Note (Signed)
05/30/2015  1:09 PM  PATIENT:  Alvin Allen  54 y.o. male  PRE-OPERATIVE DIAGNOSIS:  right writ intra-articular distal radius fracture  POST-OPERATIVE DIAGNOSIS:  * No post-op diagnosis entered *  PROCEDURE:  Procedure(s): OPEN REDUCTION INTERNAL FIXATION (ORIF) RIGHT WRIST FRACTURE (Right)  SURGEON:  Surgeon(s) and Role:    * Bradly Bienenstock, MD - Primary  PHYSICIAN ASSISTANT:   ASSISTANTS: none   ANESTHESIA:   general  EBL:     BLOOD ADMINISTERED:none  DRAINS: none   LOCAL MEDICATIONS USED:  NONE  SPECIMEN:  No Specimen  DISPOSITION OF SPECIMEN:  N/A  COUNTS:  YES  TOURNIQUET:    DICTATION: .Other Dictation: Dictation Number [id number]  PLAN OF CARE: Admit for overnight observation  PATIENT DISPOSITION:  PACU - hemodynamically stable.   Delay start of Pharmacological VTE agent (>24hrs) due to surgical blood loss or risk of bleeding: no

## 2015-05-31 ENCOUNTER — Encounter (HOSPITAL_COMMUNITY): Payer: Self-pay | Admitting: Orthopedic Surgery

## 2015-05-31 DIAGNOSIS — S52571A Other intraarticular fracture of lower end of right radius, initial encounter for closed fracture: Secondary | ICD-10-CM | POA: Diagnosis not present

## 2015-05-31 NOTE — Op Note (Signed)
NAME:  Alvin Allen, Alvin Allen NO.:  [id number]  MEDICAL RECORD NO.:  [id number]  LOCATION:  [id number]                        FACILITY:  [facility]  PHYSICIAN:  [name] IV, M.D.DATE OF BIRTH:  1960/09/28  DATE OF PROCEDURE:  05/31/2015 DATE OF DISCHARGE:  05/31/2015                              OPERATIVE REPORT   PREOPERATIVE DIAGNOSIS:  Right wrist intra-articular distal radius fracture, 3 or more fragments.  POSTOPERATIVE DIAGNOSIS:  Right wrist intra-articular distal radius fracture, 3 or more fragments.  ATTENDING PHYSICIAN:  [name] IV, MD, who scrubbed for the entire procedure.  ASSISTANT SURGEON:  None.  ANESTHESIA:  General via LMA with axillary block.  PROCEDURE: 1. Open reduction and internal fixation of displaced intra-articular     distal radius fracture, 3 or more fragments. 2. Right wrist brachioradialis tendon release and lengthening. 3. Right wrist radiograph, 3-views of right wrist.  RADIOGRAPHIC INTERPRETATION:  AP, lateral, and oblique films of the wrist did show the volar plate fixation in place.  There was good position in both planes.  SURGICAL IMPLANTS:  DVR Crosslock wide plate with a total of 8 screws distally and 6 bicortical screws proximally.  SURGICAL INDICATIONS:  Alvin Allen is a right-hand-dominant gentleman who sustained a closed injury to his right wrist.  The patient was seen and evaluated in the office and recommended to undergo the above procedure.  Risks, benefits, and alternatives were discussed in detail with the patient.  Signed informed consent was obtained.  Risks include, but not limited to bleeding, infection, damage to nearby nerves, arteries, or tendons, loss of motion to wrist and digits, incomplete relief of symptoms, and need for further surgical intervention.  DESCRIPTION OF PROCEDURE:  The patient was properly identified in the preoperative holding area, marked with permanent marker around  right wrist to indicate the correct operative site.  The patient was then brought back to the operating room, placed supine on anesthesia table and general anesthesia was administered.  The patient tolerated this well.  A well-padded tourniquet placed on the right brachium and sealed with 1000 drape.  The right upper extremity was then prepped and draped in a sterile fashion.  Time-out was called.  Correct site was identified, and procedure then begun.  Attention then turned to the right wrist.  Limb was then elevated and tourniquet inflated. Preoperative antibiotics were given.  Dissection was then carried down through the skin and subcutaneous tissue.  The FCR sheath was then opened.  Going through the floor of the FCR sheath, the FPL was then carefully swept out of the away and L-shaped pronator quadratus flap was then elevated and then the fracture site was then exposed.  The patient did have the intra-articular fracture of 3 or more fragments.  Takedown of the early fracture callus was then carried out.  In order to reduce the radial column, the brachioradialis was then carefully released to lengthen out the radial styloid and this allowed for open reduction.  An open reduction was then performed.  The volar plate was then applied. Given the size the radius, a wide plate was then used.  This was then held in place distally with a K-wire.  The  position confirmed using mini C-arm.  The oblong screw hole was then used proximally for the shaft screw and the plate height was then adjusted.  Following this, ulnar column and the radial column fixation was then carried out with the appropriate drilling and depth gauge measurement.  Combination of locking screws and locking pegs were then used distally.  Attention was then turned back to the shaft for combination of locking shaft screws and nonlocking shaft screws were then used.  The wound was then thoroughly irrigated.  Thorough wound  irrigation Allen throughout.  The pronator quadratus was then closed with Vicryl, subcutaneous tissues closed with Vicryl, skin was then closed with 4-0 Vicryl Rapide. Adaptic dressing, sterile compressive bandage then applied.  The patient was placed in a well-padded sugar-tong splint, extubated, and taken to recovery room in good condition.  POSTPROCEDURE PLAN:  The patient admitted overnight for IV antibiotics and pain control, discharged in the morning, seen back in the office in approximately 2 weeks for wound check, suture removal, x-rays, application of short-arm cast, radiographs at each visit and therapy regimen at the 4 week mark.     Alvin Allen, M.D.     FWO/MEDQ  D:  05/31/2015  T:  05/31/2015  Job:  [id number]

## 2015-05-31 NOTE — Anesthesia Postprocedure Evaluation (Signed)
  Anesthesia Post-op Note  Patient: Alvin Allen  Procedure(s) Performed: Procedure(s): OPEN REDUCTION INTERNAL FIXATION (ORIF) RIGHT WRIST FRACTURE (Right)  Patient Location: PACU  Anesthesia Type: General, Regional   Level of Consciousness: awake, alert  and oriented  Airway and Oxygen Therapy: Patient Spontanous Breathing  Post-op Pain: mild  Post-op Assessment: Post-op Vital signs reviewed  Post-op Vital Signs: Reviewed  Last Vitals:  Filed Vitals:   05/31/15 0556  BP: 117/69  Pulse: 67  Temp: 37.1 C  Resp: 20    Complications: No apparent anesthesia complications

## 2015-05-31 NOTE — Discharge Summary (Signed)
Physician Discharge Summary  Patient ID: Alvin Allen MRN: [id number] DOB/AGE: [birth year] 54 y.o.  Admit date: 05/30/2015 Discharge date: 05/31/2015  Admission Diagnoses: right wrist intra-articular distal radius fracture Past Medical History  Diagnosis Date  . Allergic rhinitis   . Anemia   . Arthritis   . Atypical chest pain   . Benign prostatic hypertrophy with urinary obstruction   . Bipolar disorder   . Calculus of salivary duct   . Hypokalemia   . Insomnia   . Multiple vitamin deficiency   . Nicotine dependence   . Obesity   . Hard, firm prostate   . Sialoadenitis   . H/O: facial fractures 05/18/15  . Skull fracture with contusion 05/18/15  . Rhabdomyolysis 05/18/15    after fall  . Prolonged QT interval   . OSA (obstructive sleep apnea)     does not use cpap  . Pneumonia   . Anxiety   . Depression     Discharge Diagnoses:  Active Problems:   Fracture of right distal radius   Surgeries: Procedure(s): OPEN REDUCTION INTERNAL FIXATION (ORIF) RIGHT WRIST FRACTURE on 05/30/2015    Consultants:    Discharged Condition: Improved  Hospital Course: Saliou Barnier is an 54 y.o. male who was admitted 05/30/2015 with a chief complaint of No chief complaint on file. , and found to have a diagnosis of right wrist intra-articular distal radius fracture.  They were brought to the operating room on 05/30/2015 and underwent Procedure(s): OPEN REDUCTION INTERNAL FIXATION (ORIF) RIGHT WRIST FRACTURE.    They were given perioperative antibiotics: Anti-infectives    Start     Dose/Rate Route Frequency Ordered Stop   05/30/15 2000  ceFAZolin (ANCEF) IVPB 1 g/50 mL premix     1 g 100 mL/hr over 30 Minutes Intravenous Every 8 hours 05/30/15 1312 05/31/15 1959   05/30/15 0600  ceFAZolin (ANCEF) IVPB 2 g/50 mL premix     2 g 100 mL/hr over 30 Minutes Intravenous On call to O.R. 05/29/15 1242 05/30/15 1336    .  They were given sequential compression devices, early ambulation, and  Other (comment) for DVT prophylaxis.  Recent vital signs: Patient Vitals for the past 24 hrs:  BP Temp Temp src Pulse Resp SpO2 Height Weight  05/31/15 0556 117/69 mmHg 98.7 F (37.1 C) Oral 67 20 98 % - -  05/30/15 2110 109/71 mmHg 97.5 F (36.4 C) Oral (!) 58 18 100 % - -  05/30/15 1829 111/66 mmHg 97.8 F (36.6 C) Oral (!) 55 14 100 % - -  05/30/15 1800 138/64 mmHg - - 78 12 100 % - -  05/30/15 1700 - - - 80 - - - -  05/30/15 1645 133/62 mmHg - - 80 10 100 % - -  05/30/15 1630 - - - 79 (!) 21 100 % - -  05/30/15 1615 118/78 mmHg 97.9 F (36.6 C) - (!) 55 12 100 % - -  05/30/15 1320 105/60 mmHg - - 60 12 100 % - -  05/30/15 1315 103/62 mmHg - - 63 12 100 % - -  05/30/15 1310 110/62 mmHg - - 62 (!) 22 100 % - -  05/30/15 1305 109/65 mmHg - - 69 20 100 % - -  05/30/15 1300 105/67 mmHg - - (!) 58 (!) 21 100 % - -  05/30/15 1107 107/67 mmHg 97.7 F (36.5 C) Oral 65 18 100 % 5\' 10"  (1.778 m) 83.008 kg (183 lb)  .  Recent laboratory studies:  No results found.  Discharge Medications:     Medication List    TAKE these medications        BELSOMRA 10 MG Tabs  Generic drug:  Suvorexant  Take 20 mg by mouth at bedtime.     clonazePAM 0.5 MG tablet  Commonly known as:  KLONOPIN  Take 0.5 mg by mouth 2 (two) times daily as needed for anxiety.     docusate sodium 100 MG capsule  Commonly known as:  COLACE  Take 1 capsule (100 mg total) by mouth 2 (two) times daily.     eszopiclone 3 MG Tabs  Generic drug:  Eszopiclone  Take 1 tablet by mouth At bedtime as needed.     FETZIMA 80 MG Cp24  Generic drug:  Levomilnacipran HCl ER  Take 80 mg by mouth daily.     lamoTRIgine 100 MG tablet  Commonly known as:  LAMICTAL  Take 200 mg by mouth daily.     lurasidone 40 MG Tabs tablet  Commonly known as:  LATUDA  Take 40 mg by mouth.     methocarbamol 500 MG tablet  Commonly known as:  ROBAXIN  Take 1 tablet (500 mg total) by mouth 4 (four) times daily.     multivitamin capsule   Take 1 capsule by mouth daily.     temazepam 15 MG capsule  Commonly known as:  RESTORIL  Take 2 tablets by mouth At bedtime.     vitamin C 500 MG tablet  Commonly known as:  ASCORBIC ACID  Take 1 tablet (500 mg total) by mouth daily.        Diagnostic Studies: Dg Wrist Complete Right  05/18/2015   CLINICAL DATA:  Right wrist pain.  Recent fall.  EXAM: RIGHT WRIST - COMPLETE 3+ VIEW  COMPARISON:  None.  FINDINGS: IV tubing overlies the wrist, obscuring detail. There is a transverse and comminuted fracture the distal radius position with mild displacement mild dorsal angulation. Radiocarpal narrowing is noted. Intercarpal spaces are normal. Distal ulna intact.  IMPRESSION: Comminuted distal radial fracture.   Electronically Signed   By: Norva Pavlov M.D.   On: 05/18/2015 10:47   Ct Head Wo Contrast  05/21/2015   CLINICAL DATA:  Unsteady gait and slurred speech on Thursday and had a fall, was seen on Thursday for concussion and fractures, bruising noted to face. Bilateral eye sockets are purple and bruised, also forehead is bruised. Stroke symptoms.  EXAM: CT HEAD WITHOUT CONTRAST  TECHNIQUE: Contiguous axial images were obtained from the base of the skull through the vertex without intravenous contrast.  COMPARISON:  04/22/2015 ; under different hospital registration: MRI brain from 05/19/2015 and CT head from 05/17/2015  FINDINGS: Chronically stable faint calcification of the globus pallidus nucleus on the left.  Tiny punctate focal hyperdensity along the left frontal lobe gray-white junction on images 18-19 of series 201 and a separate 4 mm focus of hyper density on image 22 of series 201 in the left frontal lobe, compatible with small cortical contusions or shear injuries. These are new compared to 04/22/2015 but stable from 05/17/2015.  Otherwise, The brainstem, cerebellum, cerebral peduncles, thalamus, basal ganglia, basilar cisterns, and ventricular system appear within normal limits. No  mass lesion, brain herniation, or acute CVA identified.  Left forehead scalp hematoma observed. There is a nondisplaced fracture the left frontal bone which extends down through the superior orbital rim and along the medial wall of the orbit underlying the forehead hematoma. No large orbital  hematoma or significant intraconal injury identified. The fracture appears stable from 05/17/2015.  Chronic paranasal sinusitis involving the ethmoid air cells, maxillary sinuses, and sphenoid sinuses.  IMPRESSION: 1. There are 2 small hemorrhagic cortical contusions or less likely foci shear injury in the left frontal lobe, both small but hyperdense, with the larger lesion measuring 3 mm in diameter. These are not significantly changed from 05/17/2015. 2. There is a large left frontal scalp hematoma with an underlying nondisplaced fracture of the left frontal bone extending to the superior orbital rim, the left orbital roof, and into the medial wall with the left orbit. 3. Chronic paranasal sinusitis involving the maxillary, ethmoid, and sphenoid sinuses. 4. Faint density in the left globus pallidus nucleus is similar to prior exams and likely represents physiologic calcification. Critical Value/emergent results were called by telephone at the time of interpretation on 05/21/2015 at 12:32 pm to Dr. Jaci Carrel , who verbally acknowledged these results. We discussed the situation with the patient being registered under a different medical record numbers and were careful to review imaging findings under all of the patient's medical records. The patient is scheduled for a record merge.   Electronically Signed   By: Gaylyn Rong M.D.   On: 05/21/2015 12:38   Ct Head Wo Contrast  05/17/2015   CLINICAL DATA:  Patient fell down stairs  EXAM: CT HEAD WITHOUT CONTRAST  CT MAXILLOFACIAL WITHOUT CONTRAST  CT CERVICAL SPINE WITHOUT CONTRAST  TECHNIQUE: Multidetector CT imaging of the head, cervical spine, and maxillofacial  structures were performed using the standard protocol without intravenous contrast. Multiplanar CT image reconstructions of the cervical spine and maxillofacial structures were also generated.  COMPARISON:  None.  FINDINGS: CT HEAD FINDINGS  The ventricles are normal in size and configuration. There is no intracranial mass hemorrhage, extra-axial fluid collection, or midline shift. The gray-white compartments appear normal. No acute infarct is evident. There is a large hematoma overlying the left frontal bone. There is a nondisplaced fracture of the anterior superior left orbital rim extending to the inferior left frontal bone with air along the superior most aspect of the orbit. This air is confined to the intraorbital region. The bony calvarium otherwise appears intact. The mastoid air cells are clear.  CT MAXILLOFACIAL FINDINGS  There is marked soft tissue swelling over the left frontal bone with acute hematoma. There is a fracture of the anterior superior left orbital rim extending into the inferior left frontal bone focal air in the superior posterior aspect of the left orbit. There is a fracture of the medial posterior left maxillary wall. No other fractures are apparent. There is no dislocation. There is no intraorbital lesion beyond the air in the superior posterior left orbit. The globes appear intact as do the extraocular muscles and optic nerve regions.  There is soft tissue swelling over the upper face and preseptal orbital regions bilaterally.  There is extensive opacification of the left frontal sinus with air-fluid level. There is opacification of multiple ethmoid air cells bilaterally, more on the left than on the right. There is diffuse opacification of much of the left maxillary antrum with an air-fluid level. There is hemorrhage in the upper nares regions bilaterally. There is ostiomeatal unit obstruction bilaterally, presumably due to hemorrhage. There is moderate mucosal thickening in the right  maxillary antrum. There is a small air-fluid level in the right sphenoid sinus.  The visualized pharyngeal air column is normal. Salivary glands appear normal. No adenopathy.  CT CERVICAL SPINE FINDINGS  There is no fracture or spondylolisthesis. Prevertebral soft tissues and predental space regions are normal. There is moderately severe disc space narrowing at C6-7 and C7-T1. There is milder narrowing at C4-5 and C5-6. There is mild narrowing at C3-4. There is no nerve root edema or effacement. There is calcification in the posterior longitudinal ligament at C4 and C5. No demonstrable disc extrusion or high-grade stenosis.  There is alveolar opacity in each lung apex, consistent with either edema or possibly contusion.  IMPRESSION: Large soft tissue hematoma over the left frontal bone. Nondisplaced fracture inferior left frontal bone extending to the superior anterior orbital rim. No intracranial mass, hemorrhage, or extra-axial fluid collection. Gray-white compartments appear normal.  CT maxillofacial: Nondisplaced fractures of the anterior superior left orbital rim as well as the posterior left maxillary sinus wall. No other fractures are apparent. No dislocation. Extensive paranasal sinus disease. Obstruction of both ostiomeatal unit complexes, presumably due to blood. Soft tissue swelling noted over both upper facial and preseptal orbital regions. Swelling greater on the left than on the right. More superiorly, there is a hematoma over the left frontal bone.  CT cervical spine: Areas of osteoarthritic change. Calcification in the posterior longitudinal ligament at C4 and C5. No acute fracture or spondylolisthesis. Question edema versus contusion in the lung apices bilaterally.   Electronically Signed   By: Bretta Bang III M.D.   On: 05/17/2015 19:58   Ct Cervical Spine Wo Contrast  05/17/2015   CLINICAL DATA:  Patient fell down stairs  EXAM: CT HEAD WITHOUT CONTRAST  CT MAXILLOFACIAL WITHOUT CONTRAST   CT CERVICAL SPINE WITHOUT CONTRAST  TECHNIQUE: Multidetector CT imaging of the head, cervical spine, and maxillofacial structures were performed using the standard protocol without intravenous contrast. Multiplanar CT image reconstructions of the cervical spine and maxillofacial structures were also generated.  COMPARISON:  None.  FINDINGS: CT HEAD FINDINGS  The ventricles are normal in size and configuration. There is no intracranial mass hemorrhage, extra-axial fluid collection, or midline shift. The gray-white compartments appear normal. No acute infarct is evident. There is a large hematoma overlying the left frontal bone. There is a nondisplaced fracture of the anterior superior left orbital rim extending to the inferior left frontal bone with air along the superior most aspect of the orbit. This air is confined to the intraorbital region. The bony calvarium otherwise appears intact. The mastoid air cells are clear.  CT MAXILLOFACIAL FINDINGS  There is marked soft tissue swelling over the left frontal bone with acute hematoma. There is a fracture of the anterior superior left orbital rim extending into the inferior left frontal bone focal air in the superior posterior aspect of the left orbit. There is a fracture of the medial posterior left maxillary wall. No other fractures are apparent. There is no dislocation. There is no intraorbital lesion beyond the air in the superior posterior left orbit. The globes appear intact as do the extraocular muscles and optic nerve regions.  There is soft tissue swelling over the upper face and preseptal orbital regions bilaterally.  There is extensive opacification of the left frontal sinus with air-fluid level. There is opacification of multiple ethmoid air cells bilaterally, more on the left than on the right. There is diffuse opacification of much of the left maxillary antrum with an air-fluid level. There is hemorrhage in the upper nares regions bilaterally. There is  ostiomeatal unit obstruction bilaterally, presumably due to hemorrhage. There is moderate mucosal thickening in the right maxillary antrum. There is  a small air-fluid level in the right sphenoid sinus.  The visualized pharyngeal air column is normal. Salivary glands appear normal. No adenopathy.  CT CERVICAL SPINE FINDINGS  There is no fracture or spondylolisthesis. Prevertebral soft tissues and predental space regions are normal. There is moderately severe disc space narrowing at C6-7 and C7-T1. There is milder narrowing at C4-5 and C5-6. There is mild narrowing at C3-4. There is no nerve root edema or effacement. There is calcification in the posterior longitudinal ligament at C4 and C5. No demonstrable disc extrusion or high-grade stenosis.  There is alveolar opacity in each lung apex, consistent with either edema or possibly contusion.  IMPRESSION: Large soft tissue hematoma over the left frontal bone. Nondisplaced fracture inferior left frontal bone extending to the superior anterior orbital rim. No intracranial mass, hemorrhage, or extra-axial fluid collection. Gray-white compartments appear normal.  CT maxillofacial: Nondisplaced fractures of the anterior superior left orbital rim as well as the posterior left maxillary sinus wall. No other fractures are apparent. No dislocation. Extensive paranasal sinus disease. Obstruction of both ostiomeatal unit complexes, presumably due to blood. Soft tissue swelling noted over both upper facial and preseptal orbital regions. Swelling greater on the left than on the right. More superiorly, there is a hematoma over the left frontal bone.  CT cervical spine: Areas of osteoarthritic change. Calcification in the posterior longitudinal ligament at C4 and C5. No acute fracture or spondylolisthesis. Question edema versus contusion in the lung apices bilaterally.   Electronically Signed   By: Bretta Bang III M.D.   On: 05/17/2015 19:58   Mr Maxine Glenn Head Wo  Contrast  05/19/2015   EXAM: MRI HEAD WITHOUT CONTRAST  MRA HEAD WITHOUT CONTRAST  TECHNIQUE: Multiplanar, multiecho pulse sequences of the brain and surrounding structures were obtained without intravenous contrast. Angiographic images of the head were obtained using MRA technique without contrast.  COMPARISON:  Prior CT from 05/17/2015.  FINDINGS: MRI HEAD FINDINGS  Prominent left frontal scalp contusion with adjacent soft tissue swelling present. Previously identified nondisplaced fracture through the right orbital roof not well visualized. Scalp soft tissues otherwise unremarkable.  There is a few scattered subcentimeter foci of hypo intense signal intensity seen on gradient echo sequence within the subcortical white matter of the anterior left frontal lobe (series 7, image 20). Associated hyperintense FLAIR/T2 signal intensity. Findings most consistent with tiny small acute parenchymal hemorrhages/contusions. Upon review of the prior CT, small focus of hyperdense blood seen within this region. There is minimal susceptibility artifact on DWI sequence about the most prominent of these foci (series 12, image 27). Additionally, there is a small focus of restricted diffusion within the anterior inferior left frontal lobe, best appreciated on coronal DWI sequence (series 12, image 30). Finding most likely reflects a small focus of traumatic injury/contusion. Additionally, there is a small cortical contusion within the peripheral right temporal lobe (series 6, image 12), consistent with a contrecoup injury. Additional small foci of hemorrhage seen on gradient echo sequence within the cortical gray matter slightly posteriorly within the posterior right temporal lobe (series 7, image 13).  No acute intracranial infarct. Gray-white matter differentiation otherwise maintained. Normal intravascular flow voids preserved.  Cerebral volume within normal limits for patient age. Minimal chronic small vessel ischemic type  changes present within the periventricular white matter.  No mass lesion or midline shift. No mass effect. No hydrocephalus. No extra-axial fluid collection.  Craniocervical junction normal. Pituitary gland within normal limits.  Globes within normal limits. Increased AP diameter  about the globes and cells consistent with axial myalgia.  Moderate opacity within the left maxillary sinus. Scattered mucosal thickening within the right maxillary sinus and ethmoidal air cells, as well as the left frontal sinus and sphenoid sinuses. Mild scattered opacity within the mastoid air cells bilaterally, slightly greater on the left. Inner ear structures grossly normal.  Bone marrow signal intensity within normal limits.  MRA HEAD FINDINGS  ANTERIOR CIRCULATION:  Visualized distal cervical segments of the internal carotid arteries are patent with antegrade flow. The petrous, cavernous, and supraclinoid segments are widely patent. A1 segments, anterior communicating artery, and anterior cerebral arteries widely patent. M1 segments well opacified without stenosis or occlusion. MCA bifurcations normal. Distal MCA branches well opacified bilaterally.  POSTERIOR CIRCULATION:  Right vertebral artery dominant and widely patent to the vertebrobasilar junction. Diminutive left vertebral artery terminates in PICA. Basilar artery mildly irregular, likely related to motion artifact. Basilar artery itself patent without stenosis or occlusion. Superior cerebellar arteries patent bilaterally. P1 segments are slightly hypoplastic bilaterally with prominent posterior communicating arteries. Posterior cerebral arteries well opacified to their distal aspects.  No aneurysm or vascular malformation.  IMPRESSION: MRI HEAD IMPRESSION:  1. Traumatic brain injury with a few small scattered parenchymal contusions within the left frontal lobe and right temporal lobe as detailed above. No significant mass effect. 2. Large left frontotemporal scalp  contusion. 3. No other acute intracranial process.  No infarct. 4. Paranasal sinus disease as above, most severe within the left maxillary sinus.  MRA HEAD IMPRESSION:  Normal MRA of the intracranial circulation.   Electronically Signed   By: Rise Mu M.D.   On: 05/19/2015 02:55   Mr Brain Wo Contrast  05/19/2015   EXAM: MRI HEAD WITHOUT CONTRAST  MRA HEAD WITHOUT CONTRAST  TECHNIQUE: Multiplanar, multiecho pulse sequences of the brain and surrounding structures were obtained without intravenous contrast. Angiographic images of the head were obtained using MRA technique without contrast.  COMPARISON:  Prior CT from 05/17/2015.  FINDINGS: MRI HEAD FINDINGS  Prominent left frontal scalp contusion with adjacent soft tissue swelling present. Previously identified nondisplaced fracture through the right orbital roof not well visualized. Scalp soft tissues otherwise unremarkable.  There is a few scattered subcentimeter foci of hypo intense signal intensity seen on gradient echo sequence within the subcortical white matter of the anterior left frontal lobe (series 7, image 20). Associated hyperintense FLAIR/T2 signal intensity. Findings most consistent with tiny small acute parenchymal hemorrhages/contusions. Upon review of the prior CT, small focus of hyperdense blood seen within this region. There is minimal susceptibility artifact on DWI sequence about the most prominent of these foci (series 12, image 27). Additionally, there is a small focus of restricted diffusion within the anterior inferior left frontal lobe, best appreciated on coronal DWI sequence (series 12, image 30). Finding most likely reflects a small focus of traumatic injury/contusion. Additionally, there is a small cortical contusion within the peripheral right temporal lobe (series 6, image 12), consistent with a contrecoup injury. Additional small foci of hemorrhage seen on gradient echo sequence within the cortical gray matter slightly  posteriorly within the posterior right temporal lobe (series 7, image 13).  No acute intracranial infarct. Gray-white matter differentiation otherwise maintained. Normal intravascular flow voids preserved.  Cerebral volume within normal limits for patient age. Minimal chronic small vessel ischemic type changes present within the periventricular white matter.  No mass lesion or midline shift. No mass effect. No hydrocephalus. No extra-axial fluid collection.  Craniocervical junction normal. Pituitary gland  within normal limits.  Globes within normal limits. Increased AP diameter about the globes and cells consistent with axial myalgia.  Moderate opacity within the left maxillary sinus. Scattered mucosal thickening within the right maxillary sinus and ethmoidal air cells, as well as the left frontal sinus and sphenoid sinuses. Mild scattered opacity within the mastoid air cells bilaterally, slightly greater on the left. Inner ear structures grossly normal.  Bone marrow signal intensity within normal limits.  MRA HEAD FINDINGS  ANTERIOR CIRCULATION:  Visualized distal cervical segments of the internal carotid arteries are patent with antegrade flow. The petrous, cavernous, and supraclinoid segments are widely patent. A1 segments, anterior communicating artery, and anterior cerebral arteries widely patent. M1 segments well opacified without stenosis or occlusion. MCA bifurcations normal. Distal MCA branches well opacified bilaterally.  POSTERIOR CIRCULATION:  Right vertebral artery dominant and widely patent to the vertebrobasilar junction. Diminutive left vertebral artery terminates in PICA. Basilar artery mildly irregular, likely related to motion artifact. Basilar artery itself patent without stenosis or occlusion. Superior cerebellar arteries patent bilaterally. P1 segments are slightly hypoplastic bilaterally with prominent posterior communicating arteries. Posterior cerebral arteries well opacified to their distal  aspects.  No aneurysm or vascular malformation.  IMPRESSION: MRI HEAD IMPRESSION:  1. Traumatic brain injury with a few small scattered parenchymal contusions within the left frontal lobe and right temporal lobe as detailed above. No significant mass effect. 2. Large left frontotemporal scalp contusion. 3. No other acute intracranial process.  No infarct. 4. Paranasal sinus disease as above, most severe within the left maxillary sinus.  MRA HEAD IMPRESSION:  Normal MRA of the intracranial circulation.   Electronically Signed   By: Rise Mu M.D.   On: 05/19/2015 02:55   Dg Pelvis Portable  05/17/2015   CLINICAL DATA:  Trauma.  Patient fell today.  EXAM: PORTABLE PELVIS 1-2 VIEWS  COMPARISON:  None.  FINDINGS: The right iliac crest is excluded. No evidence for acute fracture or subluxation. Regional bowel gas pattern is nonobstructed. Surgical clips are noted in the right upper quadrant.  IMPRESSION: 1.  No evidence for acute  abnormality. 2. Right iliac crest excluded.   Electronically Signed   By: Norva Pavlov M.D.   On: 05/17/2015 18:43   Dg Chest Portable 1 View  05/17/2015   CLINICAL DATA:  Pain following fall  EXAM: PORTABLE CHEST - 1 VIEW  COMPARISON:  None.  FINDINGS: There is no edema or consolidation. Heart size and pulmonary vascularity are normal. No adenopathy. No bone lesions.  IMPRESSION: No edema or consolidation.  No pneumothorax.   Electronically Signed   By: Bretta Bang III M.D.   On: 05/17/2015 18:42   Ct Maxillofacial Wo Cm  05/17/2015   CLINICAL DATA:  Patient fell down stairs  EXAM: CT HEAD WITHOUT CONTRAST  CT MAXILLOFACIAL WITHOUT CONTRAST  CT CERVICAL SPINE WITHOUT CONTRAST  TECHNIQUE: Multidetector CT imaging of the head, cervical spine, and maxillofacial structures were performed using the standard protocol without intravenous contrast. Multiplanar CT image reconstructions of the cervical spine and maxillofacial structures were also generated.  COMPARISON:  None.   FINDINGS: CT HEAD FINDINGS  The ventricles are normal in size and configuration. There is no intracranial mass hemorrhage, extra-axial fluid collection, or midline shift. The gray-white compartments appear normal. No acute infarct is evident. There is a large hematoma overlying the left frontal bone. There is a nondisplaced fracture of the anterior superior left orbital rim extending to the inferior left frontal bone with air along  the superior most aspect of the orbit. This air is confined to the intraorbital region. The bony calvarium otherwise appears intact. The mastoid air cells are clear.  CT MAXILLOFACIAL FINDINGS  There is marked soft tissue swelling over the left frontal bone with acute hematoma. There is a fracture of the anterior superior left orbital rim extending into the inferior left frontal bone focal air in the superior posterior aspect of the left orbit. There is a fracture of the medial posterior left maxillary wall. No other fractures are apparent. There is no dislocation. There is no intraorbital lesion beyond the air in the superior posterior left orbit. The globes appear intact as do the extraocular muscles and optic nerve regions.  There is soft tissue swelling over the upper face and preseptal orbital regions bilaterally.  There is extensive opacification of the left frontal sinus with air-fluid level. There is opacification of multiple ethmoid air cells bilaterally, more on the left than on the right. There is diffuse opacification of much of the left maxillary antrum with an air-fluid level. There is hemorrhage in the upper nares regions bilaterally. There is ostiomeatal unit obstruction bilaterally, presumably due to hemorrhage. There is moderate mucosal thickening in the right maxillary antrum. There is a small air-fluid level in the right sphenoid sinus.  The visualized pharyngeal air column is normal. Salivary glands appear normal. No adenopathy.  CT CERVICAL SPINE FINDINGS  There is  no fracture or spondylolisthesis. Prevertebral soft tissues and predental space regions are normal. There is moderately severe disc space narrowing at C6-7 and C7-T1. There is milder narrowing at C4-5 and C5-6. There is mild narrowing at C3-4. There is no nerve root edema or effacement. There is calcification in the posterior longitudinal ligament at C4 and C5. No demonstrable disc extrusion or high-grade stenosis.  There is alveolar opacity in each lung apex, consistent with either edema or possibly contusion.  IMPRESSION: Large soft tissue hematoma over the left frontal bone. Nondisplaced fracture inferior left frontal bone extending to the superior anterior orbital rim. No intracranial mass, hemorrhage, or extra-axial fluid collection. Gray-white compartments appear normal.  CT maxillofacial: Nondisplaced fractures of the anterior superior left orbital rim as well as the posterior left maxillary sinus wall. No other fractures are apparent. No dislocation. Extensive paranasal sinus disease. Obstruction of both ostiomeatal unit complexes, presumably due to blood. Soft tissue swelling noted over both upper facial and preseptal orbital regions. Swelling greater on the left than on the right. More superiorly, there is a hematoma over the left frontal bone.  CT cervical spine: Areas of osteoarthritic change. Calcification in the posterior longitudinal ligament at C4 and C5. No acute fracture or spondylolisthesis. Question edema versus contusion in the lung apices bilaterally.   Electronically Signed   By: Bretta Bang III M.D.   On: 05/17/2015 19:58    They benefited maximally from their hospital stay and there were no complications.     Disposition: 01-Home or Self Care      Follow-up Information    Follow up with [name], MD In 2 weeks.   Specialty:  Orthopedic Surgery   Contact information:   [address] [place] [address] [phone]         Signed: [name] 05/31/2015, 8:33 AM

## 2015-06-01 NOTE — Discharge Summary (Signed)
Physician Discharge Summary  Patient ID: Alvin Allen MRN: [id number] DOB/AGE: [birth year] 54 y.o.  Admit date: 05/17/2015 Discharge date: 06/01/2015  Discharge Diagnoses Patient Active Problem List   Diagnosis Date Noted  . Fracture of right distal radius 05/30/2015  . CAP (community acquired pneumonia) 05/21/2015  . Syncope and collapse 05/21/2015  . Fall 05/18/2015  . Multiple facial fractures 05/18/2015  . Acute blood loss anemia 05/18/2015  . Hypomagnesemia 05/18/2015  . QT prolongation 05/18/2015  . Rhabdomyolysis 05/17/2015  . Elevated troponin   . Hyperkalemia   . Altered mental status   . OSA (obstructive sleep apnea) 08/28/2011  . Insomnia 08/28/2011    Consultants Dr. Mechele Dawley for critical care medicine  Dr. Nevin Bloodgood for cardiology  Dr. Elspeth Cho for neurology  Dr. Leata Mouse for psychiatry  Dr. Knute Neu for hand surgery   Procedures None  HPI: [name] fell earlier on the day of admission. This was unwitnessed and he was down for an unknown period of time. He was evaluated in the ED and was alert and oriented to person but no place or time and was acting erratically. His workup included CT scans of the head, face, cervical spine, and chest which showed the above-mentioned injuries. He was admitted to the trauma service and critical care medicine and cardiology were consulted for his rhabdomyolysis and elevated troponin respectively.   Hospital Course: The patient's rhabdomyolysis and troponins resolved with conservative measures. His confusion gradually cleared and he complained of some wrist pain. X-rays showed a wrist fracture and hand surgery was consulted. They recommended initial non-operative treatment. Psychiatry was consulted as well. He was mobilized with physical and occupational therapies and did well. Once he was cleared by cardiology he was discharged home in the care of his wife.     Medication List    TAKE these  medications        clonazePAM 0.5 MG tablet  Commonly known as:  KLONOPIN  Take 0.5 mg by mouth 2 (two) times daily as needed for anxiety.     FETZIMA 80 MG Cp24  Generic drug:  Levomilnacipran HCl ER  Take 80 mg by mouth daily.     lamoTRIgine 100 MG tablet  Commonly known as:  LAMICTAL  Take 200 mg by mouth daily.         Signed: Freeman Caldron, PA-C Pager: [phone] General Trauma PA Pager: [phone] 06/01/2015, 3:29 PM

## 2015-06-05 ENCOUNTER — Other Ambulatory Visit: Payer: Self-pay | Admitting: *Deleted

## 2015-08-31 ENCOUNTER — Emergency Department (HOSPITAL_COMMUNITY)
Admission: EM | Admit: 2015-08-31 | Discharge: 2015-08-31 | Disposition: A | Discharge status: Home / Self Care | Payer: BC Managed Care – PPO | Attending: Emergency Medicine | Admitting: Emergency Medicine

## 2015-08-31 ENCOUNTER — Encounter (HOSPITAL_COMMUNITY): Payer: Self-pay | Admitting: Emergency Medicine

## 2015-08-31 ENCOUNTER — Emergency Department (HOSPITAL_COMMUNITY): Payer: BC Managed Care – PPO

## 2015-08-31 DIAGNOSIS — Z8719 Personal history of other diseases of the digestive system: Secondary | ICD-10-CM | POA: Insufficient documentation

## 2015-08-31 DIAGNOSIS — Z87438 Personal history of other diseases of male genital organs: Secondary | ICD-10-CM | POA: Insufficient documentation

## 2015-08-31 DIAGNOSIS — Z8781 Personal history of (healed) traumatic fracture: Secondary | ICD-10-CM | POA: Diagnosis not present

## 2015-08-31 DIAGNOSIS — Z8639 Personal history of other endocrine, nutritional and metabolic disease: Secondary | ICD-10-CM | POA: Diagnosis not present

## 2015-08-31 DIAGNOSIS — Z8739 Personal history of other diseases of the musculoskeletal system and connective tissue: Secondary | ICD-10-CM | POA: Diagnosis not present

## 2015-08-31 DIAGNOSIS — F419 Anxiety disorder, unspecified: Secondary | ICD-10-CM | POA: Insufficient documentation

## 2015-08-31 DIAGNOSIS — Z8709 Personal history of other diseases of the respiratory system: Secondary | ICD-10-CM | POA: Diagnosis not present

## 2015-08-31 DIAGNOSIS — G4700 Insomnia, unspecified: Secondary | ICD-10-CM | POA: Insufficient documentation

## 2015-08-31 DIAGNOSIS — F17210 Nicotine dependence, cigarettes, uncomplicated: Secondary | ICD-10-CM | POA: Diagnosis not present

## 2015-08-31 DIAGNOSIS — Z8701 Personal history of pneumonia (recurrent): Secondary | ICD-10-CM | POA: Diagnosis not present

## 2015-08-31 DIAGNOSIS — Z9884 Bariatric surgery status: Secondary | ICD-10-CM | POA: Insufficient documentation

## 2015-08-31 DIAGNOSIS — Z79899 Other long term (current) drug therapy: Secondary | ICD-10-CM | POA: Insufficient documentation

## 2015-08-31 DIAGNOSIS — F329 Major depressive disorder, single episode, unspecified: Secondary | ICD-10-CM | POA: Diagnosis not present

## 2015-08-31 DIAGNOSIS — Z8669 Personal history of other diseases of the nervous system and sense organs: Secondary | ICD-10-CM | POA: Diagnosis not present

## 2015-08-31 DIAGNOSIS — R4182 Altered mental status, unspecified: Secondary | ICD-10-CM | POA: Diagnosis present

## 2015-08-31 DIAGNOSIS — Z862 Personal history of diseases of the blood and blood-forming organs and certain disorders involving the immune mechanism: Secondary | ICD-10-CM | POA: Insufficient documentation

## 2015-08-31 DIAGNOSIS — R404 Transient alteration of awareness: Secondary | ICD-10-CM

## 2015-08-31 LAB — COMPREHENSIVE METABOLIC PANEL
ALK PHOS: 89 U/L (ref 38–126)
ALT: 22 U/L (ref 17–63)
AST: 17 U/L (ref 15–41)
Albumin: 3.1 g/dL — ABNORMAL LOW (ref 3.5–5.0)
Anion gap: 8 (ref 5–15)
BILIRUBIN TOTAL: 0.7 mg/dL (ref 0.3–1.2)
BUN: 8 mg/dL (ref 6–20)
CALCIUM: 8.7 mg/dL — AB (ref 8.9–10.3)
CO2: 24 mmol/L (ref 22–32)
CREATININE: 0.85 mg/dL (ref 0.61–1.24)
Chloride: 106 mmol/L (ref 101–111)
GFR calc non Af Amer: >60 mL/min (ref >60)
GLUCOSE: 92 mg/dL (ref 65–99)
Potassium: 3.2 mmol/L — ABNORMAL LOW (ref 3.5–5.1)
SODIUM: 138 mmol/L (ref 135–145)
TOTAL PROTEIN: 5.9 g/dL — AB (ref 6.5–8.1)

## 2015-08-31 LAB — CBC
HCT: 35.2 % — ABNORMAL LOW (ref 39.0–52.0)
Hemoglobin: 12 g/dL — ABNORMAL LOW (ref 13.0–17.0)
MCH: 32.1 pg (ref 26.0–34.0)
MCHC: 34.1 g/dL (ref 30.0–36.0)
MCV: 94.1 fL (ref 78.0–100.0)
PLATELETS: 218 10*3/uL (ref 150–400)
RBC: 3.74 MIL/uL — ABNORMAL LOW (ref 4.22–5.81)
RDW: 14.2 % (ref 11.5–15.5)
WBC: 8.4 10*3/uL (ref 4.0–10.5)

## 2015-08-31 NOTE — ED Notes (Signed)
Patient refuses to keep cardiac monitoring wires, bp or respiratory monitor on at this time.

## 2015-08-31 NOTE — ED Provider Notes (Signed)
CSN: [id number]     Arrival date & time 08/31/15  [id number] History   First MD Initiated Contact with Patient 08/31/15 [phone]     Chief Complaint  Patient presents with  . Altered Mental Status     (Consider location/radiation/quality/duration/timing/severity/associated sxs/prior Treatment) Patient is a 54 y.o. male presenting with altered mental status. The history is provided by the patient and the spouse.  Altered Mental Status Presenting symptoms: behavior changes, confusion, disorientation and memory loss   Severity:  Moderate Most recent episode:  Today Episode history:  Single Duration:  10 minutes Timing:  Sporadic Progression:  Resolved Chronicity:  New Context comment:  Bipolar with ongoing insomnia difficulty Associated symptoms: no fever, no slurred speech and no suicidal behavior   Associated symptoms comment:  Difficulty with word finding this morning, resolved   Past Medical History  Diagnosis Date  . Allergic rhinitis   . Anemia   . Arthritis   . Atypical chest pain   . Benign prostatic hypertrophy with urinary obstruction   . Bipolar disorder (HCC)   . Calculus of salivary duct   . Hypokalemia   . Insomnia   . Multiple vitamin deficiency   . Nicotine dependence   . Obesity   . Hard, firm prostate   . Sialoadenitis   . H/O: facial fractures 05/18/15  . Skull fracture with contusion (HCC) 05/18/15  . Rhabdomyolysis 05/18/15    after fall  . Prolonged QT interval   . OSA (obstructive sleep apnea)     does not use cpap  . Pneumonia   . Anxiety   . Depression    Past Surgical History  Procedure Laterality Date  . Gastric bypass  2006  . Appendectomy  1980s  . Biospy prostate    . Knee arthroscopy  1995  . Gastric bypass    . Rhinologic surgery    . Tonsillectomy and adenoidectomy    . Uvuloplasty    . Colonoscopy    . Orif wrist fracture Right 05/30/2015    Procedure: OPEN REDUCTION INTERNAL FIXATION (ORIF) RIGHT WRIST FRACTURE;  Surgeon: Bradly BienenstockFred Ortmann,  MD;  Location: [facility] OR;  Service: Orthopedics;  Laterality: Right;   Family History  Problem Relation Age of Onset  . Heart attack Maternal Grandfather   . Hyperlipidemia Mother   . Macular degeneration Father    Social History  Substance Use Topics  . Smoking status: Current Every Day Smoker -- 0.00 packs/day for 29 years    Types: E-cigarettes, Cigarettes  . Smokeless tobacco: Never Used     Comment: 11/03/11 smoking 15 cigs daily  . Alcohol Use: Yes     Comment: hx of "dependency" on alcohol    Review of Systems  Constitutional: Negative for fever.  Psychiatric/Behavioral: Positive for memory loss and confusion.  All other systems reviewed and are negative.     Allergies  Review of patient's allergies indicates no known allergies.  Home Medications   Prior to Admission medications   Medication Sig Start Date End Date Taking? Authorizing Provider  BELSOMRA 10 MG TABS Take 20 mg by mouth at bedtime. 03/02/15   Historical Provider, MD  clonazePAM (KLONOPIN) 0.5 MG tablet Take 0.5 mg by mouth 2 (two) times daily as needed for anxiety.    Historical Provider, MD  docusate sodium (COLACE) 100 MG capsule Take 1 capsule (100 mg total) by mouth 2 (two) times daily. 05/30/15   Bradly BienenstockFred Ortmann, MD  ESZOPICLONE 3 MG tablet Take 1 tablet by mouth At  bedtime as needed. 08/01/11   Historical Provider, MD  lamoTRIgine (LAMICTAL) 100 MG tablet Take 200 mg by mouth daily.    Historical Provider, MD  Levomilnacipran HCl ER (FETZIMA) 80 MG CP24 Take 80 mg by mouth daily.    Historical Provider, MD  lurasidone (LATUDA) 40 MG TABS tablet Take 40 mg by mouth.    Historical Provider, MD  methocarbamol (ROBAXIN) 500 MG tablet Take 1 tablet (500 mg total) by mouth 4 (four) times daily. 05/30/15   Bradly Bienenstock, MD  Multiple Vitamin (MULTIVITAMIN) capsule Take 1 capsule by mouth daily.      Historical Provider, MD  temazepam (RESTORIL) 15 MG capsule Take 2 tablets by mouth At bedtime. 09/04/11   Historical  Provider, MD  vitamin C (ASCORBIC ACID) 500 MG tablet Take 1 tablet (500 mg total) by mouth daily. 05/30/15   Bradly Bienenstock, MD   BP 130/92 mmHg  Pulse 71  Temp(Src) 98.9 F (37.2 C) (Oral)  Resp 16  Ht  (1.778 m)  Wt 183 lb (83.008 kg)  BMI 26.26 kg/m2  SpO2 100% Physical Exam  Constitutional: He is oriented to person, place, and time. He appears well-developed and well-nourished. He does not appear ill. No distress.  Disheveled, poor grooming, dirt under fingernails, dried cracked lips, poor eye contact  HENT:  Head: Normocephalic and atraumatic.  Eyes: Conjunctivae are normal.  Neck: Neck supple. No tracheal deviation present.  Cardiovascular: Normal rate and regular rhythm.   Pulmonary/Chest: Effort normal. No respiratory distress.  Abdominal: Soft. He exhibits no distension.  Neurological: He is alert and oriented to person, place, and time. He has normal strength. No cranial nerve deficit. Coordination and gait normal. GCS eye subscore is 4. GCS verbal subscore is 5. GCS motor subscore is 6.  Skin: Skin is warm and dry.  Psychiatric: His behavior is normal. His affect is blunt. His speech is not slurred. He exhibits a depressed mood.    ED Course  Procedures (including critical care time) Labs Review Labs Reviewed  COMPREHENSIVE METABOLIC PANEL - Abnormal; Notable for the following:    Potassium 3.2 (*)    Calcium 8.7 (*)    Total Protein 5.9 (*)    Albumin 3.1 (*)    All other components within normal limits  CBC - Abnormal; Notable for the following:    RBC 3.74 (*)    Hemoglobin 12.0 (*)    HCT 35.2 (*)    All other components within normal limits  CBG MONITORING, ED    Imaging Review Ct Head Wo Contrast  08/31/2015  CLINICAL DATA:  Altered mental status/confusion EXAM: CT HEAD WITHOUT CONTRAST TECHNIQUE: Contiguous axial images were obtained from the base of the skull through the vertex without intravenous contrast. COMPARISON:  None. FINDINGS: The  ventricles are normal in size and configuration. There is a minimal cavum septum pellucidum, an anatomic variant. There is no intracranial mass, hemorrhage, extra-axial fluid collection, or midline shift. There is minimal small vessel disease in the centra semiovale bilaterally. Mild basal ganglia calcification bilaterally is felt to most likely be physiologic. Elsewhere gray-white compartments are normal. No acute infarct is evident. There is evidence of a prior fracture of the superior orbital rim on the left. Alignment in this area is anatomic. Elsewhere bony calvarium appears intact. Visualized mastoid air cells are clear. There are retention cysts in the left maxillary antrum. There also areas of opacification in several ethmoid air cells bilaterally. No intraorbital lesions are identified. IMPRESSION: Slight periventricular  small vessel disease. No intracranial mass, hemorrhage, or acute infarct. Prior fracture, nondisplaced, along the superior left orbital rim/inferior left frontal bone. No intraorbital lesion appreciable. Small retention cysts noted in the left maxillary antrum. Patchy ethmoid sinus disease bilaterally. Electronically Signed   By: Bretta Bang III M.D.   On: 08/31/2015 08:41   I have personally reviewed and evaluated these images and lab results as part of my medical decision-making.   EKG Interpretation   Date/Time:  Friday August 31 2015 06:31:39 EST Ventricular Rate:  72 PR Interval:  163 QRS Duration: 150 QT Interval:  449 QTC Calculation: 491 R Axis:   74 Text Interpretation:  Sinus arrhythmia incomplete Right bundle branch  block No significant change since last tracing Interpretation limited  secondary to artifact Confirmed by Sharley Keeler MD, Keerat Denicola ([id number]) on 08/31/2015  7:14:54 AM      MDM   Final diagnoses:  Transient alteration of awareness  Insomnia   54 y.o. male presents with ongoing difficulty with insomnia, speech difficulty this morning that was  transient in nature, disheveled appearance and evidence of decompensating bipolar. Nonfocal neurologic exam and negative head CT, screening labs today. Do not feel this represents an acute ischemic stroke or TIA currently. The patient is alert and oriented, has no suicidal or homicidal ideation, does not wish to undergo any sort of inpatient management if not medically indicated and wife is in agreement with plan. I recommended they consider immediate follow-up with his psychiatrist for stabilization and possible inpatient management given his current appearance. No medical indication for admission currently.    Lyndal Pulley, MD 08/31/15 0900

## 2015-08-31 NOTE — ED Notes (Signed)
MD at bedside. 

## 2015-08-31 NOTE — Discharge Instructions (Signed)
Insomnia Insomnia is a sleep disorder that makes it difficult to fall asleep or to stay asleep. Insomnia can cause tiredness (fatigue), low energy, difficulty concentrating, mood swings, and poor performance at work or school.  There are three different ways to classify insomnia:  Difficulty falling asleep.  Difficulty staying asleep.  Waking up too early in the morning. Any type of insomnia can be long-term (chronic) or short-term (acute). Both are common. Short-term insomnia usually lasts for three months or less. Chronic insomnia occurs at least three times a week for longer than three months. CAUSES  Insomnia may be caused by another condition, situation, or substance, such as:  Anxiety.  Certain medicines.  Gastroesophageal reflux disease (GERD) or other gastrointestinal conditions.  Asthma or other breathing conditions.  Restless legs syndrome, sleep apnea, or other sleep disorders.  Chronic pain.  Menopause. This may include hot flashes.  Stroke.  Abuse of alcohol, tobacco, or illegal drugs.  Depression.  Caffeine.   Neurological disorders, such as Alzheimer disease.  An overactive thyroid (hyperthyroidism). The cause of insomnia may not be known. RISK FACTORS Risk factors for insomnia include:  Gender. Women are more commonly affected than men.  Age. Insomnia is more common as you get older.  Stress. This may involve your professional or personal life.  Income. Insomnia is more common in people with lower income.  Lack of exercise.   Irregular work schedule or night shifts.  Traveling between different time zones. SIGNS AND SYMPTOMS If you have insomnia, trouble falling asleep or trouble staying asleep is the main symptom. This may lead to other symptoms, such as:  Feeling fatigued.  Feeling nervous about going to sleep.  Not feeling rested in the morning.  Having trouble concentrating.  Feeling irritable, anxious, or depressed. TREATMENT   Treatment for insomnia depends on the cause. If your insomnia is caused by an underlying condition, treatment will focus on addressing the condition. Treatment may also include:   Medicines to help you sleep.  Counseling or therapy.  Lifestyle adjustments. HOME CARE INSTRUCTIONS   Take medicines only as directed by your health care provider.  Keep regular sleeping and waking hours. Avoid naps.  Keep a sleep diary to help you and your health care provider figure out what could be causing your insomnia. Include:   When you sleep.  When you wake up during the night.  How well you sleep.   How rested you feel the next day.  Any side effects of medicines you are taking.  What you eat and drink.   Make your bedroom a comfortable place where it is easy to fall asleep:  Put up shades or special blackout curtains to block light from outside.  Use a white noise machine to block noise.  Keep the temperature cool.   Exercise regularly as directed by your health care provider. Avoid exercising right before bedtime.  Use relaxation techniques to manage stress. Ask your health care provider to suggest some techniques that may work well for you. These may include:  Breathing exercises.  Routines to release muscle tension.  Visualizing peaceful scenes.  Cut back on alcohol, caffeinated beverages, and cigarettes, especially close to bedtime. These can disrupt your sleep.  Do not overeat or eat spicy foods right before bedtime. This can lead to digestive discomfort that can make it hard for you to sleep.  Limit screen use before bedtime. This includes:  Watching TV.  Using your smartphone, tablet, and computer.  Stick to a routine. This   can help you fall asleep faster. Try to do a quiet activity, brush your teeth, and go to bed at the same time each night.  Get out of bed if you are still awake after 15 minutes of trying to sleep. Keep the lights down, but try reading or  doing a quiet activity. When you feel sleepy, go back to bed.  Make sure that you drive carefully. Avoid driving if you feel very sleepy.  Keep all follow-up appointments as directed by your health care provider. This is important. SEEK MEDICAL CARE IF:   You are tired throughout the day or have trouble in your daily routine due to sleepiness.  You continue to have sleep problems or your sleep problems get worse. SEEK IMMEDIATE MEDICAL CARE IF:   You have serious thoughts about hurting yourself or someone else.   This information is not intended to replace advice given to you by your health care provider. Make sure you discuss any questions you have with your health care provider.   Document Released: 08/22/2000 Document Revised: 05/16/2015 Document Reviewed: 05/26/2014 Elsevier Interactive Patient Education 2016 [employer]  

## 2015-08-31 NOTE — ED Notes (Signed)
PT ambulated with baseline gait; VSS; A&Ox3; no signs of distress; respirations even and unlabored; skin warm and dry; no questions upon discharge.  

## 2015-08-31 NOTE — ED Notes (Signed)
Pt brought to ED by [facility] from home for AMS, family state last seen well at 1 am and called EMS around 5 am because pt was acting confuse and having some aphasia, pt is having some insomnia lately, pt is AO x 4, denies any pain or discomfort at this time. BP 132/92, HR 88, R 20, 98% RA. CBG 159.

## 2015-08-31 NOTE — ED Notes (Signed)
Pt refusing to have IV placed and states "I have the right to refuse the amount of care that I will like to" pt encouraged that with his sx we'll need to have IV access so we can better help him and get his lab draw, pt still said he will not let us give him an IV, MD to be notified.

## 2015-08-31 NOTE — ED Notes (Signed)
Patient transported to CT 

## 2015-09-01 ENCOUNTER — Emergency Department (HOSPITAL_COMMUNITY): Payer: BC Managed Care – PPO

## 2015-09-01 ENCOUNTER — Encounter (HOSPITAL_COMMUNITY): Payer: Self-pay | Admitting: Emergency Medicine

## 2015-09-01 ENCOUNTER — Emergency Department (HOSPITAL_COMMUNITY)
Admission: EM | Admit: 2015-09-01 | Discharge: 2015-09-02 | Disposition: A | Discharge status: Home / Self Care | Payer: BC Managed Care – PPO | Attending: Emergency Medicine | Admitting: Emergency Medicine

## 2015-09-01 ENCOUNTER — Inpatient Hospital Stay (HOSPITAL_COMMUNITY): Admission: AD | Admit: 2015-09-01 | Payer: BC Managed Care – PPO | Source: Intra-hospital | Admitting: Psychiatry

## 2015-09-01 DIAGNOSIS — E669 Obesity, unspecified: Secondary | ICD-10-CM | POA: Insufficient documentation

## 2015-09-01 DIAGNOSIS — R4182 Altered mental status, unspecified: Secondary | ICD-10-CM | POA: Diagnosis present

## 2015-09-01 DIAGNOSIS — Z8701 Personal history of pneumonia (recurrent): Secondary | ICD-10-CM | POA: Diagnosis not present

## 2015-09-01 DIAGNOSIS — F17210 Nicotine dependence, cigarettes, uncomplicated: Secondary | ICD-10-CM | POA: Diagnosis not present

## 2015-09-01 DIAGNOSIS — R4781 Slurred speech: Secondary | ICD-10-CM

## 2015-09-01 DIAGNOSIS — Z8781 Personal history of (healed) traumatic fracture: Secondary | ICD-10-CM | POA: Diagnosis not present

## 2015-09-01 DIAGNOSIS — R21 Rash and other nonspecific skin eruption: Secondary | ICD-10-CM | POA: Diagnosis not present

## 2015-09-01 DIAGNOSIS — Z8739 Personal history of other diseases of the musculoskeletal system and connective tissue: Secondary | ICD-10-CM | POA: Diagnosis not present

## 2015-09-01 DIAGNOSIS — Z862 Personal history of diseases of the blood and blood-forming organs and certain disorders involving the immune mechanism: Secondary | ICD-10-CM | POA: Insufficient documentation

## 2015-09-01 DIAGNOSIS — F23 Brief psychotic disorder: Secondary | ICD-10-CM

## 2015-09-01 DIAGNOSIS — Z87438 Personal history of other diseases of male genital organs: Secondary | ICD-10-CM | POA: Diagnosis not present

## 2015-09-01 DIAGNOSIS — Z79899 Other long term (current) drug therapy: Secondary | ICD-10-CM | POA: Diagnosis not present

## 2015-09-01 DIAGNOSIS — F319 Bipolar disorder, unspecified: Secondary | ICD-10-CM | POA: Diagnosis not present

## 2015-09-01 DIAGNOSIS — Z8719 Personal history of other diseases of the digestive system: Secondary | ICD-10-CM | POA: Insufficient documentation

## 2015-09-01 DIAGNOSIS — R600 Localized edema: Secondary | ICD-10-CM | POA: Diagnosis not present

## 2015-09-01 DIAGNOSIS — G4700 Insomnia, unspecified: Secondary | ICD-10-CM | POA: Diagnosis not present

## 2015-09-01 LAB — COMPREHENSIVE METABOLIC PANEL
ALK PHOS: 91 U/L (ref 38–126)
ALT: 22 U/L (ref 17–63)
AST: 16 U/L (ref 15–41)
Albumin: 3.3 g/dL — ABNORMAL LOW (ref 3.5–5.0)
Anion gap: 9 (ref 5–15)
BILIRUBIN TOTAL: 0.9 mg/dL (ref 0.3–1.2)
BUN: 11 mg/dL (ref 6–20)
CALCIUM: 8.5 mg/dL — AB (ref 8.9–10.3)
CO2: 23 mmol/L (ref 22–32)
CREATININE: 1.11 mg/dL (ref 0.61–1.24)
Chloride: 104 mmol/L (ref 101–111)
GFR calc non Af Amer: >60 mL/min (ref >60)
Glucose, Bld: 89 mg/dL (ref 65–99)
Potassium: 3.1 mmol/L — ABNORMAL LOW (ref 3.5–5.1)
Sodium: 136 mmol/L (ref 135–145)
TOTAL PROTEIN: 6.1 g/dL — AB (ref 6.5–8.1)

## 2015-09-01 LAB — RAPID URINE DRUG SCREEN, HOSP PERFORMED
AMPHETAMINES: NOT DETECTED
BENZODIAZEPINES: NOT DETECTED
Barbiturates: NOT DETECTED
COCAINE: NOT DETECTED
Opiates: NOT DETECTED
Tetrahydrocannabinol: POSITIVE — AB

## 2015-09-01 LAB — URINALYSIS, ROUTINE W REFLEX MICROSCOPIC
Bilirubin Urine: NEGATIVE
Glucose, UA: NEGATIVE mg/dL
Hgb urine dipstick: NEGATIVE
KETONES UR: NEGATIVE mg/dL
LEUKOCYTES UA: NEGATIVE
NITRITE: NEGATIVE
PROTEIN: NEGATIVE mg/dL
Specific Gravity, Urine: 1.006 (ref 1.005–1.030)
pH: 6 (ref 5.0–8.0)

## 2015-09-01 LAB — CBC WITH DIFFERENTIAL/PLATELET
BASOS ABS: 0 10*3/uL (ref 0.0–0.1)
BASOS PCT: 0 %
EOS ABS: 0.2 10*3/uL (ref 0.0–0.7)
Eosinophils Relative: 3 %
HCT: 31.8 % — ABNORMAL LOW (ref 39.0–52.0)
HEMOGLOBIN: 10.8 g/dL — AB (ref 13.0–17.0)
Lymphocytes Relative: 19 %
Lymphs Abs: 1.1 10*3/uL (ref 0.7–4.0)
MCH: 31.8 pg (ref 26.0–34.0)
MCHC: 34 g/dL (ref 30.0–36.0)
MCV: 93.5 fL (ref 78.0–100.0)
MONOS PCT: 8 %
Monocytes Absolute: 0.5 10*3/uL (ref 0.1–1.0)
NEUTROS PCT: 70 %
Neutro Abs: 4.1 10*3/uL (ref 1.7–7.7)
Platelets: 206 10*3/uL (ref 150–400)
RBC: 3.4 MIL/uL — AB (ref 4.22–5.81)
RDW: 14.2 % (ref 11.5–15.5)
WBC: 5.9 10*3/uL (ref 4.0–10.5)

## 2015-09-01 LAB — ETHANOL

## 2015-09-01 LAB — AMMONIA: Ammonia: 19 umol/L (ref 9–35)

## 2015-09-01 LAB — CBG MONITORING, ED: Glucose-Capillary: 84 mg/dL (ref 65–99)

## 2015-09-01 MED ORDER — POTASSIUM CHLORIDE CRYS ER 20 MEQ PO TBCR
40.0000 meq | EXTENDED_RELEASE_TABLET | Freq: Once | ORAL | Status: AC
  Administered 2015-09-01: 40 meq via ORAL
  Filled 2015-09-01: qty 2

## 2015-09-01 MED ORDER — CLONAZEPAM 0.5 MG PO TABS
1.0000 mg | ORAL_TABLET | Freq: Two times a day (BID) | ORAL | Status: DC | PRN
  Administered 2015-09-01: 1 mg via ORAL
  Filled 2015-09-01: qty 2

## 2015-09-01 MED ORDER — LURASIDONE HCL 60 MG PO TABS
60.0000 mg | ORAL_TABLET | Freq: Every day | ORAL | Status: DC
  Administered 2015-09-01 – 2015-09-02 (×2): 60 mg via ORAL
  Filled 2015-09-01 (×2): qty 60

## 2015-09-01 MED ORDER — ACETAMINOPHEN 500 MG PO TABS: 500.0000 mg | ORAL_TABLET | Freq: Four times a day (QID) | ORAL | Status: DC | PRN

## 2015-09-01 MED ORDER — LEVOMILNACIPRAN HCL ER 80 MG PO CP24: 80.0000 mg | ORAL_CAPSULE | Freq: Every day | ORAL | Status: DC

## 2015-09-01 MED ORDER — LORAZEPAM 2 MG/ML IJ SOLN
1.0000 mg | Freq: Once | INTRAMUSCULAR | Status: AC
  Administered 2015-09-01: 2 mg via INTRAVENOUS
  Filled 2015-09-01: qty 1

## 2015-09-01 MED ORDER — VITAMIN C 500 MG PO TABS: 500.0000 mg | ORAL_TABLET | Freq: Every day | ORAL | Status: DC

## 2015-09-01 MED ORDER — ZOLPIDEM TARTRATE 5 MG PO TABS: 5.0000 mg | ORAL_TABLET | Freq: Every evening | ORAL | Status: DC | PRN

## 2015-09-01 MED ORDER — LAMOTRIGINE 100 MG PO TABS
200.0000 mg | ORAL_TABLET | Freq: Every day | ORAL | Status: DC
  Administered 2015-09-01: 200 mg via ORAL
  Filled 2015-09-01: qty 2

## 2015-09-01 MED ORDER — DOCUSATE SODIUM 100 MG PO CAPS
100.0000 mg | ORAL_CAPSULE | Freq: Two times a day (BID) | ORAL | Status: DC
  Administered 2015-09-01 – 2015-09-02 (×2): 100 mg via ORAL
  Filled 2015-09-01 (×2): qty 1

## 2015-09-01 NOTE — ED Provider Notes (Signed)
CSN: [id number]     Arrival date & time 09/01/15  7829 History   First MD Initiated Contact with Patient 09/01/15 [id number]     Chief Complaint  Patient presents with  . Altered Mental Status   Level V caveat altered mental status History is obtained from nursing who took report from EMS. Patient arrived prior to beginning of my shift. I attempted to call his wife by telephone at 7:10 AM for further history. No answer (Consider location/radiation/quality/duration/timing/severity/associated sxs/prior Treatment) HPI  Patient brought by EMS reportedly called by patient's wife patient was reportedly "not acting like himself this morning EMS reported that he was somewhat difficult to arouse upon their arrival but he walked down the driveway. Patient does not answer all of my questions. He is disoriented. He was seen in the ED yesterday for similar complaint. Refer to psychiatry and discharged from the ED. He presently offers no complaint Past Medical History  Diagnosis Date  . Allergic rhinitis   . Anemia   . Arthritis   . Atypical chest pain   . Benign prostatic hypertrophy with urinary obstruction   . Bipolar disorder (HCC)   . Calculus of salivary duct   . Hypokalemia   . Insomnia   . Multiple vitamin deficiency   . Nicotine dependence   . Obesity   . Hard, firm prostate   . Sialoadenitis   . H/O: facial fractures 05/18/15  . Skull fracture with contusion (HCC) 05/18/15  . Rhabdomyolysis 05/18/15    after fall  . Prolonged QT interval   . OSA (obstructive sleep apnea)     does not use cpap  . Pneumonia   . Anxiety   . Depression    Past Surgical History  Procedure Laterality Date  . Gastric bypass  2006  . Appendectomy  1980s  . Biospy prostate    . Knee arthroscopy  1995  . Gastric bypass    . Rhinologic surgery    . Tonsillectomy and adenoidectomy    . Uvuloplasty    . Colonoscopy    . Orif wrist fracture Right 05/30/2015    Procedure: OPEN REDUCTION INTERNAL FIXATION (ORIF)  RIGHT WRIST FRACTURE;  Surgeon: Bradly Bienenstock, MD;  Location: [facility] OR;  Service: Orthopedics;  Laterality: Right;   Family History  Problem Relation Age of Onset  . Heart attack Maternal Grandfather   . Hyperlipidemia Mother   . Macular degeneration Father    Social History  Substance Use Topics  . Smoking status: Current Every Day Smoker -- 0.00 packs/day for 29 years    Types: E-cigarettes, Cigarettes  . Smokeless tobacco: Never Used     Comment: 11/03/11 smoking 15 cigs daily  . Alcohol Use: Yes     Comment: hx of "dependency" on alcohol    Review of Systems  Unable to perform ROS: Mental status change      Allergies  Review of patient's allergies indicates no known allergies.  Home Medications   Prior to Admission medications   Medication Sig Start Date End Date Taking? Authorizing Provider  BELSOMRA 10 MG TABS Take 20 mg by mouth at bedtime. 03/02/15   Historical Provider, MD  clonazePAM (KLONOPIN) 0.5 MG tablet Take 0.5 mg by mouth 2 (two) times daily as needed for anxiety.    Historical Provider, MD  docusate sodium (COLACE) 100 MG capsule Take 1 capsule (100 mg total) by mouth 2 (two) times daily. 05/30/15   Bradly Bienenstock, MD  ESZOPICLONE 3 MG tablet Take 1 tablet  by mouth At bedtime as needed. 08/01/11   Historical Provider, MD  lamoTRIgine (LAMICTAL) 100 MG tablet Take 200 mg by mouth daily.    Historical Provider, MD  Levomilnacipran HCl ER (FETZIMA) 80 MG CP24 Take 80 mg by mouth daily.    Historical Provider, MD  lurasidone (LATUDA) 40 MG TABS tablet Take 40 mg by mouth.    Historical Provider, MD  methocarbamol (ROBAXIN) 500 MG tablet Take 1 tablet (500 mg total) by mouth 4 (four) times daily. 05/30/15   Bradly Bienenstock, MD  Multiple Vitamin (MULTIVITAMIN) capsule Take 1 capsule by mouth daily.      Historical Provider, MD  temazepam (RESTORIL) 15 MG capsule Take 2 tablets by mouth At bedtime. 09/04/11   Historical Provider, MD  vitamin C (ASCORBIC ACID) 500 MG tablet Take  1 tablet (500 mg total) by mouth daily. 05/30/15   Bradly Bienenstock, MD   BP 136/102 mmHg  Pulse 80  Temp(Src) 98.9 F (37.2 C) (Oral)  SpO2 97% Physical Exam  Constitutional:  Unkempt appearing  HENT:  Head: Normocephalic and atraumatic.  Eyes: Conjunctivae are normal. Pupils are equal, round, and reactive to light.  Neck: Neck supple. No tracheal deviation present. No thyromegaly present.  Cardiovascular: Normal rate and regular rhythm.   No murmur heard. Pulmonary/Chest: Effort normal and breath sounds normal.  Abdominal: Soft. Bowel sounds are normal. He exhibits no distension. There is no tenderness.  Genitourinary: Penis normal.  Musculoskeletal: Normal range of motion. He exhibits edema. He exhibits no tenderness.  3+ edema of bilateral lower extremity  Neurological: He is alert. Coordination normal.  Oriented to name and date does not know the location. Moves all extremities cranial nerves II through XII grossly intact follow simple commands. Gait is unassisted but slightly unsteady  Skin: Skin is warm and dry. Rash noted.  Chronic appearing pinkish macular rash on trunk  Psychiatric: He has a normal mood and affect.  Nursing note and vitals reviewed.   ED Course  Procedures (including critical care time) Labs Review Labs Reviewed - No data to display  Imaging Review Ct Head Wo Contrast  08/31/2015  CLINICAL DATA:  Altered mental status/confusion EXAM: CT HEAD WITHOUT CONTRAST TECHNIQUE: Contiguous axial images were obtained from the base of the skull through the vertex without intravenous contrast. COMPARISON:  None. FINDINGS: The ventricles are normal in size and configuration. There is a minimal cavum septum pellucidum, an anatomic variant. There is no intracranial mass, hemorrhage, extra-axial fluid collection, or midline shift. There is minimal small vessel disease in the centra semiovale bilaterally. Mild basal ganglia calcification bilaterally is felt to most likely be  physiologic. Elsewhere gray-white compartments are normal. No acute infarct is evident. There is evidence of a prior fracture of the superior orbital rim on the left. Alignment in this area is anatomic. Elsewhere bony calvarium appears intact. Visualized mastoid air cells are clear. There are retention cysts in the left maxillary antrum. There also areas of opacification in several ethmoid air cells bilaterally. No intraorbital lesions are identified. IMPRESSION: Slight periventricular small vessel disease. No intracranial mass, hemorrhage, or acute infarct. Prior fracture, nondisplaced, along the superior left orbital rim/inferior left frontal bone. No intraorbital lesion appreciable. Small retention cysts noted in the left maxillary antrum. Patchy ethmoid sinus disease bilaterally. Electronically Signed   By: Bretta Bang III M.D.   On: 08/31/2015 08:41   I have personally reviewed and evaluated these images and lab results as part of my medical decision-making.   EKG  Interpretation   Date/Time:  Saturday September 01 2015 07:28:12 EST Ventricular Rate:  95 PR Interval:  174 QRS Duration: 111 QT Interval:  389 QTC Calculation: 489 R Axis:   87 Text Interpretation:  Sinus rhythm ST elev, probable normal early repol  pattern Borderline prolonged QT interval Artifact in lead(s) V5 and  baseline wander in lead(s) V5 No significant change since last tracing  Confirmed by Ethelda ChickJACUBOWITZ  MD, Bram Hottel [phone] on 09/01/2015 7:44:26 AM     At 8 AM I interviewed patient's wife by telephone. He has had a decline gradual onset over the past 4 days, where he's been delusional calling his son "God" and "Jesus". He also exhibited word salad yesterday morning and has had somewhat unsteady gait since yesterday. He has only slept approximately 4 hours in the past 3 days. He's been agitated at times, yelling and screaming this morning. His wife also reports that the edema on his legs has been there for the entire 18  years that she has known him and rash on his trunk has been there for several years as well. She feels that he is having an exacerbation of his bipolar disorder. He ran out of his  latuda to several days ago.   4:30 PM patient is alert, Glasgow Coma Score 15. He wishes to leave the hospital. I feel that he is a potential danger to himself and delusional. He will be involuntarily committed by me. Involuntary commitment papers and first exam forms filled out by me. Results for orders placed or performed during the hospital encounter of 09/01/15  Comprehensive metabolic panel  Result Value Ref Range   Sodium 136 135 - 145 mmol/L   Potassium 3.1 (L) 3.5 - 5.1 mmol/L   Chloride 104 101 - 111 mmol/L   CO2 23 22 - 32 mmol/L   Glucose, Bld 89 65 - 99 mg/dL   BUN 11 6 - 20 mg/dL   Creatinine, Ser 1.911.11 0.61 - 1.24 mg/dL   Calcium 8.5 (L) 8.9 - 10.3 mg/dL   Total Protein 6.1 (L) 6.5 - 8.1 g/dL   Albumin 3.3 (L) 3.5 - 5.0 g/dL   AST 16 15 - 41 U/L   ALT 22 17 - 63 U/L   Alkaline Phosphatase 91 38 - 126 U/L   Total Bilirubin 0.9 0.3 - 1.2 mg/dL   GFR calc non Af Amer >60 >60 mL/min   GFR calc Af Amer >60 >60 mL/min   Anion gap 9 5 - 15  CBC with Differential/Platelet  Result Value Ref Range   WBC 5.9 4.0 - 10.5 K/uL   RBC 3.40 (L) 4.22 - 5.81 MIL/uL   Hemoglobin 10.8 (L) 13.0 - 17.0 g/dL   HCT 47.831.8 (L) 29.539.0 - 62.152.0 %   MCV 93.5 78.0 - 100.0 fL   MCH 31.8 26.0 - 34.0 pg   MCHC 34.0 30.0 - 36.0 g/dL   RDW 30.814.2 65.711.5 - 84.615.5 %   Platelets 206 150 - 400 K/uL   Neutrophils Relative % 70 %   Neutro Abs 4.1 1.7 - 7.7 K/uL   Lymphocytes Relative 19 %   Lymphs Abs 1.1 0.7 - 4.0 K/uL   Monocytes Relative 8 %   Monocytes Absolute 0.5 0.1 - 1.0 K/uL   Eosinophils Relative 3 %   Eosinophils Absolute 0.2 0.0 - 0.7 K/uL   Basophils Relative 0 %   Basophils Absolute 0.0 0.0 - 0.1 K/uL  Ethanol  Result Value Ref Range   Alcohol, Ethyl (B) <  5 <5 mg/dL  Urinalysis, Routine w reflex microscopic (not at  [facility])  Result Value Ref Range   Color, Urine YELLOW YELLOW   APPearance CLEAR CLEAR   Specific Gravity, Urine 1.006 1.005 - 1.030   pH 6.0 5.0 - 8.0   Glucose, UA NEGATIVE NEGATIVE mg/dL   Hgb urine dipstick NEGATIVE NEGATIVE   Bilirubin Urine NEGATIVE NEGATIVE   Ketones, ur NEGATIVE NEGATIVE mg/dL   Protein, ur NEGATIVE NEGATIVE mg/dL   Nitrite NEGATIVE NEGATIVE   Leukocytes, UA NEGATIVE NEGATIVE  Urine rapid drug screen (hosp performed)  Result Value Ref Range   Opiates NONE DETECTED NONE DETECTED   Cocaine NONE DETECTED NONE DETECTED   Benzodiazepines NONE DETECTED NONE DETECTED   Amphetamines NONE DETECTED NONE DETECTED   Tetrahydrocannabinol POSITIVE (A) NONE DETECTED   Barbiturates NONE DETECTED NONE DETECTED  Ammonia  Result Value Ref Range   Ammonia 19 9 - 35 umol/L  CBG monitoring, ED  Result Value Ref Range   Glucose-Capillary 84 65 - 99 mg/dL   Dg Chest 2 View  45/40/9811  CLINICAL DATA:  Altered mental status. EXAM: CHEST  2 VIEW COMPARISON:  None. FINDINGS: The heart size and mediastinal contours are within normal limits. The lungs are hyperinflated. There is no focal infiltrate, pulmonary edema, or pleural effusion. Degenerative joint changes of the spine are noted. IMPRESSION: No active cardiopulmonary disease.  Emphysema. Electronically Signed   By: Sherian Rein M.D.   On: 09/01/2015 08:37   Ct Head Wo Contrast  09/01/2015  CLINICAL DATA:  Altered mental status EXAM: CT HEAD WITHOUT CONTRAST TECHNIQUE: Contiguous axial images were obtained from the base of the skull through the vertex without contrast. COMPARISON:  08/31/2015 FINDINGS: No acute intracranial hemorrhage, mass lesion, definite infarction, midline shift, herniation, hydrocephalus, or extra-axial fluid collection. No focal mass effect or edema. Ventricles are symmetric. Cisterns are patent. No cerebellar abnormality. Orbits are symmetric. Mastoids remain clear. Minor scattered sinus disease and retention  cyst in the left maxillary sinus. IMPRESSION: Stable head CT without acute intracranial abnormality or interval change. Electronically Signed   By: Judie Petit.  Shick M.D.   On: 09/01/2015 10:46   Ct Head Wo Contrast  08/31/2015  CLINICAL DATA:  Altered mental status/confusion EXAM: CT HEAD WITHOUT CONTRAST TECHNIQUE: Contiguous axial images were obtained from the base of the skull through the vertex without intravenous contrast. COMPARISON:  None. FINDINGS: The ventricles are normal in size and configuration. There is a minimal cavum septum pellucidum, an anatomic variant. There is no intracranial mass, hemorrhage, extra-axial fluid collection, or midline shift. There is minimal small vessel disease in the centra semiovale bilaterally. Mild basal ganglia calcification bilaterally is felt to most likely be physiologic. Elsewhere gray-white compartments are normal. No acute infarct is evident. There is evidence of a prior fracture of the superior orbital rim on the left. Alignment in this area is anatomic. Elsewhere bony calvarium appears intact. Visualized mastoid air cells are clear. There are retention cysts in the left maxillary antrum. There also areas of opacification in several ethmoid air cells bilaterally. No intraorbital lesions are identified. IMPRESSION: Slight periventricular small vessel disease. No intracranial mass, hemorrhage, or acute infarct. Prior fracture, nondisplaced, along the superior left orbital rim/inferior left frontal bone. No intraorbital lesion appreciable. Small retention cysts noted in the left maxillary antrum. Patchy ethmoid sinus disease bilaterally. Electronically Signed   By: Bretta Bang III M.D.   On: 08/31/2015 08:41    MDM  MRI of brain ordered to check  for stroke given history of "word salad", slurred speech, and slight difficulty walking. Patient required sedation,, as he could not cooperate with MRI scan. He was medicated with Ativan IV. He could still not cooperate  with MRI scan. I consulted the neurology service to evaluate patient. Neurology requested CT scan of brain without contrast. Which was ordered by me. Final diagnoses:  None  Dr Zigmund Daniel examined pt and feels pt is clear fo to be seen by pschiatry. TTS consulted by me and interviewed patient and recommended inpatient psychiatric stay Diagnosis #1 acute psychosis #2 hypokalemia     Doug Sou, MD 09/01/15 1644

## 2015-09-01 NOTE — ED Notes (Signed)
Pt back from CT, sleeping. Wife at bedside

## 2015-09-01 NOTE — ED Notes (Signed)
[id number] after 2100.

## 2015-09-01 NOTE — ED Notes (Signed)
Per the patient's wife, patient is not "acting like himself". Pt was seen yesterday for the same reason. Pt with psychiatric history, stable vital signs, and normal CBG at this time.

## 2015-09-01 NOTE — ED Notes (Signed)
Pt in MRI.

## 2015-09-01 NOTE — ED Notes (Signed)
TTS completed. 

## 2015-09-01 NOTE — ED Notes (Signed)
Tech sitting with pt for safety.

## 2015-09-01 NOTE — ED Notes (Signed)
Sp[facility] and will be transported after 2100 this evening.

## 2015-09-01 NOTE — ED Notes (Signed)
Magistrate advised she has approved IVC papers - states may be a while before she processes.

## 2015-09-01 NOTE — ED Notes (Signed)
Dr Ethelda ChickJacubowitz aware pt is now awake and ready for TTS.

## 2015-09-01 NOTE — ED Notes (Signed)
MRI called and pt not tolerating MRI- crawling out of scanner. Notified MD, will bring pt back at this time

## 2015-09-01 NOTE — ED Notes (Signed)
Pt in blue scrubs- will change to maroon once pt is more alert and security has wanded pt

## 2015-09-01 NOTE — ED Notes (Signed)
[facility] called to notify RN that patient no longer has a bed at accepting facility. [facility].

## 2015-09-01 NOTE — ED Notes (Signed)
Safety sitter at bedside 

## 2015-09-01 NOTE — ED Notes (Signed)
Pt's wife will accompany him to CT to see if pt can tolerate scan

## 2015-09-01 NOTE — ED Notes (Addendum)
Pt pulling at lines. Removed iv from left AC. Pt questions why he is here but denies confusion. Then has sentence trail off. Alert and oriented to place and time. Words some what slurred.but doesn't remember removing items.

## 2015-09-01 NOTE — ED Notes (Signed)
Updated pt's spouse via phone - advised her inpt tx recommended - states she is not surprised. She requested to be notified when pt is accepted to a facility - [phone].

## 2015-09-01 NOTE — ED Notes (Signed)
Per Dr Ethelda ChickJacubowitz, administer Potassium when pt awakens. Also, advise when pt awakens so TTS may be ordered.

## 2015-09-01 NOTE — ED Notes (Signed)
Pt arrived from MRI. Pt confused- but knows he is at hospital and it's 2016. Pt mumbling incomprehensible words at times.

## 2015-09-01 NOTE — BH Assessment (Addendum)
Tele Assessment Note   Alvin Allen is an 54 y.o. male. Pt presents voluntarily to [facility] today after being d/c from [facility] yesterday for similar complaint. Per chart review, wife reports pt had had a gradual decline over the past 4 days, where he's been delusional calling his son "God" and "Jesus". Pt is cooperative and restless. He is oriented to person, date and place but isn't oriented to situation. Pt reports insomnia for past few days. He acts irrationally at times. His speech is slurred and word salad is present occasionally. He endorses depressed mood. Pt says current stressors include his not finding a job and his daughter who is transitioning to a male, Alvin Allen. He ruminates about his "mental decline in memory and processing information". Pt endorses poor concentration and impairment in remote and recent memory. Pt reports he was first hospitalized for bipolar d/o in his 1s when he lived in [place]. He reports other psychiatric hospitalizations but doesn't provide additional info like dates or facility names. Pt sts he sees Dr Donell Beers on an outpatient basis. Pt is difficult to redirect and is hyperverbal.He tries to get out of bed.     Diagnosis: Bipolar I Disorder  Past Medical History:  Past Medical History  Diagnosis Date  . Allergic rhinitis   . Anemia   . Arthritis   . Atypical chest pain   . Benign prostatic hypertrophy with urinary obstruction   . Bipolar disorder (HCC)   . Calculus of salivary duct   . Hypokalemia   . Insomnia   . Multiple vitamin deficiency   . Nicotine dependence   . Obesity   . Hard, firm prostate   . Sialoadenitis   . H/O: facial fractures 05/18/15  . Skull fracture with contusion (HCC) 05/18/15  . Rhabdomyolysis 05/18/15    after fall  . Prolonged QT interval   . OSA (obstructive sleep apnea)     does not use cpap  . Pneumonia   . Anxiety   . Depression     Past Surgical History  Procedure Laterality Date  . Gastric bypass  2006  . Appendectomy  1980s   . Biospy prostate    . Knee arthroscopy  1995  . Gastric bypass    . Rhinologic surgery    . Tonsillectomy and adenoidectomy    . Uvuloplasty    . Colonoscopy    . Orif wrist fracture Right 05/30/2015    Procedure: OPEN REDUCTION INTERNAL FIXATION (ORIF) RIGHT WRIST FRACTURE;  Surgeon: Bradly Bienenstock, MD;  Location: [facility] OR;  Service: Orthopedics;  Laterality: Right;    Family History:  Family History  Problem Relation Age of Onset  . Heart attack Maternal Grandfather   . Hyperlipidemia Mother   . Macular degeneration Father     Social History:  reports that he has been smoking E-cigarettes and Cigarettes.  He has been smoking about 0.00 packs per day for the past 29 years. He has never used smokeless tobacco. He reports that he drinks alcohol. He reports that he does not use illicit drugs.  Additional Social History:  Alcohol / Drug Use Pain Medications: pt denies abuse Prescriptions: pt denies abuse Over the Counter: pt denies abuse History of alcohol / drug use?: Yes Substance #1 Name of Substance 1: marijuana 1 - Age of First Use: unknown  1 - Amount (size/oz): "a few puffs" 1 - Last Use / Amount: Pt sts rarely uses marijuana   CIWA: CIWA-Ar BP: (!) 144/102 mmHg Pulse Rate: 84 COWS:  PATIENT STRENGTHS: (choose at least two) Average or above average intelligence Communication skills Supportive family/friends  Allergies: No Known Allergies  Home Medications:  (Not in a hospital admission)  OB/GYN Status:  No LMP for male patient.  General Assessment Data Location of Assessment: [facility] ED TTS Assessment: Out of system Is this a Tele or Face-to-Face Assessment?: Tele Assessment Is this an Initial Assessment or a Re-assessment for this encounter?: Initial Assessment Marital status: Married Living Arrangements: Spouse/significant other, Children (37 yo son, 46 yo daughter transitioning to male Alvin Allen) Can pt return to current living arrangement?: Yes Admission Status:  Voluntary Is patient capable of signing voluntary admission?: Yes Referral Source: Self/Family/Friend Insurance type: Blue cross     Crisis Care Plan Living Arrangements: Spouse/significant other, Children (93 yo son, 42 yo daughter transitioning to male Alvin Allen) Name of Psychiatrist: Dr Archer Asa Name of Therapist: none  Education Status Is patient currently in school?: No Highest grade of school patient has completed: 40 Name of school: [employer]  Risk to self with the past 6 months Suicidal Ideation: No Has patient been a risk to self within the past 6 months prior to admission? : No Suicidal Intent: No Has patient had any suicidal intent within the past 6 months prior to admission? : No Is patient at risk for suicide?: No Suicidal Plan?: No Has patient had any suicidal plan within the past 6 months prior to admission? : No Access to Means: No What has been your use of drugs/alcohol within the last 12 months?: rarely uses marijuana Previous Attempts/Gestures: No How many times?: 0 Other Self Harm Risks: none Triggers for Past Attempts:  (n/a) Intentional Self Injurious Behavior: None Family Suicide History: No Recent stressful life event(s): Other (Comment) (unable to find job, FTM son) Persecutory voices/beliefs?: No Depression: Yes Depression Symptoms: Feeling worthless/self pity, Insomnia Substance abuse history and/or treatment for substance abuse?: No (pt denies) Suicide prevention information given to non-admitted patients: Not applicable  Risk to Others within the past 6 months Homicidal Ideation: No Does patient have any lifetime risk of violence toward others beyond the six months prior to admission? : No Thoughts of Harm to Others: No Current Homicidal Intent: No Current Homicidal Plan: No Access to Homicidal Means: No Identified Victim: none History of harm to others?: No Assessment of Violence: None Noted Violent Behavior Description: pt denies hx  violence Does patient have access to weapons?:  (unable to assess) Criminal Charges Pending?: No Does patient have a court date: No Is patient on probation?: No  Psychosis Hallucinations: None noted Delusions: None noted (pt denies. wife sts pt been calling his son "god" and "jesus)  Mental Status Report Appearance/Hygiene: Unremarkable, In scrubs Eye Contact: Good Motor Activity: Freedom of movement, Restlessness, Hyperactivity Speech: Logical/coherent, Word salad, Slurred Level of Consciousness: Alert, Restless Mood: Depressed Affect: Appropriate to circumstance (confused) Anxiety Level: Minimal Thought Processes: Relevant, Coherent, Circumstantial Judgement: Impaired Orientation: Person, Place, Time Obsessive Compulsive Thoughts/Behaviors: Unable to Assess  Cognitive Functioning Concentration: Decreased Memory: Remote Impaired, Recent Impaired IQ: Average Insight: Fair Impulse Control: Fair Appetite: Fair Weight Loss:  (pt sts had gastric bypass surgery and 450 lbs at heaviest) Sleep: Decreased Total Hours of Sleep: 2 (in two days) Vegetative Symptoms: None  ADLScreening [facility] Assessment Services) Patient's cognitive ability adequate to safely complete daily activities?: Yes Patient able to express need for assistance with ADLs?: Yes Independently performs ADLs?: Yes (appropriate for developmental age)  Prior Inpatient Therapy Prior Inpatient Therapy: Yes Prior Therapy Dates: over several  years starting in his early 20's Prior Therapy Facilty/Provider(s): in [place], doesn't give other facility names Reason for Treatment: "out of myself" in psychosis  Prior Outpatient Therapy Prior Outpatient Therapy: Yes Prior Therapy Dates: currently and in the past Prior Therapy Facilty/Provider(s): Dr Donell BeersPlovsky and others Reason for Treatment: bipolar d/o Does patient have an ACCT team?: No Does patient have Intensive In-House Services?  : No Does patient have [facility] services? :  Unknown Does patient have P4CC services?: Unknown  ADL Screening (condition at time of admission) Patient's cognitive ability adequate to safely complete daily activities?: Yes Is the patient deaf or have difficulty hearing?: No Does the patient have difficulty seeing, even when wearing glasses/contacts?: No Does the patient have difficulty concentrating, remembering, or making decisions?: Yes Patient able to express need for assistance with ADLs?: Yes Does the patient have difficulty dressing or bathing?: No Independently performs ADLs?: Yes (appropriate for developmental age) Does the patient have difficulty walking or climbing stairs?: No Weakness of Legs: None Weakness of Arms/Hands: None  Home Assistive Devices/Equipment Home Assistive Devices/Equipment: Eyeglasses    Abuse/Neglect Assessment (Assessment to be complete while patient is alone) Physical Abuse: Denies Verbal Abuse: Yes, past (Comment) Sexual Abuse: Denies Exploitation of patient/patient's resources: Denies Self-Neglect: Denies     Merchant navy officerAdvance Directives (For Healthcare) Does patient have an advance directive?: No Would patient like information on creating an advanced directive?: No - patient declined information    Additional Information 1:1 In Past 12 Months?: No CIRT Risk: No Elopement Risk: Yes Does patient have medical clearance?: Yes     Disposition:  Disposition Initial Assessment Completed for this Encounter: Yes Disposition of Patient: Inpatient treatment program Type of inpatient treatment program: Adult (may agustin np recommends inpatient treatment to stabilizie)  Keiry Kowal P 09/01/2015 4:17 PM

## 2015-09-01 NOTE — ED Notes (Signed)
Pt ambulated to hallway then back to room - unsteady gait noted - only has on blue shirt. Stating needs to empty his bladder - pt noted w/garbled speech. Assisted pt to sit in chair while waiting for urinal. Then assisted pt w/using urinal. Pt then returned to lying on stretcher - warm blankets given for comfort. Showed pt call bell and asked for him to press it prior to attempting to get out of bed. Pt unaware in hospital. Resting now quietly w/eyes closed.

## 2015-09-01 NOTE — ED Notes (Signed)
Pt continues to as "is this real."  This RN explained the people in the room were real, the blood pressure cuff is real, the bed is real. Pt asked if this "is the after life." This RN told pt that he had a heart rate and he was breathing, he had a blood pressure.  Pt continues to ask if he is dead.

## 2015-09-01 NOTE — Consult Note (Signed)
NEURO HOSPITALIST CONSULT NOTE      Reason for Consult: unsteady gait, slurred/pressured speech  HPI:                                                                                                                                          Alvin Allen is an 54 y.o. male with hx of bipolar disorder on multiple medications who comes in after having some delusions, pressured and slurred speech, unsteady gait but no falls and hallucinations. Patients wife states she found marijuana on him and after he uses it he usually has the above symptoms. This has been going on for a few days now. CTH was negative for acute abnormality. Could not do MRI due to agitation   Past Medical History  Diagnosis Date  . Allergic rhinitis   . Anemia   . Arthritis   . Atypical chest pain   . Benign prostatic hypertrophy with urinary obstruction   . Bipolar disorder (HCC)   . Calculus of salivary duct   . Hypokalemia   . Insomnia   . Multiple vitamin deficiency   . Nicotine dependence   . Obesity   . Hard, firm prostate   . Sialoadenitis   . H/O: facial fractures 05/18/15  . Skull fracture with contusion (HCC) 05/18/15  . Rhabdomyolysis 05/18/15    after fall  . Prolonged QT interval   . OSA (obstructive sleep apnea)     does not use cpap  . Pneumonia   . Anxiety   . Depression     Past Surgical History  Procedure Laterality Date  . Gastric bypass  2006  . Appendectomy  1980s  . Biospy prostate    . Knee arthroscopy  1995  . Gastric bypass    . Rhinologic surgery    . Tonsillectomy and adenoidectomy    . Uvuloplasty    . Colonoscopy    . Orif wrist fracture Right 05/30/2015    Procedure: OPEN REDUCTION INTERNAL FIXATION (ORIF) RIGHT WRIST FRACTURE;  Surgeon: Bradly Bienenstock, MD;  Location: [facility] OR;  Service: Orthopedics;  Laterality: Right;    Family History  Problem Relation Age of Onset  . Heart attack Maternal Grandfather   . Hyperlipidemia Mother   . Macular degeneration  Father       Social History:  reports that he has been smoking E-cigarettes and Cigarettes.  He has been smoking about 0.00 packs per day for the past 29 years. He has never used smokeless tobacco. He reports that he drinks alcohol. He reports that he does not use illicit drugs.  No Known Allergies  MEDICATIONS:  I have reviewed the patient's current medications.   ROS:                                                                                                                                       History obtained from spouse  General ROS: negative for - chills, fatigue, fever, night sweats, weight gain or weight loss Psychological ROS: negative for - behavioral disorder, hallucinations, memory difficulties, mood swings or suicidal ideation Ophthalmic ROS: negative for - blurry vision, double vision, eye pain or loss of vision ENT ROS: negative for - epistaxis, nasal discharge, oral lesions, sore throat, tinnitus or vertigo Allergy and Immunology ROS: negative for - hives or itchy/watery eyes Hematological and Lymphatic ROS: negative for - bleeding problems, bruising or swollen lymph nodes Endocrine ROS: negative for - galactorrhea, hair pattern changes, polydipsia/polyuria or temperature intolerance Respiratory ROS: negative for - cough, hemoptysis, shortness of breath or wheezing Cardiovascular ROS: negative for - chest pain, dyspnea on exertion, edema or irregular heartbeat Gastrointestinal ROS: negative for - abdominal pain, diarrhea, hematemesis, nausea/vomiting or stool incontinence Genito-Urinary ROS: negative for - dysuria, hematuria, incontinence or urinary frequency/urgency Musculoskeletal ROS: negative for - joint swelling or muscular weakness Neurological ROS: as noted in HPI Dermatological ROS: negative for rash and skin lesion changes   Blood pressure  118/89, pulse 90, temperature 98.1 F (36.7 C), temperature source Rectal, resp. rate 12, SpO2 97 %.   Neurologic Examination:                                                                                                      HEENT-  Normocephalic, no lesions, without obvious abnormality.  Normal external eye and conjunctiva.  Normal TM's bilaterally.  Normal auditory canals and external ears. Normal external nose, mucus membranes and septum.  Normal pharynx. Cardiovascular- regular rate and rhythm, S1, S2 normal, no murmur, click, rub or gallop, pulses palpable throughout   Lungs- chest clear, no wheezing, rales, normal symmetric air entry, Heart exam - S1, S2 normal, no murmur, no gallop, rate regular Abdomen- soft, non-tender; bowel sounds normal; no masses,  no organomegaly   Neurological Examination Mental Status: Alert, oriented to self, speech is pressured but fluent Cranial Nerves: II: Visual fields grossly normal, pupils equal, round, reactive to light and accommodation III,IV, VI: ptosis not present, extra-ocular motions intact bilaterally V,VII: smile symmetric, facial light touch sensation normal bilaterally VIII: hearing normal bilaterally IX,X: uvula rises symmetrically XI: bilateral shoulder shrug XII: midline tongue extension  Motor: Right : Upper extremity   5/5    Left:     Upper extremity   5/5  Lower extremity   5/5     Lower extremity   5/5 B/l LE edema Tone and bulk:normal tone throughout; no atrophy noted Sensory: Pinprick and light touch intact throughout, bilaterally Cerebellar: normal finger-to-nose,   Gait: normal gait as per ED      Lab Results: Basic Metabolic Panel:  Recent Labs Lab 08/31/15 0632 09/01/15 0730  NA 138 136  K 3.2* 3.1*  CL 106 104  CO2 24 23  GLUCOSE 92 89  BUN 8 11  CREATININE 0.85 1.11  CALCIUM 8.7* 8.5*    Liver Function Tests:  Recent Labs Lab 08/31/15 0632 09/01/15 0730  AST 17 16  ALT 22 22  ALKPHOS 89  91  BILITOT 0.7 0.9  PROT 5.9* 6.1*  ALBUMIN 3.1* 3.3*   No results for input(s): LIPASE, AMYLASE in the last 168 hours.  Recent Labs Lab 09/01/15 0730  AMMONIA 19    CBC:  Recent Labs Lab 08/31/15 0632 09/01/15 0730  WBC 8.4 5.9  NEUTROABS  --  4.1  HGB 12.0* 10.8*  HCT 35.2* 31.8*  MCV 94.1 93.5  PLT 218 206    Cardiac Enzymes: No results for input(s): CKTOTAL, CKMB, CKMBINDEX, TROPONINI in the last 168 hours.  Lipid Panel: No results for input(s): CHOL, TRIG, HDL, CHOLHDL, VLDL, LDLCALC in the last 168 hours.  CBG:  Recent Labs Lab 09/01/15 0704  GLUCAP 84    Microbiology: No results found for this or any previous visit.  Coagulation Studies: No results for input(s): LABPROT, INR in the last 72 hours.  Imaging: Dg Chest 2 View  09/01/2015  CLINICAL DATA:  Altered mental status. EXAM: CHEST  2 VIEW COMPARISON:  None. FINDINGS: The heart size and mediastinal contours are within normal limits. The lungs are hyperinflated. There is no focal infiltrate, pulmonary edema, or pleural effusion. Degenerative joint changes of the spine are noted. IMPRESSION: No active cardiopulmonary disease.  Emphysema. Electronically Signed   By: Sherian Rein M.D.   On: 09/01/2015 08:37   Ct Head Wo Contrast  09/01/2015  CLINICAL DATA:  Altered mental status EXAM: CT HEAD WITHOUT CONTRAST TECHNIQUE: Contiguous axial images were obtained from the base of the skull through the vertex without contrast. COMPARISON:  08/31/2015 FINDINGS: No acute intracranial hemorrhage, mass lesion, definite infarction, midline shift, herniation, hydrocephalus, or extra-axial fluid collection. No focal mass effect or edema. Ventricles are symmetric. Cisterns are patent. No cerebellar abnormality. Orbits are symmetric. Mastoids remain clear. Minor scattered sinus disease and retention cyst in the left maxillary sinus. IMPRESSION: Stable head CT without acute intracranial abnormality or interval change.  Electronically Signed   By: Judie Petit.  Shick M.D.   On: 09/01/2015 10:46   Ct Head Wo Contrast  08/31/2015  CLINICAL DATA:  Altered mental status/confusion EXAM: CT HEAD WITHOUT CONTRAST TECHNIQUE: Contiguous axial images were obtained from the base of the skull through the vertex without intravenous contrast. COMPARISON:  None. FINDINGS: The ventricles are normal in size and configuration. There is a minimal cavum septum pellucidum, an anatomic variant. There is no intracranial mass, hemorrhage, extra-axial fluid collection, or midline shift. There is minimal small vessel disease in the centra semiovale bilaterally. Mild basal ganglia calcification bilaterally is felt to most likely be physiologic. Elsewhere gray-white compartments are normal. No acute infarct is evident. There is evidence of a prior fracture of the superior orbital rim on  the left. Alignment in this area is anatomic. Elsewhere bony calvarium appears intact. Visualized mastoid air cells are clear. There are retention cysts in the left maxillary antrum. There also areas of opacification in several ethmoid air cells bilaterally. No intraorbital lesions are identified. IMPRESSION: Slight periventricular small vessel disease. No intracranial mass, hemorrhage, or acute infarct. Prior fracture, nondisplaced, along the superior left orbital rim/inferior left frontal bone. No intraorbital lesion appreciable. Small retention cysts noted in the left maxillary antrum. Patchy ethmoid sinus disease bilaterally. Electronically Signed   By: Bretta Bang III M.D.   On: 08/31/2015 08:41     Assessment/Plan:  54 yo male with hx of bipolar disorder on multiple medications who comes in after having some delusions, pressured and slurred speech, unsteady gait but no falls and hallucinations. Patients wife states she found marijuana on him and after he uses it he usually has the above symptoms. This has been going on for a few days now. CTH was negative for acute  abnormality. Could not do MRI due to agitation   - Given that the symptoms have been going on for a few days should be able to see any acute changes on CTH. CTH is negative, likely unlikely based on history and exam that this is a stroke. No need for any further imagining at this time - Most likely behavioral/psychiatric in nature stemming for [facility] use and family/holiday stressors - Please call back with any questions

## 2015-09-01 NOTE — ED Notes (Addendum)
Dr Ethelda ChickJacubowitz advised pt of tx plan - inpt. Pt continues to state he is not delusional and wants to leave. Dr Ethelda ChickJacubowitz advised pt IVC paperwork is being performed. Pt appears to be upset. Sitting on stretcher w/head lowered.

## 2015-09-01 NOTE — ED Notes (Signed)
Pt sleeping. Pt is moving to Pod C for psych holding. Pt's wife will take home all his belongings (shoes, socks, shirt, pants, Esig, and flashlight). Gave report to [employer] [employer] RN.

## 2015-09-01 NOTE — ED Notes (Signed)
States has 2 children - states son who is 17-y-o and in early college Earl Lites(Gregory) and an almost 16-y-o daughter who wants to be a boy Lanora Manis(Elizabeth who wants to be called Marcello MooresIsaac). States has not had much sleep in 2 weeks d/t a dream he had that he was dying. States feels better now that he slept while in ED. Pt noted w/unsteady gait - voices understanding to use call bell prior to getting up but then gets ups w/o staff. Pt denies being fall risk. Denies use of drugs incl marijuana. States he is disabled so is not working at this time. Pt noted w/mumbled speech as completes sentences intermittently. States he needs to leave so he may get his spouse a Christmas present. Denies SI/HI. States "I am not a harm to myself or others".

## 2015-09-01 NOTE — ED Notes (Signed)
MRI notified RN that pt not cooperating in MRI. RN notified MD. Will give ativan

## 2015-09-01 NOTE — ED Notes (Signed)
Pt states, "This does not feel like reality.  This is not the same universe I came from.  This is a strange reality."  Pt is aware he is slurring some words.  Cannot tell this RN what "reality" he came from.

## 2015-09-01 NOTE — ED Notes (Signed)
TTS being performed.  

## 2015-09-02 NOTE — ED Notes (Signed)
Pt breakfast tray arrived. 

## 2015-09-02 NOTE — Discharge Instructions (Signed)
Psychosis Return if you feel worse for any reason. If you have any thought of harming herself or anyone else call 911 immediately. Otherwise keep your scheduled appointment with Dr.Plovsky or call his office tomorrow to be seen sooner than your scheduled appointment. Psychosis refers to a severe loss of contact with reality. During a psychotic episode, a person is not able to think clearly, and his or her emotions and responses do not match up with what is actually happening. Someone may have false beliefs about what is happening or who they are (delusions). Someone may see, hear, taste, smell, or feel things that are not present (hallucinations).  Psychosis usually occurs with very serious mental health (psychiatric) conditions such as schizophrenia, bipolar disorder, or major depression. It can sometimes also be the result of drug use or certain medical conditions. SYMPTOMS Symptoms of a psychotic episode include:  Delusions, such as:  Feeling excessive fear or suspicion (paranoia).  Believing something that is odd, unrealistic, or false, such as having a false belief about being someone else.  Hallucinations.  Disorganized thinking, such as thoughts that jump from one to another that do not make sense to others.  Disorganized speech, such as saying things that do not make sense to others.  Inappropriate behavior, such as talking to oneself or intruding on unfamiliar people. DIAGNOSIS A diagnosis of psychosis is made through an assessment by a health care provider, who will ask questions about thoughts, feelings, behavior, drug use, and medical conditions. The health care provider may also do one or more of the following:  Physical exam.  Blood tests.  Brain imaging, such as a CT scan or MRI.  Brain wave study (EEG). The health care provider may make a referral for further evaluation by a mental health professional. TREATMENT  Treatment depends on the cause of the psychosis.  Treatment may include one or more of the following:  Monitoring and supportive care in the emergency room or hospital.   Taking medicines (antipsychotic medicine) to reduce symptoms and to balance chemicals in the brain.  Treating an underlying medical condition.  Stopping or reducing drugs that are causing psychosis.  Therapy and other supportive programs outside of the hospital. HOME CARE INSTRUCTIONS  Over-the-counter and prescription medicines should be taken only as told by the health care provider.  The health care provider should be consulted before over-the-counter medicines, herbs, or supplements are used.  All follow-up visits should be kept as told by the health care provider. This is important.  A healthy lifestyle should be maintained. This includes:  Eating a healthy diet.  Getting enough sleep.  Exercising regularly.  Avoiding alcohol and recreational drugs as told by the health care provider. SEEK MEDICAL CARE IF:  Medicines do not seem to be helping.  The person hears voices telling him or her to do things.  The person continues to see, smell, or feel things that are not there.  The person feels extremely fearful and suspicious that someone or something will harm him or her.  The person feels unable to leave his or her house.  The person has trouble taking care of himself or herself.  The person experiences side effects of medicines, such as:  Changes in sleep patterns.  Dizziness.  Weight gain.  Restlessness.  Movement changes.  Muscle spasms.  Tremors. SEEK IMMEDIATE MEDICAL CARE IF:  Serious thoughts occur about self-harm or about hurting others.  There are serious side effects of medicine, such as:  Swelling of the face,  lips, tongue, or throat.  Fever, confusion, muscle spasms, or seizures.   This information is not intended to replace advice given to you by your health care provider. Make sure you discuss any questions you  have with your health care provider.   Document Released: 02/12/2010 Document Revised: 01/09/2015 Document Reviewed: 08/29/2014 Elsevier Interactive Patient Education [employer]

## 2015-09-02 NOTE — ED Provider Notes (Signed)
5:40 PM patient is alert Glasgow Coma Score 15 amylase without difficulty pleasant and cooperative. He has been deemed safe for discharge by psychiatry. His wife is with him and wishes him to go home. He will follow-up with Dr.Plovsky as outpatient Results for orders placed or performed during the hospital encounter of 09/01/15  Comprehensive metabolic panel  Result Value Ref Range   Sodium 136 135 - 145 mmol/L   Potassium 3.1 (L) 3.5 - 5.1 mmol/L   Chloride 104 101 - 111 mmol/L   CO2 23 22 - 32 mmol/L   Glucose, Bld 89 65 - 99 mg/dL   BUN 11 6 - 20 mg/dL   Creatinine, Ser 9.60 0.61 - 1.24 mg/dL   Calcium 8.5 (L) 8.9 - 10.3 mg/dL   Total Protein 6.1 (L) 6.5 - 8.1 g/dL   Albumin 3.3 (L) 3.5 - 5.0 g/dL   AST 16 15 - 41 U/L   ALT 22 17 - 63 U/L   Alkaline Phosphatase 91 38 - 126 U/L   Total Bilirubin 0.9 0.3 - 1.2 mg/dL   GFR calc non Af Amer >60 >60 mL/min   GFR calc Af Amer >60 >60 mL/min   Anion gap 9 5 - 15  CBC with Differential/Platelet  Result Value Ref Range   WBC 5.9 4.0 - 10.5 K/uL   RBC 3.40 (L) 4.22 - 5.81 MIL/uL   Hemoglobin 10.8 (L) 13.0 - 17.0 g/dL   HCT 45.4 (L) 09.8 - 11.9 %   MCV 93.5 78.0 - 100.0 fL   MCH 31.8 26.0 - 34.0 pg   MCHC 34.0 30.0 - 36.0 g/dL   RDW 14.7 82.9 - 56.2 %   Platelets 206 150 - 400 K/uL   Neutrophils Relative % 70 %   Neutro Abs 4.1 1.7 - 7.7 K/uL   Lymphocytes Relative 19 %   Lymphs Abs 1.1 0.7 - 4.0 K/uL   Monocytes Relative 8 %   Monocytes Absolute 0.5 0.1 - 1.0 K/uL   Eosinophils Relative 3 %   Eosinophils Absolute 0.2 0.0 - 0.7 K/uL   Basophils Relative 0 %   Basophils Absolute 0.0 0.0 - 0.1 K/uL  Ethanol  Result Value Ref Range   Alcohol, Ethyl (B) <5 <5 mg/dL  Urinalysis, Routine w reflex microscopic (not at [facility])  Result Value Ref Range   Color, Urine YELLOW YELLOW   APPearance CLEAR CLEAR   Specific Gravity, Urine 1.006 1.005 - 1.030   pH 6.0 5.0 - 8.0   Glucose, UA NEGATIVE NEGATIVE mg/dL   Hgb urine dipstick NEGATIVE  NEGATIVE   Bilirubin Urine NEGATIVE NEGATIVE   Ketones, ur NEGATIVE NEGATIVE mg/dL   Protein, ur NEGATIVE NEGATIVE mg/dL   Nitrite NEGATIVE NEGATIVE   Leukocytes, UA NEGATIVE NEGATIVE  Urine rapid drug screen (hosp performed)  Result Value Ref Range   Opiates NONE DETECTED NONE DETECTED   Cocaine NONE DETECTED NONE DETECTED   Benzodiazepines NONE DETECTED NONE DETECTED   Amphetamines NONE DETECTED NONE DETECTED   Tetrahydrocannabinol POSITIVE (A) NONE DETECTED   Barbiturates NONE DETECTED NONE DETECTED  Ammonia  Result Value Ref Range   Ammonia 19 9 - 35 umol/L  CBG monitoring, ED  Result Value Ref Range   Glucose-Capillary 84 65 - 99 mg/dL   Dg Chest 2 View  13/04/6577  CLINICAL DATA:  Altered mental status. EXAM: CHEST  2 VIEW COMPARISON:  None. FINDINGS: The heart size and mediastinal contours are within normal limits. The lungs are hyperinflated. There is no  focal infiltrate, pulmonary edema, or pleural effusion. Degenerative joint changes of the spine are noted. IMPRESSION: No active cardiopulmonary disease.  Emphysema. Electronically Signed   By: Sherian ReinWei-Chen  Lin M.D.   On: 09/01/2015 08:37   Ct Head Wo Contrast  09/01/2015  CLINICAL DATA:  Altered mental status EXAM: CT HEAD WITHOUT CONTRAST TECHNIQUE: Contiguous axial images were obtained from the base of the skull through the vertex without contrast. COMPARISON:  08/31/2015 FINDINGS: No acute intracranial hemorrhage, mass lesion, definite infarction, midline shift, herniation, hydrocephalus, or extra-axial fluid collection. No focal mass effect or edema. Ventricles are symmetric. Cisterns are patent. No cerebellar abnormality. Orbits are symmetric. Mastoids remain clear. Minor scattered sinus disease and retention cyst in the left maxillary sinus. IMPRESSION: Stable head CT without acute intracranial abnormality or interval change. Electronically Signed   By: Judie PetitM.  Shick M.D.   On: 09/01/2015 10:46   Ct Head Wo Contrast  08/31/2015   CLINICAL DATA:  Altered mental status/confusion EXAM: CT HEAD WITHOUT CONTRAST TECHNIQUE: Contiguous axial images were obtained from the base of the skull through the vertex without intravenous contrast. COMPARISON:  None. FINDINGS: The ventricles are normal in size and configuration. There is a minimal cavum septum pellucidum, an anatomic variant. There is no intracranial mass, hemorrhage, extra-axial fluid collection, or midline shift. There is minimal small vessel disease in the centra semiovale bilaterally. Mild basal ganglia calcification bilaterally is felt to most likely be physiologic. Elsewhere gray-white compartments are normal. No acute infarct is evident. There is evidence of a prior fracture of the superior orbital rim on the left. Alignment in this area is anatomic. Elsewhere bony calvarium appears intact. Visualized mastoid air cells are clear. There are retention cysts in the left maxillary antrum. There also areas of opacification in several ethmoid air cells bilaterally. No intraorbital lesions are identified. IMPRESSION: Slight periventricular small vessel disease. No intracranial mass, hemorrhage, or acute infarct. Prior fracture, nondisplaced, along the superior left orbital rim/inferior left frontal bone. No intraorbital lesion appreciable. Small retention cysts noted in the left maxillary antrum. Patchy ethmoid sinus disease bilaterally. Electronically Signed   By: Bretta BangWilliam  Woodruff III M.D.   On: 08/31/2015 08:41     Doug SouSam Sayaka Hoeppner, MD 09/02/15 1744

## 2015-09-02 NOTE — ED Notes (Addendum)
[facility] and advised is recommending to rescind IVC papers and d/c pt as does not meet criteria. Advised she spoke w/pt's spouse who was able to collaborate info for pt. Advised will enter note for EDP in approx an hour.

## 2015-09-02 NOTE — ED Notes (Signed)
Advised pt's spouse of tx plan - d/c per pt's request. Advised her will call when have d/c paperwork in hand. Voiced appreciation.

## 2015-09-02 NOTE — ED Notes (Signed)
Pt's spouse aware of tx plan and to bring pt's Femtiza from home d/t pharmacy does not have.

## 2015-09-02 NOTE — ED Notes (Signed)
Pt talking w/father on phone.

## 2015-09-02 NOTE — ED Notes (Signed)
Sitter at bedside.

## 2015-09-02 NOTE — ED Notes (Signed)
Pt given a coke and sandwich per request.

## 2015-09-02 NOTE — ED Notes (Signed)
Pt's family has arrived and pt changing into clothing they brought for him.

## 2015-09-02 NOTE — ED Notes (Signed)
IVC papers rescinded - copy faxed to [employer] [employer] Court - original placed in folder for [facility] In[facility]istrate. Copy sent to medical records.

## 2015-09-02 NOTE — Consult Note (Signed)
Telepsych Consultation   Reason for Consult:  Bizarre behavior Referring Physician: [facility] MD Patient Identification: Alvin Allen MRN:  [id number] Principal Diagnosis: Altered mental status Diagnosis:   Patient Active Problem List   Diagnosis Date Noted  . Altered mental status [R41.82]     Priority: High  . Insomnia [G47.00] 08/28/2011    Priority: High  . Fracture of right distal radius [S52.501A] 05/30/2015  . CAP (community acquired pneumonia) [J18.9] 05/21/2015  . Syncope and collapse [R55] 05/21/2015  . Fall [W19.XXXA] 05/18/2015  . Multiple facial fractures ([place]) [S02.92XA] 05/18/2015  . Acute blood loss anemia [D62] 05/18/2015  . Hypomagnesemia [E83.42] 05/18/2015  . QT prolongation [I45.81] 05/18/2015  . Rhabdomyolysis [M62.82] 05/17/2015  . Elevated troponin [R79.89]   . Hyperkalemia [E87.5]   . OSA (obstructive sleep apnea) [G47.33] 08/28/2011    Total Time spent with patient: 45 minutes  Subjective:   Alvin Allen is a 54 y.o. male patient admitted with bizarre behavior.  HPI:  Alvin Allen is an 54 y.o. Male.initially presented voluntarily to [facility] today after being d/c from [facility] yesterday for similar complaint.  Per patient and patient wife, he was acting in a bizarre behavior.  Wife states that patient has had similar episodes in the past and that she knows what "how it will go."  Per chart review, wife reports pt had had a gradual decline over the past 4 days, where he's been delusional calling his son "God" and "Jesus".  The patient was also  cooperative and restless. He is oriented to person, date and place but isn't oriented to situation.   He initially acted acted irrationally, his speech was slurred.  Pt says current stressors include his not finding a job and his daughter who is transitioning to a male, Alvin Allen. He ruminates about his "mental decline in memory and processing information". Pt endorses poor concentration and impairment in remote and recent  memory. Pt reports he was first hospitalized for bipolar d/o in his 35s when he lived in [place]. He reports other psychiatric hospitalizations but doesn't provide additional info like dates or facility names. Pt sts he sees Dr Casimiro Needle on an outpatient basis.   Tele psych completed.  Spoke with patient and collateral information was obtained after speaking with patient's wife.  Patient was coherent and pleasant today.  He is alert and oriented.  Patient states that he could not explain his behavior but did state that  the had been going on little to no sleep in the last few nights.  He states that he has an appt with Dr Casimiro Needle on 1/18 but will move up to see him sooner.  All information above was confirmed with patient and patient;s wife.  Wife states that patient is back to his old self.  Per patient, "we have a family gathering today at 5:30 and it will be nice to be able to make it."  HPI Elements:   See HPI  Past Medical History:  Past Medical History  Diagnosis Date  . Allergic rhinitis   . Anemia   . Arthritis   . Atypical chest pain   . Benign prostatic hypertrophy with urinary obstruction   . Bipolar disorder ([place])   . Calculus of salivary duct   . Hypokalemia   . Insomnia   . Multiple vitamin deficiency   . Nicotine dependence   . Obesity   . Hard, firm prostate   . Sialoadenitis   . H/O: facial fractures 05/18/15  . Skull fracture with contusion (  [place]) 05/18/15  . Rhabdomyolysis 05/18/15    after fall  . Prolonged QT interval   . OSA (obstructive sleep apnea)     does not use cpap  . Pneumonia   . Anxiety   . Depression     Past Surgical History  Procedure Laterality Date  . Gastric bypass  2006  . Appendectomy  1980s  . Biospy prostate    . Knee arthroscopy  1995  . Gastric bypass    . Rhinologic surgery    . Tonsillectomy and adenoidectomy    . Uvuloplasty    . Colonoscopy    . Orif wrist fracture Right 05/30/2015    Procedure: OPEN REDUCTION INTERNAL FIXATION (ORIF)  RIGHT WRIST FRACTURE;  Surgeon: Iran Planas, MD;  Location: [facility];  Service: Orthopedics;  Laterality: Right;   Family History:  Family History  Problem Relation Age of Onset  . Heart attack Maternal Grandfather   . Hyperlipidemia Mother   . Macular degeneration Father    Social History:  History  Alcohol Use  . Yes    Comment: hx of "dependency" on alcohol     History  Drug Use No    Comment: "not for awhile" last use a year ago    Social History   Social History  . Marital Status: Married    Spouse Name: Alvin Allen  . Number of Children: 2  . Years of Education: N/A   Occupational History  . [employer] IT mgmt    Social History Main Topics  . Smoking status: Current Every Day Smoker -- 0.00 packs/day for 29 years    Types: E-cigarettes, Cigarettes  . Smokeless tobacco: Never Used     Comment: 11/03/11 smoking 15 cigs daily  . Alcohol Use: Yes     Comment: hx of "dependency" on alcohol  . Drug Use: No     Comment: "not for awhile" last use a year ago  . Sexual Activity: Yes   Other Topics Concern  . None   Social History Narrative   ** Merged History Encounter **       Additional Social History:    Pain Medications: pt denies abuse Prescriptions: pt denies abuse Over the Counter: pt denies abuse History of alcohol / drug use?: Yes Name of Substance 1: marijuana 1 - Age of First Use: unknown  1 - Amount (size/oz): "a few puffs" 1 - Last Use / Amount: Pt sts rarely uses marijuana                    Allergies:  No Known Allergies  Labs:  Results for orders placed or performed during the hospital encounter of 09/01/15 (from the past 48 hour(s))  CBG monitoring, ED     Status: None   Collection Time: 09/01/15  7:04 AM  Result Value Ref Range   Glucose-Capillary 84 65 - 99 mg/dL  Comprehensive metabolic panel     Status: Abnormal   Collection Time: 09/01/15  7:30 AM  Result Value Ref Range   Sodium 136 135 - 145 mmol/L   Potassium 3.1 (L) 3.5  - 5.1 mmol/L   Chloride 104 101 - 111 mmol/L   CO2 23 22 - 32 mmol/L   Glucose, Bld 89 65 - 99 mg/dL   BUN 11 6 - 20 mg/dL   Creatinine, Ser 1.11 0.61 - 1.24 mg/dL   Calcium 8.5 (L) 8.9 - 10.3 mg/dL   Total Protein 6.1 (L) 6.5 - 8.1 g/dL  Albumin 3.3 (L) 3.5 - 5.0 g/dL   AST 16 15 - 41 U/L   ALT 22 17 - 63 U/L   Alkaline Phosphatase 91 38 - 126 U/L   Total Bilirubin 0.9 0.3 - 1.2 mg/dL   GFR calc non Af Amer >60 >60 mL/min   GFR calc Af Amer >60 >60 mL/min    Comment: (NOTE) The eGFR has been calculated using the CKD EPI equation. This calculation has not been validated in all clinical situations. eGFR's persistently <60 mL/min signify possible Chronic Kidney Disease.    Anion gap 9 5 - 15  CBC with Differential/Platelet     Status: Abnormal   Collection Time: 09/01/15  7:30 AM  Result Value Ref Range   WBC 5.9 4.0 - 10.5 K/uL   RBC 3.40 (L) 4.22 - 5.81 MIL/uL   Hemoglobin 10.8 (L) 13.0 - 17.0 g/dL   HCT 31.8 (L) 39.0 - 52.0 %   MCV 93.5 78.0 - 100.0 fL   MCH 31.8 26.0 - 34.0 pg   MCHC 34.0 30.0 - 36.0 g/dL   RDW 14.2 11.5 - 15.5 %   Platelets 206 150 - 400 K/uL   Neutrophils Relative % 70 %   Neutro Abs 4.1 1.7 - 7.7 K/uL   Lymphocytes Relative 19 %   Lymphs Abs 1.1 0.7 - 4.0 K/uL   Monocytes Relative 8 %   Monocytes Absolute 0.5 0.1 - 1.0 K/uL   Eosinophils Relative 3 %   Eosinophils Absolute 0.2 0.0 - 0.7 K/uL   Basophils Relative 0 %   Basophils Absolute 0.0 0.0 - 0.1 K/uL  Ethanol     Status: None   Collection Time: 09/01/15  7:30 AM  Result Value Ref Range   Alcohol, Ethyl (B) <5 <5 mg/dL    Comment:        LOWEST DETECTABLE LIMIT FOR SERUM ALCOHOL IS 5 mg/dL FOR MEDICAL PURPOSES ONLY   Ammonia     Status: None   Collection Time: 09/01/15  7:30 AM  Result Value Ref Range   Ammonia 19 9 - 35 umol/L  Urinalysis, Routine w reflex microscopic (not at [facility])     Status: None   Collection Time: 09/01/15  8:44 AM  Result Value Ref Range   Color, Urine YELLOW  YELLOW   APPearance CLEAR CLEAR   Specific Gravity, Urine 1.006 1.005 - 1.030   pH 6.0 5.0 - 8.0   Glucose, UA NEGATIVE NEGATIVE mg/dL   Hgb urine dipstick NEGATIVE NEGATIVE   Bilirubin Urine NEGATIVE NEGATIVE   Ketones, ur NEGATIVE NEGATIVE mg/dL   Protein, ur NEGATIVE NEGATIVE mg/dL   Nitrite NEGATIVE NEGATIVE   Leukocytes, UA NEGATIVE NEGATIVE    Comment: MICROSCOPIC NOT DONE ON URINES WITH NEGATIVE PROTEIN, BLOOD, LEUKOCYTES, NITRITE, OR GLUCOSE <1000 mg/dL.  Urine rapid drug screen (hosp performed)     Status: Abnormal   Collection Time: 09/01/15  8:44 AM  Result Value Ref Range   Opiates NONE DETECTED NONE DETECTED   Cocaine NONE DETECTED NONE DETECTED   Benzodiazepines NONE DETECTED NONE DETECTED   Amphetamines NONE DETECTED NONE DETECTED   Tetrahydrocannabinol POSITIVE (A) NONE DETECTED   Barbiturates NONE DETECTED NONE DETECTED    Comment:        DRUG SCREEN FOR MEDICAL PURPOSES ONLY.  IF CONFIRMATION IS NEEDED FOR ANY PURPOSE, NOTIFY LAB WITHIN 5 DAYS.        LOWEST DETECTABLE LIMITS FOR URINE DRUG SCREEN Drug Class  Cutoff (ng/mL) Amphetamine      1000 Barbiturate      200 Benzodiazepine   916 Tricyclics       384 Opiates          300 Cocaine          300 THC              50     Vitals: Blood pressure 141/71, pulse 76, temperature 98.8 F (37.1 C), temperature source Oral, resp. rate 14, SpO2 100 %.  Risk to Self: Suicidal Ideation: No Suicidal Intent: No Is patient at risk for suicide?: No Suicidal Plan?: No Access to Means: No What has been your use of drugs/alcohol within the last 12 months?: rarely uses marijuana How many times?: 0 Other Self Harm Risks: none Triggers for Past Attempts:  (n/a) Intentional Self Injurious Behavior: None Risk to Others: Homicidal Ideation: No Thoughts of Harm to Others: No Current Homicidal Intent: No Current Homicidal Plan: No Access to Homicidal Means: No Identified Victim: none History of harm to others?:  No Assessment of Violence: None Noted Violent Behavior Description: pt denies hx violence Does patient have access to weapons?:  (unable to assess) Criminal Charges Pending?: No Does patient have a court date: No Prior Inpatient Therapy: Prior Inpatient Therapy: Yes Prior Therapy Dates: over several years starting in his early 20's Prior Therapy Facilty/Provider(s): in [place], doesn't give other facility names Reason for Treatment: "out of myself" in psychosis Prior Outpatient Therapy: Prior Outpatient Therapy: Yes Prior Therapy Dates: currently and in the past Prior Therapy Facilty/Provider(s): Dr Casimiro Needle and others Reason for Treatment: bipolar d/o Does patient have an ACCT team?: No Does patient have Intensive In-House Services?  : No Does patient have [facility] services? : Unknown Does patient have P4CC services?: Unknown  Current Facility-Administered Medications  Medication Dose Route Frequency Provider Last Rate Last Dose  . acetaminophen (TYLENOL) tablet 500-1,000 mg  500-1,000 mg Oral Q6H PRN Orlie Dakin, MD      . clonazePAM Bobbye Charleston) tablet 1 mg  1 mg Oral BID PRN Orlie Dakin, MD   1 mg at 09/01/15 6659  . docusate sodium (COLACE) capsule 100 mg  100 mg Oral BID Orlie Dakin, MD   100 mg at 09/02/15 1035  . lamoTRIgine (LAMICTAL) tablet 200 mg  200 mg Oral QHS Orlie Dakin, MD   200 mg at 09/01/15 2245  . Levomilnacipran HCl ER CP24 80 mg  80 mg Oral Daily Orlie Dakin, MD   80 mg at 09/01/15 1821  . Lurasidone HCl TABS 60 mg  60 mg Oral Daily Orlie Dakin, MD   60 mg at 09/02/15 1035  . zolpidem (AMBIEN) tablet 5 mg  5 mg Oral QHS PRN Orlie Dakin, MD       Current Outpatient Prescriptions  Medication Sig Dispense Refill  . acetaminophen (TYLENOL) 500 MG tablet Take 500-1,000 mg by mouth every 6 (six) hours as needed (pain).    . clonazePAM (KLONOPIN) 1 MG tablet Take 1 mg by mouth daily as needed for anxiety.    . Eszopiclone (ESZOPICLONE) 3 MG TABS Take 3 mg  by mouth at bedtime as needed (sleep). Take immediately before bedtime (Lunesta)    . lamoTRIgine (LAMICTAL) 200 MG tablet Take 200 mg by mouth at bedtime.  5  . Levomilnacipran HCl ER (FETZIMA) 80 MG CP24 Take 80 mg by mouth daily.    . Lurasidone HCl (LATUDA) 60 MG TABS Take 60 mg by mouth daily.    Marland Kitchen  methocarbamol (ROBAXIN) 500 MG tablet Take 1 tablet (500 mg total) by mouth 4 (four) times daily. (Patient taking differently: Take 500 mg by mouth 4 (four) times daily as needed for muscle spasms. ) 30 tablet 0  . Multiple Vitamins-Minerals (MULTIVITAMIN GUMMIES ADULT) CHEW Chew 2 tablets by mouth daily.    Marland Kitchen OVER THE COUNTER MEDICATION Place 1 drop into both eyes every 4 (four) hours as needed (dry eyes). Over the counter lubricating eye drops    . OVER THE COUNTER MEDICATION Place 1 drop into both eyes every 4 (four) hours as needed (dry eyes). Over the counter saline eye drops    . Suvorexant (BELSOMRA) 10 MG TABS Take 10 mg by mouth at bedtime as needed (sleep).    . temazepam (RESTORIL) 15 MG capsule Take 15 mg by mouth at bedtime as needed for sleep.     Marland Kitchen docusate sodium (COLACE) 100 MG capsule Take 1 capsule (100 mg total) by mouth 2 (two) times daily. (Patient not taking: Reported on 09/01/2015) 30 capsule 0    Musculoskeletal: Strength & Muscle Tone: within normal limits Gait & Station: normal Patient leans: N/A  Psychiatric Specialty Exam: Physical Exam  Vitals reviewed.   ROS  Blood pressure 141/71, pulse 76, temperature 98.8 F (37.1 C), temperature source Oral, resp. rate 14, SpO2 100 %.There is no weight on file to calculate BMI.  General Appearance: Neat  Eye Contact::  Fair  Speech:  Normal Rate  Volume:  Normal  Mood:  Euthymic  Affect:  Congruent  Thought Process:  Circumstantial and Coherent  Orientation:  Full (Time, Place, and Person)  Thought Content:  Rumination  Suicidal Thoughts:  No  Homicidal Thoughts:  No  Memory:  Immediate;   Fair Recent;    Fair Remote;   Fair  Judgement:  Fair  Insight:  Fair  Psychomotor Activity:  Normal  Concentration:  Fair  Recall:  [employer] [place]  Language: Fair  Akathisia:  Negative  Handed:  Right  AIMS (if indicated):     Assets:  Desire for Improvement Resilience Social Support  ADL's:  Intact  Cognition: WNL  Sleep:      Medical Decision Making: Established Problem, Stable/Improving (1), Review of Psycho-Social Stressors (1), Decision to obtain old records (1) and Review and summation of old records (2)   Treatment Plan Summary: Daily contact with patient to assess and evaluate symptoms and progress in treatment and Medication management  Plan:  No evidence of imminent risk to self or others at present.   Patient does not meet criteria for psychiatric inpatient admission. Supportive therapy provided about ongoing stressors. Discussed crisis plan, support from social network, calling 911, coming to the Emergency Department, and calling Suicide Hotline. Disposition: home with wife.  Will see Dr Casimiro Needle on 1/18 or sooner.  Patient is established with Dr Casimiro Needle.  Freda Munro May Raianna Slight AGNP-BC 09/02/2015 2:47 PM

## 2015-09-02 NOTE — [facility] Counselor (Signed)
09/02/15 [facility] to [facility], [place]  Alvin Dangler K. Jesus GeneraHarris, LPC-A, [facility]  Counselor 09/02/2015 10:47 AM

## 2015-09-02 NOTE — ED Notes (Signed)
Pt on phone at nurses' desk talking w/spouse.  

## 2015-09-02 NOTE — ED Notes (Signed)
Spouse advised pt appears lucid to her this afternoon and is requesting, if possible, for pt to be d/c'd home. States if not, she understands but she feels pt is back to baseline and has dr he has appt with. Shawn, [facility], advised [facility].  May, NP, [facility] - advised she will speak w/pt's spouse and then assess pt.

## 2015-09-14 ENCOUNTER — Emergency Department (HOSPITAL_COMMUNITY): Payer: BC Managed Care – PPO

## 2015-09-14 ENCOUNTER — Emergency Department (HOSPITAL_COMMUNITY)
Admission: EM | Admit: 2015-09-14 | Discharge: 2015-09-14 | Disposition: A | Discharge status: Home / Self Care | Payer: BC Managed Care – PPO | Attending: Emergency Medicine | Admitting: Emergency Medicine

## 2015-09-14 DIAGNOSIS — F319 Bipolar disorder, unspecified: Secondary | ICD-10-CM | POA: Insufficient documentation

## 2015-09-14 DIAGNOSIS — Z8781 Personal history of (healed) traumatic fracture: Secondary | ICD-10-CM | POA: Insufficient documentation

## 2015-09-14 DIAGNOSIS — M1990 Unspecified osteoarthritis, unspecified site: Secondary | ICD-10-CM | POA: Insufficient documentation

## 2015-09-14 DIAGNOSIS — W19XXXA Unspecified fall, initial encounter: Secondary | ICD-10-CM

## 2015-09-14 DIAGNOSIS — R000 Tachycardia, unspecified: Secondary | ICD-10-CM | POA: Diagnosis not present

## 2015-09-14 DIAGNOSIS — S79912A Unspecified injury of left hip, initial encounter: Secondary | ICD-10-CM | POA: Diagnosis present

## 2015-09-14 DIAGNOSIS — F419 Anxiety disorder, unspecified: Secondary | ICD-10-CM | POA: Insufficient documentation

## 2015-09-14 DIAGNOSIS — Z8719 Personal history of other diseases of the digestive system: Secondary | ICD-10-CM | POA: Insufficient documentation

## 2015-09-14 DIAGNOSIS — S7002XA Contusion of left hip, initial encounter: Secondary | ICD-10-CM | POA: Insufficient documentation

## 2015-09-14 DIAGNOSIS — Z79899 Other long term (current) drug therapy: Secondary | ICD-10-CM | POA: Diagnosis not present

## 2015-09-14 DIAGNOSIS — Y9289 Other specified places as the place of occurrence of the external cause: Secondary | ICD-10-CM | POA: Diagnosis not present

## 2015-09-14 DIAGNOSIS — Y999 Unspecified external cause status: Secondary | ICD-10-CM | POA: Diagnosis not present

## 2015-09-14 DIAGNOSIS — F17210 Nicotine dependence, cigarettes, uncomplicated: Secondary | ICD-10-CM | POA: Diagnosis not present

## 2015-09-14 DIAGNOSIS — Z87438 Personal history of other diseases of male genital organs: Secondary | ICD-10-CM | POA: Diagnosis not present

## 2015-09-14 DIAGNOSIS — Y9389 Activity, other specified: Secondary | ICD-10-CM | POA: Diagnosis not present

## 2015-09-14 DIAGNOSIS — Z8701 Personal history of pneumonia (recurrent): Secondary | ICD-10-CM | POA: Insufficient documentation

## 2015-09-14 DIAGNOSIS — E669 Obesity, unspecified: Secondary | ICD-10-CM | POA: Diagnosis not present

## 2015-09-14 DIAGNOSIS — Z862 Personal history of diseases of the blood and blood-forming organs and certain disorders involving the immune mechanism: Secondary | ICD-10-CM | POA: Insufficient documentation

## 2015-09-14 DIAGNOSIS — G4700 Insomnia, unspecified: Secondary | ICD-10-CM | POA: Insufficient documentation

## 2015-09-14 NOTE — Discharge Instructions (Signed)
As discussed, your evaluation today has been largely reassuring.  But, it is important that you monitor your condition carefully, and do not hesitate to return to the ED if you develop new, or concerning changes in your condition. ? ?Otherwise, please follow-up with your physician for appropriate ongoing care. ? ?

## 2015-09-14 NOTE — ED Notes (Signed)
Pt ambulated to restroom, upon returning to room pt refused to be hooked back up the the monitor

## 2015-09-14 NOTE — ED Notes (Signed)
Pt arrived via POV. Pt states that he fell "a couple nights ago" while parking his car and injured his left hip. Swelling and bruising noted to left hip. Pt denies dizziness or lightheadedness. Pt also states that he also fell in the bath tub yesterday. Pt not on blood thinners. Pt alert and oriented x 4.

## 2015-09-14 NOTE — ED Provider Notes (Signed)
CSN: [id number]     Arrival date & time 09/14/15  0439 History   First MD Initiated Contact with Patient 09/14/15 0441     Chief Complaint  Patient presents with  . Fall     (Consider location/radiation/quality/duration/timing/severity/associated sxs/prior Treatment) HPI Patient presents several days after a fall with ongoing pain of the left hip. Patient states that he fell from car. Since the time he has been ambulatory, with no distal loss of sensation or weakness. However, the patient has had persistent sore pain throughout the left lateral hip. There is associated discoloration in the hip. There is no other new complaint. No relief with ibuprofen. Worse with motion.   Patient was called for evaluation, but departed to smoke a cigarette prior to coming back for evaluation. The patient was ambulatory to do this.  Past Medical History  Diagnosis Date  . Allergic rhinitis   . Anemia   . Arthritis   . Atypical chest pain   . Benign prostatic hypertrophy with urinary obstruction   . Bipolar disorder ([facility])   . Calculus of salivary duct   . Hypokalemia   . Insomnia   . Multiple vitamin deficiency   . Nicotine dependence   . Obesity   . Hard, firm prostate   . Sialoadenitis   . H/O: facial fractures 05/18/15  . Skull fracture with contusion ([facility]) 05/18/15  . Rhabdomyolysis 05/18/15    after fall  . Prolonged QT interval   . OSA (obstructive sleep apnea)     does not use cpap  . Pneumonia   . Anxiety   . Depression    Past Surgical History  Procedure Laterality Date  . Gastric bypass  2006  . Appendectomy  1980s  . Biospy prostate    . Knee arthroscopy  1995  . Gastric bypass    . Rhinologic surgery    . Tonsillectomy and adenoidectomy    . Uvuloplasty    . Colonoscopy    . Orif wrist fracture Right 05/30/2015    Procedure: OPEN REDUCTION INTERNAL FIXATION (ORIF) RIGHT WRIST FRACTURE;  Surgeon: Bradly BienenstockFred Ortmann, MD;  Location: [facility];  Service: Orthopedics;  Laterality:  Right;   Family History  Problem Relation Age of Onset  . Heart attack Maternal Grandfather   . Hyperlipidemia Mother   . Macular degeneration Father    Social History  Substance Use Topics  . Smoking status: Current Every Day Smoker -- 0.00 packs/day for 29 years    Types: E-cigarettes, Cigarettes  . Smokeless tobacco: Never Used     Comment: 11/03/11 smoking 15 cigs daily  . Alcohol Use: Yes     Comment: hx of "dependency" on alcohol    Review of Systems  Constitutional:       Per HPI, otherwise negative  HENT:       Per HPI, otherwise negative  Respiratory:       Per HPI, otherwise negative  Cardiovascular:       Per HPI, otherwise negative  Gastrointestinal: Negative for vomiting.  Endocrine:       Negative aside from HPI  Genitourinary:       Neg aside from HPI   Musculoskeletal:       Per HPI, otherwise negative  Skin: Positive for color change. Negative for wound.  Allergic/Immunologic: Negative for immunocompromised state.  Neurological: Negative for syncope and weakness.      Allergies  Review of patient's allergies indicates no known allergies.  Home Medications   Prior to  Admission medications   Medication Sig Start Date End Date Taking? Authorizing Provider  acetaminophen (TYLENOL) 500 MG tablet Take 500-1,000 mg by mouth every 6 (six) hours as needed (pain).    Historical Provider, MD  clonazePAM (KLONOPIN) 1 MG tablet Take 1 mg by mouth daily as needed for anxiety.    Historical Provider, MD  docusate sodium (COLACE) 100 MG capsule Take 1 capsule (100 mg total) by mouth 2 (two) times daily. Patient not taking: Reported on 09/01/2015 05/30/15   Bradly Bienenstock, MD  Eszopiclone (ESZOPICLONE) 3 MG TABS Take 3 mg by mouth at bedtime as needed (sleep). Take immediately before bedtime (Lunesta)    Historical Provider, MD  lamoTRIgine (LAMICTAL) 200 MG tablet Take 200 mg by mouth at bedtime. 05/26/15   Historical Provider, MD  Levomilnacipran HCl ER (FETZIMA) 80  MG CP24 Take 80 mg by mouth daily.    Historical Provider, MD  Lurasidone HCl (LATUDA) 60 MG TABS Take 60 mg by mouth daily.    Historical Provider, MD  methocarbamol (ROBAXIN) 500 MG tablet Take 1 tablet (500 mg total) by mouth 4 (four) times daily. Patient taking differently: Take 500 mg by mouth 4 (four) times daily as needed for muscle spasms.  05/30/15   Bradly Bienenstock, MD  Multiple Vitamins-Minerals (MULTIVITAMIN GUMMIES ADULT) CHEW Chew 2 tablets by mouth daily.    Historical Provider, MD  OVER THE COUNTER MEDICATION Place 1 drop into both eyes every 4 (four) hours as needed (dry eyes). Over the counter lubricating eye drops    Historical Provider, MD  OVER THE COUNTER MEDICATION Place 1 drop into both eyes every 4 (four) hours as needed (dry eyes). Over the counter saline eye drops    Historical Provider, MD  Suvorexant (BELSOMRA) 10 MG TABS Take 10 mg by mouth at bedtime as needed (sleep).    Historical Provider, MD  temazepam (RESTORIL) 15 MG capsule Take 15 mg by mouth at bedtime as needed for sleep.  09/04/11   Historical Provider, MD   BP 128/84 mmHg  Pulse 97  Temp(Src) 99.1 F (37.3 C) (Oral)  Resp 16  Ht 5\' 10"  (1.778 m)  Wt 180 lb (81.647 kg)  BMI 25.83 kg/m2  SpO2 99% Physical Exam  Constitutional: He is oriented to person, place, and time. He has a sickly appearance. No distress.  HENT:  Head: Normocephalic and atraumatic.  Eyes: Conjunctivae and EOM are normal.  Cardiovascular: Normal rate and regular rhythm.   Pulmonary/Chest: Effort normal. No stridor. No respiratory distress.  Abdominal: He exhibits no distension.  Musculoskeletal: He exhibits no edema.       Arms: Neurological: He is alert and oriented to person, place, and time.  Skin: Skin is warm and dry.     Psychiatric: He has a normal mood and affect.  Nursing note and vitals reviewed.   ED Course  Procedures (including critical care time)  Imaging Review Dg Hip Unilat With Pelvis 2-3 Views  Left  09/14/2015  CLINICAL DATA:  Acute onset of left hip pain, status post fall outside on grass, and fall in bathtub. Initial encounter. EXAM: DG HIP (WITH OR WITHOUT PELVIS) 2-3V LEFT COMPARISON:  None. FINDINGS: There is no evidence of fracture or dislocation. Both femoral heads are seated normally within their respective acetabula. The proximal left femur appears intact. No significant degenerative change is appreciated. The sacroiliac joints are unremarkable in appearance. The visualized bowel gas pattern is grossly unremarkable in appearance. Scattered clips and tiny radiopaque fragments are  noted overlying the pelvis. IMPRESSION: No evidence of fracture or dislocation. Electronically Signed   By: Roanna Raider M.D.   On: 09/14/2015 05:50   I have personally reviewed and evaluated these images and lab results as part of my medical decision-making. On repeat exam the patient is in no distress. I discussed all findings with him and his wife. Patient will follow up with orthopedics.  MDM  Patient presents after a fall with pain persistently in his left hip. Notably, the patient has been ambulatory, is distally neurovascularly intact. Patient does have mild hematoma, but no hypotension, or substantial tachycardia suggesting substantial exsanguination. Patient discharged in stable condition with cryotherapy, analgesia to follow-up with orthopedics.  Gerhard Munch, MD 09/14/15 [id number]

## 2015-09-14 NOTE — ED Notes (Signed)
Patient transported to X-ray 

## 2015-09-19 ENCOUNTER — Encounter (HOSPITAL_COMMUNITY): Payer: Self-pay | Admitting: Emergency Medicine

## 2015-09-19 ENCOUNTER — Emergency Department (HOSPITAL_COMMUNITY)
Admission: EM | Admit: 2015-09-19 | Discharge: 2015-09-21 | Disposition: A | Discharge status: Home / Self Care | Payer: BC Managed Care – PPO | Attending: Emergency Medicine | Admitting: Emergency Medicine

## 2015-09-19 DIAGNOSIS — W1839XD Other fall on same level, subsequent encounter: Secondary | ICD-10-CM | POA: Diagnosis not present

## 2015-09-19 DIAGNOSIS — G4700 Insomnia, unspecified: Secondary | ICD-10-CM | POA: Insufficient documentation

## 2015-09-19 DIAGNOSIS — Z8781 Personal history of (healed) traumatic fracture: Secondary | ICD-10-CM | POA: Insufficient documentation

## 2015-09-19 DIAGNOSIS — F312 Bipolar disorder, current episode manic severe with psychotic features: Secondary | ICD-10-CM | POA: Insufficient documentation

## 2015-09-19 DIAGNOSIS — Z9119 Patient's noncompliance with other medical treatment and regimen: Secondary | ICD-10-CM | POA: Diagnosis not present

## 2015-09-19 DIAGNOSIS — E669 Obesity, unspecified: Secondary | ICD-10-CM | POA: Diagnosis not present

## 2015-09-19 DIAGNOSIS — F419 Anxiety disorder, unspecified: Secondary | ICD-10-CM | POA: Diagnosis not present

## 2015-09-19 DIAGNOSIS — Z87438 Personal history of other diseases of male genital organs: Secondary | ICD-10-CM | POA: Insufficient documentation

## 2015-09-19 DIAGNOSIS — Z79899 Other long term (current) drug therapy: Secondary | ICD-10-CM | POA: Insufficient documentation

## 2015-09-19 DIAGNOSIS — S300XXD Contusion of lower back and pelvis, subsequent encounter: Secondary | ICD-10-CM | POA: Diagnosis not present

## 2015-09-19 DIAGNOSIS — D649 Anemia, unspecified: Secondary | ICD-10-CM | POA: Diagnosis not present

## 2015-09-19 DIAGNOSIS — F17210 Nicotine dependence, cigarettes, uncomplicated: Secondary | ICD-10-CM | POA: Insufficient documentation

## 2015-09-19 DIAGNOSIS — Z8709 Personal history of other diseases of the respiratory system: Secondary | ICD-10-CM | POA: Insufficient documentation

## 2015-09-19 DIAGNOSIS — Z8701 Personal history of pneumonia (recurrent): Secondary | ICD-10-CM | POA: Diagnosis not present

## 2015-09-19 DIAGNOSIS — Z8719 Personal history of other diseases of the digestive system: Secondary | ICD-10-CM | POA: Insufficient documentation

## 2015-09-19 DIAGNOSIS — F309 Manic episode, unspecified: Secondary | ICD-10-CM | POA: Diagnosis present

## 2015-09-19 DIAGNOSIS — Z8679 Personal history of other diseases of the circulatory system: Secondary | ICD-10-CM | POA: Insufficient documentation

## 2015-09-19 DIAGNOSIS — Z8739 Personal history of other diseases of the musculoskeletal system and connective tissue: Secondary | ICD-10-CM | POA: Insufficient documentation

## 2015-09-19 DIAGNOSIS — S7012XD Contusion of left thigh, subsequent encounter: Secondary | ICD-10-CM | POA: Diagnosis not present

## 2015-09-19 DIAGNOSIS — F1210 Cannabis abuse, uncomplicated: Secondary | ICD-10-CM | POA: Diagnosis not present

## 2015-09-19 LAB — COMPREHENSIVE METABOLIC PANEL
ALT: 54 U/L (ref 17–63)
ANION GAP: 10 (ref 5–15)
AST: 58 U/L — ABNORMAL HIGH (ref 15–41)
Albumin: 3.4 g/dL — ABNORMAL LOW (ref 3.5–5.0)
Alkaline Phosphatase: 65 U/L (ref 38–126)
BILIRUBIN TOTAL: 1.2 mg/dL (ref 0.3–1.2)
BUN: 15 mg/dL (ref 6–20)
CO2: 25 mmol/L (ref 22–32)
Calcium: 8.5 mg/dL — ABNORMAL LOW (ref 8.9–10.3)
Chloride: 102 mmol/L (ref 101–111)
Creatinine, Ser: 1.14 mg/dL (ref 0.61–1.24)
Glucose, Bld: 98 mg/dL (ref 65–99)
POTASSIUM: 3.5 mmol/L (ref 3.5–5.1)
Sodium: 137 mmol/L (ref 135–145)
TOTAL PROTEIN: 6.1 g/dL — AB (ref 6.5–8.1)

## 2015-09-19 LAB — CBC
HCT: 30.6 % — ABNORMAL LOW (ref 39.0–52.0)
Hemoglobin: 10.2 g/dL — ABNORMAL LOW (ref 13.0–17.0)
MCH: 31.3 pg (ref 26.0–34.0)
MCHC: 33.3 g/dL (ref 30.0–36.0)
MCV: 93.9 fL (ref 78.0–100.0)
PLATELETS: 320 10*3/uL (ref 150–400)
RBC: 3.26 MIL/uL — ABNORMAL LOW (ref 4.22–5.81)
RDW: 15 % (ref 11.5–15.5)
WBC: 7.2 10*3/uL (ref 4.0–10.5)

## 2015-09-19 LAB — RAPID URINE DRUG SCREEN, HOSP PERFORMED
Amphetamines: NOT DETECTED
BENZODIAZEPINES: NOT DETECTED
Barbiturates: NOT DETECTED
Cocaine: NOT DETECTED
Opiates: NOT DETECTED
Tetrahydrocannabinol: POSITIVE — AB

## 2015-09-19 LAB — ACETAMINOPHEN LEVEL

## 2015-09-19 LAB — SALICYLATE LEVEL: Salicylate Lvl: <4 mg/dL (ref 2.8–30.0)

## 2015-09-19 LAB — ETHANOL

## 2015-09-19 MED ORDER — LURASIDONE HCL 60 MG PO TABS: 60.0000 mg | ORAL_TABLET | Freq: Every day | ORAL | Status: DC

## 2015-09-19 MED ORDER — NICOTINE 21 MG/24HR TD PT24
21.0000 mg | MEDICATED_PATCH | Freq: Every day | TRANSDERMAL | Status: DC
  Administered 2015-09-19 – 2015-09-21 (×3): 21 mg via TRANSDERMAL
  Filled 2015-09-19 (×3): qty 1

## 2015-09-19 MED ORDER — LORAZEPAM 2 MG/ML IJ SOLN
2.0000 mg | Freq: Once | INTRAMUSCULAR | Status: AC
  Administered 2015-09-19: 2 mg via INTRAVENOUS

## 2015-09-19 MED ORDER — ASENAPINE MALEATE 5 MG SL SUBL
10.0000 mg | SUBLINGUAL_TABLET | Freq: Every day | SUBLINGUAL | Status: DC | PRN
  Administered 2015-09-20 – 2015-09-21 (×2): 10 mg via SUBLINGUAL
  Filled 2015-09-19 (×3): qty 2

## 2015-09-19 MED ORDER — ZOLPIDEM TARTRATE 5 MG PO TABS
5.0000 mg | ORAL_TABLET | Freq: Every evening | ORAL | Status: DC | PRN
  Filled 2015-09-19: qty 1

## 2015-09-19 MED ORDER — CLONAZEPAM 0.5 MG PO TABS
1.0000 mg | ORAL_TABLET | Freq: Two times a day (BID) | ORAL | Status: DC | PRN
  Administered 2015-09-19 – 2015-09-20 (×3): 1 mg via ORAL
  Filled 2015-09-19 (×4): qty 2

## 2015-09-19 MED ORDER — LORAZEPAM 1 MG PO TABS
2.0000 mg | ORAL_TABLET | Freq: Once | ORAL | Status: DC
Start: 2015-09-19 — End: 2015-09-19

## 2015-09-19 MED ORDER — METHOCARBAMOL 500 MG PO TABS
500.0000 mg | ORAL_TABLET | Freq: Four times a day (QID) | ORAL | Status: DC
  Administered 2015-09-19 – 2015-09-20 (×4): 500 mg via ORAL
  Filled 2015-09-19 (×4): qty 1

## 2015-09-19 MED ORDER — LEVOMILNACIPRAN HCL ER 80 MG PO CP24: 80.0000 mg | ORAL_CAPSULE | Freq: Every day | ORAL | Status: DC

## 2015-09-19 MED ORDER — LORAZEPAM 2 MG/ML IJ SOLN
INTRAMUSCULAR | Status: AC
  Filled 2015-09-19: qty 1

## 2015-09-19 MED ORDER — LAMOTRIGINE 100 MG PO TABS
200.0000 mg | ORAL_TABLET | Freq: Every day | ORAL | Status: DC
  Administered 2015-09-19 – 2015-09-20 (×2): 200 mg via ORAL
  Filled 2015-09-19 (×2): qty 2

## 2015-09-19 MED ORDER — CALCIUM CARBONATE ANTACID 500 MG PO CHEW: 1.0000 | CHEWABLE_TABLET | Freq: Two times a day (BID) | ORAL | Status: DC | PRN

## 2015-09-19 NOTE — ED Provider Notes (Signed)
CSN: [id number]     Arrival date & time 09/19/15  1548 History   First MD Initiated Contact with Patient 09/19/15 1839     Chief Complaint  Patient presents with  . Manic Behavior    Level V caveat psychiatric disorder .history is obtained from patient's wife (Consider location/radiation/quality/duration/timing/severity/associated sxs/prior Treatment) HPI Patient has been increasingly agitated since the end of December 2016. He has been intermittently missing dosages of his medications. For the past 2 days his wife has been unable to control his behavior stating he's acting "manic" she brought him to the emergency department today and he left from the waiting room, police were sent to retrieve him. Patient denies any pain. When speaking with him he is agitated and speaks with delusions of grandeur, telling his wife "I'm magic. I can manipulate your thoughts. I can make things disappear." Past Medical History  Diagnosis Date  . Allergic rhinitis   . Anemia   . Arthritis   . Atypical chest pain   . Benign prostatic hypertrophy with urinary obstruction   . Bipolar disorder (HCC)   . Calculus of salivary duct   . Hypokalemia   . Insomnia   . Multiple vitamin deficiency   . Nicotine dependence   . Obesity   . Hard, firm prostate   . Sialoadenitis   . H/O: facial fractures 05/18/15  . Skull fracture with contusion (HCC) 05/18/15  . Rhabdomyolysis 05/18/15    after fall  . Prolonged QT interval   . OSA (obstructive sleep apnea)     does not use cpap  . Pneumonia   . Anxiety   . Depression    Past Surgical History  Procedure Laterality Date  . Gastric bypass  2006  . Appendectomy  1980s  . Biospy prostate    . Knee arthroscopy  1995  . Gastric bypass    . Rhinologic surgery    . Tonsillectomy and adenoidectomy    . Uvuloplasty    . Colonoscopy    . Orif wrist fracture Right 05/30/2015    Procedure: OPEN REDUCTION INTERNAL FIXATION (ORIF) RIGHT WRIST FRACTURE;  Surgeon: Bradly BienenstockFred  Ortmann, MD;  Location: [facility] OR;  Service: Orthopedics;  Laterality: Right;   Family History  Problem Relation Age of Onset  . Heart attack Maternal Grandfather   . Hyperlipidemia Mother   . Macular degeneration Father    Social History  Substance Use Topics  . Smoking status: Current Every Day Smoker -- 0.00 packs/day for 29 years    Types: E-cigarettes, Cigarettes  . Smokeless tobacco: Never Used     Comment: 11/03/11 smoking 15 cigs daily  . Alcohol Use: Yes     Comment: hx of "dependency" on alcohol   positive marijuana use  Review of Systems  Unable to perform ROS: Psychiatric disorder  Skin: Positive for wound.       Contusion on left buttock from fall 09/14/2015, for which she was evaluated here      Allergies  Review of patient's allergies indicates no known allergies.  Home Medications   Prior to Admission medications   Medication Sig Start Date End Date Taking? Authorizing Provider  acetaminophen (TYLENOL) 500 MG tablet Take 500-1,000 mg by mouth every 6 (six) hours as needed (pain).    Historical Provider, MD  asenapine (SAPHRIS) 5 MG SUBL 24 hr tablet Place 10 mg under the tongue daily.    Historical Provider, MD  clonazePAM (KLONOPIN) 1 MG tablet Take 1 mg by mouth daily as  needed for anxiety.    Historical Provider, MD  Eszopiclone (ESZOPICLONE) 3 MG TABS Take 3 mg by mouth at bedtime as needed (sleep). Take immediately before bedtime (Lunesta)    Historical Provider, MD  lamoTRIgine (LAMICTAL) 200 MG tablet Take 200 mg by mouth at bedtime. 05/26/15   Historical Provider, MD  Levomilnacipran HCl ER (FETZIMA) 80 MG CP24 Take 80 mg by mouth daily.    Historical Provider, MD  Lurasidone HCl (LATUDA) 60 MG TABS Take 60 mg by mouth daily.    Historical Provider, MD  methocarbamol (ROBAXIN) 500 MG tablet Take 1 tablet (500 mg total) by mouth 4 (four) times daily. Patient taking differently: Take 500 mg by mouth 4 (four) times daily as needed for muscle spasms.  05/30/15    Bradly Bienenstock, MD  Multiple Vitamins-Minerals (MULTIVITAMIN GUMMIES ADULT) CHEW Chew 2 tablets by mouth daily.    Historical Provider, MD  OVER THE COUNTER MEDICATION Place 1 drop into both eyes every 4 (four) hours as needed (dry eyes). Over the counter lubricating eye drops    Historical Provider, MD  OVER THE COUNTER MEDICATION Place 1 drop into both eyes every 4 (four) hours as needed (dry eyes). Over the counter saline eye drops    Historical Provider, MD  Suvorexant (BELSOMRA) 10 MG TABS Take 10 mg by mouth at bedtime as needed (sleep).    Historical Provider, MD  temazepam (RESTORIL) 15 MG capsule Take 15 mg by mouth at bedtime as needed for sleep.  09/04/11   Historical Provider, MD   BP 149/100 mmHg  Pulse 100  Temp(Src) 98 F (36.7 C) (Oral)  Resp 16  Ht 5\' 9"  (1.753 m)  Wt 180 lb (81.647 kg)  BMI 26.57 kg/m2  SpO2 100% Physical Exam  Constitutional:  Unkempt  HENT:  Head: Normocephalic and atraumatic.  No facial asymmetry  Eyes: Conjunctivae are normal. Pupils are equal, round, and reactive to light.  Neck: Neck supple. No tracheal deviation present. No thyromegaly present.  Cardiovascular: Normal rate and regular rhythm.   No murmur heard. Pulmonary/Chest: Effort normal and breath sounds normal.  Abdominal: Soft. Bowel sounds are normal. He exhibits no distension. There is no tenderness.  Musculoskeletal: Normal range of motion. He exhibits no edema or tenderness.  Entire spine nontender. Pelvis stable and nontender. All 4 extremities without deformity or tenderness neurovascular intact  Neurological: He is alert. No cranial nerve deficit. Coordination normal.  Skin: Skin is warm and dry. No rash noted.  Ecchymotic over left buttock and left lateral thigh.  Psychiatric:  Agitated, attempting to climb off the stretcher.  Nursing note and vitals reviewed.   ED Course  Procedures (including critical care time) Labs Review Labs Reviewed  COMPREHENSIVE METABOLIC PANEL  - Abnormal; Notable for the following:    Calcium 8.5 (*)    Total Protein 6.1 (*)    Albumin 3.4 (*)    AST 58 (*)    All other components within normal limits  ACETAMINOPHEN LEVEL - Abnormal; Notable for the following:    Acetaminophen (Tylenol), Serum <10 (*)    All other components within normal limits  CBC - Abnormal; Notable for the following:    RBC 3.26 (*)    Hemoglobin 10.2 (*)    HCT 30.6 (*)    All other components within normal limits  ETHANOL  SALICYLATE LEVEL  URINE RAPID DRUG SCREEN, HOSP PERFORMED    Imaging Review No results found. I have personally reviewed and evaluated these images and  lab results as part of my medical decision-making.   EKG Interpretation   Date/Time:  Wednesday September 19 2015 19:08:42 EST Ventricular Rate:  93 PR Interval:  156 QRS Duration: 112 QT Interval:  406 QTC Calculation: 505 R Axis:   73 Text Interpretation:  Sinus rhythm Atrial premature complex Incomplete  right bundle branch block Borderline low voltage, extremity leads  Prolonged QT interval No significant change since last tracing Confirmed  by Ethelda Chick  MD, Kadiatou Oplinger [phone]) on 09/19/2015 7:33:28 PM     7:30 PM patient is sleeping after treatment with Ativan 2 mg IV Results for orders placed or performed during the hospital encounter of 09/19/15  Comprehensive metabolic panel  Result Value Ref Range   Sodium 137 135 - 145 mmol/L   Potassium 3.5 3.5 - 5.1 mmol/L   Chloride 102 101 - 111 mmol/L   CO2 25 22 - 32 mmol/L   Glucose, Bld 98 65 - 99 mg/dL   BUN 15 6 - 20 mg/dL   Creatinine, Ser 9.14 0.61 - 1.24 mg/dL   Calcium 8.5 (L) 8.9 - 10.3 mg/dL   Total Protein 6.1 (L) 6.5 - 8.1 g/dL   Albumin 3.4 (L) 3.5 - 5.0 g/dL   AST 58 (H) 15 - 41 U/L   ALT 54 17 - 63 U/L   Alkaline Phosphatase 65 38 - 126 U/L   Total Bilirubin 1.2 0.3 - 1.2 mg/dL   GFR calc non Af Amer >60 >60 mL/min   GFR calc Af Amer >60 >60 mL/min   Anion gap 10 5 - 15  Ethanol (ETOH)  Result Value  Ref Range   Alcohol, Ethyl (B) <5 <5 mg/dL  Salicylate level  Result Value Ref Range   Salicylate Lvl <4.0 2.8 - 30.0 mg/dL  Acetaminophen level  Result Value Ref Range   Acetaminophen (Tylenol), Serum <10 (L) 10 - 30 ug/mL  CBC  Result Value Ref Range   WBC 7.2 4.0 - 10.5 K/uL   RBC 3.26 (L) 4.22 - 5.81 MIL/uL   Hemoglobin 10.2 (L) 13.0 - 17.0 g/dL   HCT 78.2 (L) 95.6 - 21.3 %   MCV 93.9 78.0 - 100.0 fL   MCH 31.3 26.0 - 34.0 pg   MCHC 33.3 30.0 - 36.0 g/dL   RDW 08.6 57.8 - 46.9 %   Platelets 320 150 - 400 K/uL  Urine rapid drug screen (hosp performed) (Not at [facility])  Result Value Ref Range   Opiates NONE DETECTED NONE DETECTED   Cocaine NONE DETECTED NONE DETECTED   Benzodiazepines NONE DETECTED NONE DETECTED   Amphetamines NONE DETECTED NONE DETECTED   Tetrahydrocannabinol POSITIVE (A) NONE DETECTED   Barbiturates NONE DETECTED NONE DETECTED   Dg Chest 2 View  09/01/2015  CLINICAL DATA:  Altered mental status. EXAM: CHEST  2 VIEW COMPARISON:  None. FINDINGS: The heart size and mediastinal contours are within normal limits. The lungs are hyperinflated. There is no focal infiltrate, pulmonary edema, or pleural effusion. Degenerative joint changes of the spine are noted. IMPRESSION: No active cardiopulmonary disease.  Emphysema. Electronically Signed   By: Sherian Rein M.D.   On: 09/01/2015 08:37   Ct Head Wo Contrast  09/01/2015  CLINICAL DATA:  Altered mental status EXAM: CT HEAD WITHOUT CONTRAST TECHNIQUE: Contiguous axial images were obtained from the base of the skull through the vertex without contrast. COMPARISON:  08/31/2015 FINDINGS: No acute intracranial hemorrhage, mass lesion, definite infarction, midline shift, herniation, hydrocephalus, or extra-axial fluid collection. No focal mass  effect or edema. Ventricles are symmetric. Cisterns are patent. No cerebellar abnormality. Orbits are symmetric. Mastoids remain clear. Minor scattered sinus disease and retention cyst in  the left maxillary sinus. IMPRESSION: Stable head CT without acute intracranial abnormality or interval change. Electronically Signed   By: Judie Petit.  Shick M.D.   On: 09/01/2015 10:46   Ct Head Wo Contrast  08/31/2015  CLINICAL DATA:  Altered mental status/confusion EXAM: CT HEAD WITHOUT CONTRAST TECHNIQUE: Contiguous axial images were obtained from the base of the skull through the vertex without intravenous contrast. COMPARISON:  None. FINDINGS: The ventricles are normal in size and configuration. There is a minimal cavum septum pellucidum, an anatomic variant. There is no intracranial mass, hemorrhage, extra-axial fluid collection, or midline shift. There is minimal small vessel disease in the centra semiovale bilaterally. Mild basal ganglia calcification bilaterally is felt to most likely be physiologic. Elsewhere gray-white compartments are normal. No acute infarct is evident. There is evidence of a prior fracture of the superior orbital rim on the left. Alignment in this area is anatomic. Elsewhere bony calvarium appears intact. Visualized mastoid air cells are clear. There are retention cysts in the left maxillary antrum. There also areas of opacification in several ethmoid air cells bilaterally. No intraorbital lesions are identified. IMPRESSION: Slight periventricular small vessel disease. No intracranial mass, hemorrhage, or acute infarct. Prior fracture, nondisplaced, along the superior left orbital rim/inferior left frontal bone. No intraorbital lesion appreciable. Small retention cysts noted in the left maxillary antrum. Patchy ethmoid sinus disease bilaterally. Electronically Signed   By: Bretta Bang III M.D.   On: 08/31/2015 08:41   Dg Hip Unilat With Pelvis 2-3 Views Left  09/14/2015  CLINICAL DATA:  Acute onset of left hip pain, status post fall outside on grass, and fall in bathtub. Initial encounter. EXAM: DG HIP (WITH OR WITHOUT PELVIS) 2-3V LEFT COMPARISON:  None. FINDINGS: There is no  evidence of fracture or dislocation. Both femoral heads are seated normally within their respective acetabula. The proximal left femur appears intact. No significant degenerative change is appreciated. The sacroiliac joints are unremarkable in appearance. The visualized bowel gas pattern is grossly unremarkable in appearance. Scattered clips and tiny radiopaque fragments are noted overlying the pelvis. IMPRESSION: No evidence of fracture or dislocation. Electronically Signed   By: Roanna Raider M.D.   On: 09/14/2015 05:50    MDM  Patient involuntarily committed by me for psychiatric evaluation. First exam form and IVC form filled out by me. She is felt to be a danger to himself and unable to care for himself and his wife is unable to care for him. He's been noncompliant with his psychiatric medications per his wife Final diagnoses:  None   Anemia is chronic. TTS consulted and deemed pt to require inpatient psychiatric bed. Dx #1 bipolar disorder, manic phase #2 anemia #57medication non compliance CRITICAL CARE Performed by: Doug Sou Total critical care time: 30 minutes Critical care time was exclusive of separately billable procedures and treating other patients. Critical care was necessary to treat or prevent imminent or life-threatening deterioration. Critical care was time spent personally by me on the following activities: development of treatment plan with patient and/or surrogate as well as nursing, discussions with consultants, evaluation of patient's response to treatment, examination of patient, obtaining history from patient or surrogate, ordering and performing treatments and interventions, ordering and review of laboratory studies, ordering and review of radiographic studies, pulse oximetry and re-evaluation of patient's condition.   Doug Sou, MD 09/20/15 [phone]

## 2015-09-19 NOTE — BH Assessment (Addendum)
Tele Assessment Note   Alvin DakinCharles Allen is an 55 y.o. male.  -Clinician reviewed note by Dr. Ethelda ChickJacubowitz.  Patient has been up for the last 6 days.  Is manic, cannot focus.  Tried to leave the ED earlier and had to be retriieved.  Dr. Ethelda ChickJacubowitz placed patient on IVC.  Patient is accompanied by wife and gives permission for her to participate.  Patient is manic.  He is up and down frequently throughout the assessment.  Patient can answer questions but he will frequently give long rambling answers and wife has to intercede to provide concise information.  Patient does not have any SI, HI or A/V hallucinations.  Wife said that he did tell children "I'm going to the hospital so I don't kill myself."  Patient denies plan or intention.  Per patient and wife, patient has not slept in almost 6 days.  He has eaten little, will eat a few bites then put food down.  Patient cannot focus well and will wander.  He will say non-sensical things like "I can manipulate other people's thoughts."  Mostly patient will ramble.  Patient admits to drinking a few beers every 3-4 days.  Last drink was a few days ago wife said.  Patient admits to smoking marijuana "once in a blue moon" but said he did not like it when he tried it again recently.  Patient cannot remember last use.    -Clinician reviewed patient with Donell SievertSpencer Simon, PA who recommends inpatient treatment.  Patient will need to be referred out to other facilities since there are no male 500 hall beds at this time.  Clinician informed nurse Earl LitesGregory about disposition.  Diagnosis: Bipolar I d/o  Past Medical History:  Past Medical History  Diagnosis Date  . Allergic rhinitis   . Anemia   . Arthritis   . Atypical chest pain   . Benign prostatic hypertrophy with urinary obstruction   . Bipolar disorder (HCC)   . Calculus of salivary duct   . Hypokalemia   . Insomnia   . Multiple vitamin deficiency   . Nicotine dependence   . Obesity   . Hard, firm prostate    . Sialoadenitis   . H/O: facial fractures 05/18/15  . Skull fracture with contusion (HCC) 05/18/15  . Rhabdomyolysis 05/18/15    after fall  . Prolonged QT interval   . OSA (obstructive sleep apnea)     does not use cpap  . Pneumonia   . Anxiety   . Depression     Past Surgical History  Procedure Laterality Date  . Gastric bypass  2006  . Appendectomy  1980s  . Biospy prostate    . Knee arthroscopy  1995  . Gastric bypass    . Rhinologic surgery    . Tonsillectomy and adenoidectomy    . Uvuloplasty    . Colonoscopy    . Orif wrist fracture Right 05/30/2015    Procedure: OPEN REDUCTION INTERNAL FIXATION (ORIF) RIGHT WRIST FRACTURE;  Surgeon: Bradly BienenstockFred Ortmann, MD;  Location: [facility] OR;  Service: Orthopedics;  Laterality: Right;    Family History:  Family History  Problem Relation Age of Onset  . Heart attack Maternal Grandfather   . Hyperlipidemia Mother   . Macular degeneration Father     Social History:  reports that he has been smoking E-cigarettes and Cigarettes.  He has been smoking about 0.00 packs per day for the past 29 years. He has never used smokeless tobacco. He reports that he drinks alcohol.  He reports that he does not use illicit drugs.  Additional Social History:  Alcohol / Drug Use Pain Medications: None Prescriptions: Off Lamictal for a week w/o replacement.  Pt says that he is taking his meds but told EDP that he has not been taking them. Over the Counter: Tums, tylenol Substance #1 Name of Substance 1: ETOH 1 - Age of First Use: 55 years of age 63 - Amount (size/oz): 1 beer every 3-4 days 1 - Frequency: Every few days 1 - Duration: Ongoing 1 - Last Use / Amount: A few days ago.  CIWA: CIWA-Ar BP: 149/100 mmHg Pulse Rate: 100 COWS:    PATIENT STRENGTHS: (choose at least two) Average or above average intelligence Communication skills General fund of knowledge Supportive family/friends Work skills  Allergies: No Known Allergies  Home Medications:   (Not in a hospital admission)  OB/GYN Status:  No LMP for male patient.  General Assessment Data Location of Assessment: [facility] ED TTS Assessment: In system Is this a Tele or Face-to-Face Assessment?: Tele Assessment Is this an Initial Assessment or a Re-assessment for this encounter?: Initial Assessment Marital status: Married Is patient pregnant?: No Pregnancy Status: No Living Arrangements: Spouse/significant other, Children (Wife, 79 yr old son and 58 yr old fem transitioning to male) Can pt return to current living arrangement?: Yes Admission Status: Involuntary Is patient capable of signing voluntary admission?: No Referral Source: Self/Family/Friend Insurance type: BC/BS     Crisis Care Plan Living Arrangements: Spouse/significant other, Children (Wife, 41 yr old son and 56 yr old fem transitioning to male) Name of Psychiatrist: Dr Archer Asa Name of Therapist: None  (Has 10/03/15 appt with [facility])  Education Status Is patient currently in school?: No Highest grade of school patient has completed: 12th grade  Risk to self with the past 6 months Suicidal Ideation: No Has patient been a risk to self within the past 6 months prior to admission? : No Suicidal Intent: No Has patient had any suicidal intent within the past 6 months prior to admission? : No Is patient at risk for suicide?: No Suicidal Plan?: No Has patient had any suicidal plan within the past 6 months prior to admission? : No Access to Means: No What has been your use of drugs/alcohol within the last 12 months?: Some ETOH Previous Attempts/Gestures: No How many times?: 0 Other Self Harm Risks: None Triggers for Past Attempts: None known Intentional Self Injurious Behavior: None Family Suicide History: No Recent stressful life event(s): Other (Comment) (Recent medication change) Persecutory voices/beliefs?: No Depression: Yes Depression Symptoms: Despondent, Feeling  worthless/self pity, Loss of interest in usual pleasures, Insomnia Substance abuse history and/or treatment for substance abuse?: No (Mentioned some THC use in the past.) Suicide prevention information given to non-admitted patients: Not applicable  Risk to Others within the past 6 months Homicidal Ideation: No Does patient have any lifetime risk of violence toward others beyond the six months prior to admission? : No Thoughts of Harm to Others: No Current Homicidal Intent: No Current Homicidal Plan: No Access to Homicidal Means: No Identified Victim: None History of harm to others?: No Assessment of Violence: None Noted Violent Behavior Description: Pt denies Does patient have access to weapons?: Yes (Comment) (Stun gun at home.) Criminal Charges Pending?: No Does patient have a court date: No Is patient on probation?: No  Psychosis Hallucinations: None noted Delusions: Grandiose (manipulating people's thoughts.)  Mental Status Report Appearance/Hygiene: Disheveled, In scrubs Eye Contact: Poor Motor  Activity: Agitation, Restlessness, Hyperactivity Speech: Logical/coherent, Word salad, Slurred Level of Consciousness: Alert, Restless Mood: Anxious, Despair, Helpless, Preoccupied, Worthless, low self-esteem Affect: Anxious, Depressed, Sad Anxiety Level: Panic Attacks Panic attack frequency: Two in the last two weeks Most recent panic attack: Can't recall Thought Processes: Irrelevant, Tangential, Flight of Ideas Judgement: Unimpaired Orientation: Person, Place, Time, Situation Obsessive Compulsive Thoughts/Behaviors: Moderate  Cognitive Functioning Concentration: Poor Memory: Recent Impaired, Remote Impaired IQ: Average Insight: Poor Impulse Control: Poor Appetite: Poor Weight Loss:  (Will eat one or two bites.  Taking Tums) Weight Gain: 0 Sleep: Decreased Total Hours of Sleep:  (Hardly any sleep in last 6 days.) Vegetative Symptoms: None  ADLScreening [facility]  Assessment Services) Patient's cognitive ability adequate to safely complete daily activities?: Yes Patient able to express need for assistance with ADLs?: Yes Independently performs ADLs?: Yes (appropriate for developmental age)  Prior Inpatient Therapy Prior Inpatient Therapy: Yes Prior Therapy Dates: 2011, other years prior Prior Therapy Facilty/Provider(s): [facility], [facility], [facility] Reason for Treatment: psychosis  Prior Outpatient Therapy Prior Outpatient Therapy: Yes Prior Therapy Dates: currently and in the past Prior Therapy Facilty/Provider(s): Dr Donell Beers and others Reason for Treatment: bipolar d/o Does patient have an ACCT team?: No Does patient have Intensive In-House Services?  : No Does patient have [facility] services? : No Does patient have P4CC services?: No  ADL Screening (condition at time of admission) Patient's cognitive ability adequate to safely complete daily activities?: Yes Is the patient deaf or have difficulty hearing?: No Does the patient have difficulty seeing, even when wearing glasses/contacts?: Yes (Some damage to optic nerve in concussion on 05-17-15.) Does the patient have difficulty concentrating, remembering, or making decisions?: Yes Patient able to express need for assistance with ADLs?: Yes Does the patient have difficulty dressing or bathing?: Yes Independently performs ADLs?: Yes (appropriate for developmental age) Does the patient have difficulty walking or climbing stairs?: No Weakness of Legs: None Weakness of Arms/Hands: None       Abuse/Neglect Assessment (Assessment to be complete while patient is alone) Physical Abuse: Denies Verbal Abuse: Denies Sexual Abuse: Denies Exploitation of patient/patient's resources: Denies Self-Neglect: Denies     Merchant navy officer (For Healthcare) Does patient have an advance directive?: Yes Type of Advance Directive: Healthcare Power of Attorney, Living will Does patient want to make changes to advanced  directive?: No - Patient declined (Wife said that she has both and will bring in.)    Additional Information 1:1 In Past 12 Months?: No CIRT Risk: No Elopement Risk: Yes Does patient have medical clearance?: Yes     Disposition:  Disposition Initial Assessment Completed for this Encounter: Yes Disposition of Patient: Inpatient treatment program Type of inpatient treatment program: Adult (Clinician to review with PA)  Beatriz Stallion Ray 09/19/2015 10:48 PM

## 2015-09-19 NOTE — ED Notes (Signed)
Per wife, pt is no longer on Lurasidone, spoke to Provider who gave a verbal order to DC.  Spoke to pod C RN, pt can move to room 20. Per staffing, sitter coming at [employer]11pm, Consulting civil engineerCharge RN notified.

## 2015-09-19 NOTE — ED Notes (Signed)
Patient's wife took one patient belongings bag off all patient's belongings into car.

## 2015-09-19 NOTE — ED Notes (Signed)
Pt states he has not slept in 6 days and just wants to get some sleep. In triage pt is very hyper active and laughing and crying. Pt denies having any thoughts or harming himself or others.

## 2015-09-19 NOTE — ED Notes (Signed)
Called TTS states will have consult in approximately 10 minutes.

## 2015-09-19 NOTE — ED Notes (Signed)
TTS being completed in patient's room with wife at bedside.

## 2015-09-19 NOTE — ED Notes (Signed)
Sheriff arrived to serve IVC paperwork.

## 2015-09-19 NOTE — ED Notes (Signed)
Patient continue to speak to TTS with wife at bedside.

## 2015-09-19 NOTE — ED Notes (Signed)
TTS completed. 

## 2015-09-19 NOTE — ED Notes (Signed)
Patient, wife, and TTS continue to have consult in patient's room.

## 2015-09-19 NOTE — ED Notes (Signed)
Patient ambulating in room and hallway returned to room when asked to.

## 2015-09-20 MED ORDER — TRAZODONE HCL 50 MG PO TABS
50.0000 mg | ORAL_TABLET | Freq: Every day | ORAL | Status: DC
  Administered 2015-09-20: 50 mg via ORAL
  Filled 2015-09-20: qty 1

## 2015-09-20 MED ORDER — LORAZEPAM 2 MG/ML IJ SOLN
2.0000 mg | Freq: Once | INTRAMUSCULAR | Status: AC
  Administered 2015-09-20: 2 mg via INTRAVENOUS
  Filled 2015-09-20: qty 1

## 2015-09-20 MED ORDER — OLANZAPINE 5 MG PO TBDP
5.0000 mg | ORAL_TABLET | Freq: Three times a day (TID) | ORAL | Status: DC | PRN
  Administered 2015-09-21: 5 mg via ORAL
  Filled 2015-09-20: qty 1

## 2015-09-20 MED ORDER — TEMAZEPAM 15 MG PO CAPS
15.0000 mg | ORAL_CAPSULE | Freq: Every day | ORAL | Status: DC
  Administered 2015-09-20: 15 mg via ORAL
  Filled 2015-09-20: qty 1

## 2015-09-20 MED ORDER — LORAZEPAM 1 MG PO TABS
1.0000 mg | ORAL_TABLET | Freq: Once | ORAL | Status: AC
  Administered 2015-09-20: 1 mg via ORAL
  Filled 2015-09-20: qty 1

## 2015-09-20 NOTE — ED Notes (Signed)
Pt ambulated to RR5.  Pt exhibited balance issues coming and going.  Sitter had to assist pt three times.

## 2015-09-20 NOTE — ED Notes (Signed)
Patient was given a snack and drink, A regular diet was ordered for lunch. 

## 2015-09-20 NOTE — ED Notes (Signed)
Patient is now awake, talking accessibly, stating that he got a couple hours of sleep and feels rejuvenated. Patient states he does not want to go back to bed. Will speak to MD regarding orders to help patient sleep.

## 2015-09-20 NOTE — ED Notes (Signed)
Pt given drink for snack and requested Rice Krispy treat.  Ate and drank 100%.

## 2015-09-20 NOTE — ED Notes (Signed)
Pt has urinated on bathroom floor x 2. Pt denies urinating on floor, yelled down the hall at staff "I did not fucking pee on the floor -- you did!!" pt ambulated back to room with sitter.

## 2015-09-20 NOTE — ED Notes (Signed)
Family at bedside-- pt crying loudly, but consolable

## 2015-09-20 NOTE — ED Notes (Addendum)
Pt is sleeping at present. SPoke with Renata Capriceonrad, [facility] for [facility]. Given to Dr. Effie ShyWentz.

## 2015-09-21 NOTE — ED Provider Notes (Signed)
Filed Vitals:   09/21/15 0519 09/21/15 1004  BP: 125/80 124/84  Pulse: 80 64  Temp: 98.5 F (36.9 C) 98.3 F (36.8 C)  Resp: 20 16    The patient accepted to [facility] by Dr. Les Pouarlton. On evaluation of patient, he is very tearful after watching a episode on television. States that it was a very good TV show. Still showing signs of mania. Hemodynamically stable and otherwise medically cleared for discharge for inpatient psychiatric evaluation.   Lavera Guiseana Duo Melenda Bielak, MD 09/21/15 [phone]

## 2015-09-21 NOTE — BHH Counselor (Signed)
TTS Counselor faxed referrals to the following facilities in an effort to obtain inpt placement for pt:  [facility]   - [facility] [place] [place]   Therapeutic Triage    09/21/15   02:45

## 2015-09-21 NOTE — ED Notes (Signed)
Old bruise to left hip and thigh area.

## 2015-09-21 NOTE — ED Notes (Signed)
[facility] given to [facility] Charity fundraiserN at [facility] [place] Sheriff's dept notified for transfer.

## 2015-09-21 NOTE — ED Notes (Signed)
Pt accepted to [facility].  Sheriff Research officer, political party(Sgt Pascal) called for pickup and transportation.

## 2015-09-21 NOTE — ED Notes (Signed)
Sitter called me to c20.  Pt had locked himself into RR6 and would not open the door nor unlock.  When he emerged from the restroom he strongly and loudly stated, "I'm done!  I am not going to be compliant any more!  I don't care what you say, I'm calling my lawyer and filing a lawsuit!  Shut the fuck up!"  I am going to lock the door no matter what you say!"  I replied, "Then we can bring you a urinal," and immediately informed the RN of his comments.  She agreed that he would be using a urinal, and went to discuss the event with the pt.

## 2015-09-21 NOTE — ED Notes (Addendum)
Bed linens were changed while pt was showering.

## 2015-09-21 NOTE — ED Notes (Signed)
[facility] Dr. Jannifer FranklinAkintayo .

## 2015-09-21 NOTE — ED Notes (Signed)
Patient very anxious. Patient making statements "this is my earth. You leave if you don't like it." Paranoid. Reporting that he will not be controlled by a male. Security called. At room at this time.

## 2015-09-21 NOTE — ED Notes (Signed)
Alvin Allen, [facility], called RN requesting information regarding health history, ADLs and medications.

## 2015-09-21 NOTE — BH Assessment (Signed)
Spoke with [facility] at [facility] who reported that pt has been accepted by Dr. Les Pouarlton. Pt can be transported after 9 am. Nurse reported # [phone]. Informed Dr. Wilkie AyeHorton of disposition.

## 2015-12-11 ENCOUNTER — Encounter (HOSPITAL_COMMUNITY): Payer: Self-pay | Admitting: Emergency Medicine

## 2015-12-11 ENCOUNTER — Emergency Department (HOSPITAL_COMMUNITY)
Admission: EM | Admit: 2015-12-11 | Discharge: 2015-12-12 | Disposition: A | Discharge status: Home / Self Care | Payer: BC Managed Care – PPO | Source: Home / Self Care

## 2015-12-11 DIAGNOSIS — R4182 Altered mental status, unspecified: Secondary | ICD-10-CM | POA: Insufficient documentation

## 2015-12-11 DIAGNOSIS — Z8701 Personal history of pneumonia (recurrent): Secondary | ICD-10-CM | POA: Insufficient documentation

## 2015-12-11 DIAGNOSIS — F419 Anxiety disorder, unspecified: Secondary | ICD-10-CM | POA: Insufficient documentation

## 2015-12-11 DIAGNOSIS — E876 Hypokalemia: Secondary | ICD-10-CM | POA: Insufficient documentation

## 2015-12-11 DIAGNOSIS — G4700 Insomnia, unspecified: Secondary | ICD-10-CM | POA: Insufficient documentation

## 2015-12-11 DIAGNOSIS — F29 Unspecified psychosis not due to a substance or known physiological condition: Secondary | ICD-10-CM | POA: Insufficient documentation

## 2015-12-11 DIAGNOSIS — Z862 Personal history of diseases of the blood and blood-forming organs and certain disorders involving the immune mechanism: Secondary | ICD-10-CM | POA: Insufficient documentation

## 2015-12-11 DIAGNOSIS — F17210 Nicotine dependence, cigarettes, uncomplicated: Secondary | ICD-10-CM | POA: Insufficient documentation

## 2015-12-11 DIAGNOSIS — E669 Obesity, unspecified: Secondary | ICD-10-CM | POA: Insufficient documentation

## 2015-12-11 DIAGNOSIS — K0381 Cracked tooth: Secondary | ICD-10-CM | POA: Diagnosis not present

## 2015-12-11 DIAGNOSIS — Z79899 Other long term (current) drug therapy: Secondary | ICD-10-CM | POA: Diagnosis not present

## 2015-12-11 DIAGNOSIS — R410 Disorientation, unspecified: Secondary | ICD-10-CM | POA: Diagnosis present

## 2015-12-11 DIAGNOSIS — Z87438 Personal history of other diseases of male genital organs: Secondary | ICD-10-CM | POA: Diagnosis not present

## 2015-12-11 DIAGNOSIS — F319 Bipolar disorder, unspecified: Secondary | ICD-10-CM | POA: Insufficient documentation

## 2015-12-11 DIAGNOSIS — Z8781 Personal history of (healed) traumatic fracture: Secondary | ICD-10-CM | POA: Insufficient documentation

## 2015-12-11 DIAGNOSIS — F3112 Bipolar disorder, current episode manic without psychotic features, moderate: Secondary | ICD-10-CM | POA: Diagnosis not present

## 2015-12-11 DIAGNOSIS — Z8739 Personal history of other diseases of the musculoskeletal system and connective tissue: Secondary | ICD-10-CM | POA: Diagnosis not present

## 2015-12-11 DIAGNOSIS — R600 Localized edema: Secondary | ICD-10-CM | POA: Diagnosis not present

## 2015-12-11 LAB — COMPREHENSIVE METABOLIC PANEL
ALT: 21 U/L (ref 17–63)
ANION GAP: 9 (ref 5–15)
AST: 20 U/L (ref 15–41)
Albumin: 2.5 g/dL — ABNORMAL LOW (ref 3.5–5.0)
Alkaline Phosphatase: 55 U/L (ref 38–126)
BUN: 12 mg/dL (ref 6–20)
CHLORIDE: 109 mmol/L (ref 101–111)
CO2: 26 mmol/L (ref 22–32)
Calcium: 8.1 mg/dL — ABNORMAL LOW (ref 8.9–10.3)
Creatinine, Ser: 0.84 mg/dL (ref 0.61–1.24)
GFR calc Af Amer: >60 mL/min (ref >60)
GFR calc non Af Amer: >60 mL/min (ref >60)
GLUCOSE: 110 mg/dL — AB (ref 65–99)
POTASSIUM: 3.5 mmol/L (ref 3.5–5.1)
SODIUM: 144 mmol/L (ref 135–145)
TOTAL PROTEIN: 5.6 g/dL — AB (ref 6.5–8.1)
Total Bilirubin: 0.5 mg/dL (ref 0.3–1.2)

## 2015-12-11 LAB — ETHANOL: Alcohol, Ethyl (B): <5 mg/dL (ref <5)

## 2015-12-11 LAB — CBC
HEMATOCRIT: 27.2 % — AB (ref 39.0–52.0)
HEMOGLOBIN: 8.4 g/dL — AB (ref 13.0–17.0)
MCH: 29.8 pg (ref 26.0–34.0)
MCHC: 30.9 g/dL (ref 30.0–36.0)
MCV: 96.5 fL (ref 78.0–100.0)
Platelets: 300 10*3/uL (ref 150–400)
RBC: 2.82 MIL/uL — ABNORMAL LOW (ref 4.22–5.81)
RDW: 14.8 % (ref 11.5–15.5)
WBC: 6.2 10*3/uL (ref 4.0–10.5)

## 2015-12-11 LAB — SALICYLATE LEVEL: Salicylate Lvl: <4 mg/dL (ref 2.8–30.0)

## 2015-12-11 LAB — ACETAMINOPHEN LEVEL: Acetaminophen (Tylenol), Serum: <10 ug/mL — ABNORMAL LOW (ref 10–30)

## 2015-12-11 NOTE — ED Notes (Signed)
Patient was getting ready to be pushed to triage.   Patient states "you can't push me back until I get a cigarette and attempted to take his cigarettes out".  I advised no smoking in the ED, patient jumped out of chair and ran outside cursing.  Security out to speak with patient.

## 2015-12-11 NOTE — ED Notes (Signed)
Pt up to Nurse 1st station conversing with staff. With appropriate conversation. Pt proceeded out of the building to 'go smoke'. Pt ambulatory without any difficulty

## 2015-12-11 NOTE — ED Notes (Addendum)
Pt sts here with family c/o possible mis taking his psych meds; pt confused more than normal early this am; [facility]; pt smells of urine; per family pt may have mistaken meds and taken whole days meds this am

## 2015-12-12 ENCOUNTER — Encounter (HOSPITAL_COMMUNITY): Payer: Self-pay

## 2015-12-12 ENCOUNTER — Emergency Department (HOSPITAL_COMMUNITY)
Admission: EM | Admit: 2015-12-12 | Discharge: 2015-12-18 | Disposition: A | Discharge status: Home / Self Care | Payer: BC Managed Care – PPO | Attending: Emergency Medicine | Admitting: Emergency Medicine

## 2015-12-12 ENCOUNTER — Emergency Department (HOSPITAL_COMMUNITY): Payer: BC Managed Care – PPO

## 2015-12-12 DIAGNOSIS — F3112 Bipolar disorder, current episode manic without psychotic features, moderate: Secondary | ICD-10-CM

## 2015-12-12 DIAGNOSIS — F29 Unspecified psychosis not due to a substance or known physiological condition: Secondary | ICD-10-CM

## 2015-12-12 LAB — RAPID URINE DRUG SCREEN, HOSP PERFORMED
Amphetamines: NOT DETECTED
Barbiturates: NOT DETECTED
Benzodiazepines: NOT DETECTED
Cocaine: NOT DETECTED
Opiates: NOT DETECTED
Tetrahydrocannabinol: NOT DETECTED

## 2015-12-12 LAB — URINALYSIS, ROUTINE W REFLEX MICROSCOPIC
Bilirubin Urine: NEGATIVE
Glucose, UA: NEGATIVE mg/dL
Hgb urine dipstick: NEGATIVE
Ketones, ur: NEGATIVE mg/dL
Nitrite: NEGATIVE
Protein, ur: NEGATIVE mg/dL
Specific Gravity, Urine: 1.019 (ref 1.005–1.030)
pH: 6 (ref 5.0–8.0)

## 2015-12-12 LAB — CBC WITH DIFFERENTIAL/PLATELET
Basophils Absolute: 0 K/uL (ref 0.0–0.1)
Basophils Relative: 0 %
Eosinophils Absolute: 0.1 K/uL (ref 0.0–0.7)
Eosinophils Relative: 2 %
HCT: 27 % — ABNORMAL LOW (ref 39.0–52.0)
Hemoglobin: 8.4 g/dL — ABNORMAL LOW (ref 13.0–17.0)
Lymphocytes Relative: 20 %
Lymphs Abs: 1.3 K/uL (ref 0.7–4.0)
MCH: 30 pg (ref 26.0–34.0)
MCHC: 31.1 g/dL (ref 30.0–36.0)
MCV: 96.4 fL (ref 78.0–100.0)
Monocytes Absolute: 0.4 K/uL (ref 0.1–1.0)
Monocytes Relative: 7 %
Neutro Abs: 4.5 K/uL (ref 1.7–7.7)
Neutrophils Relative %: 71 %
Platelets: 282 K/uL (ref 150–400)
RBC: 2.8 MIL/uL — ABNORMAL LOW (ref 4.22–5.81)
RDW: 14.9 % (ref 11.5–15.5)
WBC: 6.3 K/uL (ref 4.0–10.5)

## 2015-12-12 LAB — COMPREHENSIVE METABOLIC PANEL
ALBUMIN: 2.4 g/dL — AB (ref 3.5–5.0)
ALK PHOS: 52 U/L (ref 38–126)
ALT: 20 U/L (ref 17–63)
AST: 18 U/L (ref 15–41)
Anion gap: 9 (ref 5–15)
BUN: 11 mg/dL (ref 6–20)
CHLORIDE: 108 mmol/L (ref 101–111)
CO2: 23 mmol/L (ref 22–32)
CREATININE: 0.8 mg/dL (ref 0.61–1.24)
Calcium: 8.1 mg/dL — ABNORMAL LOW (ref 8.9–10.3)
GFR calc non Af Amer: >60 mL/min (ref >60)
GLUCOSE: 116 mg/dL — AB (ref 65–99)
Potassium: 3.4 mmol/L — ABNORMAL LOW (ref 3.5–5.1)
SODIUM: 140 mmol/L (ref 135–145)
Total Bilirubin: 0.6 mg/dL (ref 0.3–1.2)
Total Protein: 5.5 g/dL — ABNORMAL LOW (ref 6.5–8.1)

## 2015-12-12 LAB — URINE MICROSCOPIC-ADD ON: RBC / HPF: NONE SEEN RBC/hpf (ref 0–5)

## 2015-12-12 LAB — SALICYLATE LEVEL: Salicylate Lvl: <4 mg/dL (ref 2.8–30.0)

## 2015-12-12 LAB — ACETAMINOPHEN LEVEL: Acetaminophen (Tylenol), Serum: <10 ug/mL — ABNORMAL LOW (ref 10–30)

## 2015-12-12 LAB — VALPROIC ACID LEVEL: Valproic Acid Lvl: 21 ug/mL — ABNORMAL LOW (ref 50.0–100.0)

## 2015-12-12 LAB — ETHANOL

## 2015-12-12 MED ORDER — TEMAZEPAM 15 MG PO CAPS
30.0000 mg | ORAL_CAPSULE | Freq: Every day | ORAL | Status: DC
  Administered 2015-12-12 – 2015-12-17 (×6): 30 mg via ORAL
  Filled 2015-12-12 (×6): qty 2

## 2015-12-12 MED ORDER — FUROSEMIDE 20 MG PO TABS
20.0000 mg | ORAL_TABLET | Freq: Every day | ORAL | Status: DC | PRN
  Administered 2015-12-15 – 2015-12-16 (×2): 20 mg via ORAL
  Filled 2015-12-12 (×2): qty 1

## 2015-12-12 MED ORDER — DIVALPROEX SODIUM 250 MG PO DR TAB
500.0000 mg | DELAYED_RELEASE_TABLET | Freq: Three times a day (TID) | ORAL | Status: DC
  Administered 2015-12-12 – 2015-12-18 (×18): 500 mg via ORAL
  Filled 2015-12-12 (×18): qty 2

## 2015-12-12 MED ORDER — IBUPROFEN 400 MG PO TABS: 600.0000 mg | ORAL_TABLET | Freq: Three times a day (TID) | ORAL | Status: DC | PRN

## 2015-12-12 MED ORDER — VITAMIN C 500 MG PO TABS: 500.0000 mg | ORAL_TABLET | Freq: Every day | ORAL | Status: DC

## 2015-12-12 MED ORDER — CALCIUM CARBONATE ANTACID 500 MG PO CHEW: 1.0000 | CHEWABLE_TABLET | Freq: Two times a day (BID) | ORAL | Status: DC | PRN

## 2015-12-12 MED ORDER — BACLOFEN 20 MG PO TABS
20.0000 mg | ORAL_TABLET | Freq: Three times a day (TID) | ORAL | Status: DC
  Administered 2015-12-12 – 2015-12-18 (×17): 20 mg via ORAL
  Filled 2015-12-12 (×23): qty 1

## 2015-12-12 MED ORDER — LEVOMILNACIPRAN HCL ER 40 MG PO CP24: 40.0000 mg | ORAL_CAPSULE | Freq: Every day | ORAL | Status: DC

## 2015-12-12 MED ORDER — RISPERIDONE 0.5 MG PO TABS
2.0000 mg | ORAL_TABLET | Freq: Two times a day (BID) | ORAL | Status: DC
  Administered 2015-12-12 (×2): 2 mg via ORAL
  Administered 2015-12-13 (×2): 4 mg via ORAL
  Filled 2015-12-12: qty 12
  Filled 2015-12-12: qty 8
  Filled 2015-12-12 (×2): qty 12
  Filled 2015-12-12: qty 8

## 2015-12-12 MED ORDER — ACETAMINOPHEN 325 MG PO TABS: 650.0000 mg | ORAL_TABLET | ORAL | Status: DC | PRN

## 2015-12-12 MED ORDER — BENZTROPINE MESYLATE 1 MG PO TABS
1.0000 mg | ORAL_TABLET | Freq: Two times a day (BID) | ORAL | Status: DC
  Administered 2015-12-12 – 2015-12-18 (×13): 1 mg via ORAL
  Filled 2015-12-12 (×13): qty 1

## 2015-12-12 NOTE — ED Notes (Signed)
EDP faxed IVC paperwork at this time.

## 2015-12-12 NOTE — ED Notes (Signed)
Dinner tray ordered at this time.

## 2015-12-12 NOTE — ED Notes (Signed)
Delay in levomilnacipran administration due to pharmacy has not yet verified or sent medication.

## 2015-12-12 NOTE — BH Assessment (Addendum)
Tele Assessment Note   Alvin DakinCharles Allen is an 55 y.o. male. Pt denies SI/HI. Pt denies AVH but appears manic. Pt has been diagnosed with Bipolar, manic episodes. It was reported that the Pt has suffered a 2nd head injury and has began to behave erracticlly. The Pt fell in September and suffered a concussion and was [facility]. Pt's wife Rutha BouchardMargaret Shaver states that the Pt appears confused. Pt denies but Pt's wife states that the Pt's speech has been tangential with a flight of ideas. The Pt's speech was rapid and tangential. Mrs. Alvan DameBrannon stated that she and her children believe that the Pt is a harm to himself.The Pt appeared euphoric at times during the assessment. The Pt had a difficult time answering the writer's questions. The Pt has been hospitalized 3x for mania. The Pt [facility] Charl[facility]. The Pt is prescribed Depakote, Cogentin, and Rispedal. The Pt states he is compliant with medication.   Writer consulted with Renata Capriceonrad, DNP. Per Renata Capriceonrad, DNP Pt meets inpatient criteria. TTS to seek placement.  Diagnosis:  F31.13 Bipolar, manic, severe  Past Medical History:  Past Medical History  Diagnosis Date  . Allergic rhinitis   . Anemia   . Arthritis   . Atypical chest pain   . Benign prostatic hypertrophy with urinary obstruction   . Bipolar disorder (HCC)   . Calculus of salivary duct   . Hypokalemia   . Insomnia   . Multiple vitamin deficiency   . Nicotine dependence   . Obesity   . Hard, firm prostate   . Sialoadenitis   . H/O: facial fractures 05/18/15  . Skull fracture with contusion (HCC) 05/18/15  . Rhabdomyolysis 05/18/15    after fall  . Prolonged QT interval   . OSA (obstructive sleep apnea)     does not use cpap  . Pneumonia   . Anxiety   . Depression     Past Surgical History  Procedure Laterality Date  . Gastric bypass  2006  . Appendectomy  1980s  . Biospy prostate    . Knee  arthroscopy  1995  . Gastric bypass    . Rhinologic surgery    . Tonsillectomy and adenoidectomy    . Uvuloplasty    . Colonoscopy    . Orif wrist fracture Right 05/30/2015    Procedure: OPEN REDUCTION INTERNAL FIXATION (ORIF) RIGHT WRIST FRACTURE;  Surgeon: Bradly BienenstockFred Ortmann, MD;  Location: [facility] OR;  Service: Orthopedics;  Laterality: Right;    Family History:  Family History  Problem Relation Age of Onset  . Heart attack Maternal Grandfather   . Hyperlipidemia Mother   . Macular degeneration Father     Social History:  reports that he has been smoking E-cigarettes and Cigarettes.  He has been smoking about 0.00 packs per day for the past 29 years. He has never used smokeless tobacco. He reports that he drinks alcohol. He reports that he does not use illicit drugs.  Additional Social History:  Alcohol / Drug Use Pain Medications: Pt denies Prescriptions: Depakote, Cogentin, Risperdal Over the Counter: pt denies History of alcohol / drug use?: Yes Substance #1 Name of Substance 1: Alcohol 1 - Age of First Use: unknown 1 - Amount (size/oz): 3-4 beers 1 - Frequency: daily 1 - Duration: ongoing 1 - Last Use / Amount: 12/12/15  CIWA: CIWA-Ar BP: 114/72 mmHg Pulse Rate: 86 COWS:  PATIENT STRENGTHS: (choose at least two) Communication skills Supportive family/friends  Allergies: No Known Allergies  Home Medications:  (Not in a hospital admission)  OB/GYN Status:  No LMP for male patient.  General Assessment Data [facility] Assessment[facility] TTS Assessment: In system Is this a Tele or Face-to-Face Assessment?: Tele Assessment Is this an Initial Assessment or a Re-assessment for this encounter?: Initial Assessment Marital status: Married [place] name: NA Is patient pregnant?: No Pregnancy Status: No Living Arrangements: Spouse/significant other, Children Can pt return to current living arrangement?: Yes Admission Status: Voluntary Is patient capable of signing voluntary  admission?: Yes Referral Source: Self/Family/Friend Insurance type: BCBS     Crisis Care Plan Living Arrangements: Spouse/significant other, Children Legal Guardian: Other: (self) Name of Psychiatrist: Dr. Hulen Skains Name of Therapist: NA  Education Status Is patient currently in school?: Yes Current Grade: some college Highest grade of school patient has completed: 12 Name of school: NA Contact person: NA  Risk to self with the past 6 months Suicidal Ideation: No Has patient been a risk to self within the past 6 months prior to admission? : No Suicidal Intent: No Has patient had any suicidal intent within the past 6 months prior to admission? : No Is patient at risk for suicide?: No Suicidal Plan?: No Has patient had any suicidal plan within the past 6 months prior to admission? : No Access to Means: No What has been your use of drugs/alcohol within the last 12 months?: Alcohol Previous Attempts/Gestures: No How many times?: 0 Other Self Harm Risks: NA Triggers for Past Attempts: None known Intentional Self Injurious Behavior: None Family Suicide History: No Recent stressful life event(s): Other (Comment) (2 concussions) Persecutory voices/beliefs?: No Depression: No Depression Symptoms:  (Pt denies) Substance abuse history and/or treatment for substance abuse?: Yes (alcohol-current, marijuana- in the past) Suicide prevention information given to non-admitted patients: Not applicable  Risk to Others within the past 6 months Homicidal Ideation: No Does patient have any lifetime risk of violence toward others beyond the six months prior to admission? : No Thoughts of Harm to Others: No Current Homicidal Intent: No Current Homicidal Plan: No Access to Homicidal Means: No Identified Victim: NA History of harm to others?: No Assessment of Violence: On admission Violent Behavior Description: NA Does patient have access to weapons?: No Criminal Charges Pending?: No Does  patient have a court date: No Is patient on probation?: No  Psychosis Hallucinations: Auditory, Visual (Pt reports previously but not now) Delusions: None noted  Mental Status Report Appearance/Hygiene: Disheveled Eye Contact: Good Motor Activity: Freedom of movement Speech: Rapid Level of Consciousness: Alert Mood: Anxious Affect: Appropriate to circumstance Anxiety Level: Moderate Thought Processes: Coherent, Flight of Ideas Judgement: Impaired Orientation: Person, Place, Time, Situation Obsessive Compulsive Thoughts/Behaviors: None  Cognitive Functioning Concentration: Normal Memory: Recent Intact, Remote Intact IQ: Average Insight: Poor Impulse Control: Poor Appetite: Good Weight Loss: 0 Weight Gain: 0 Sleep: No Change Total Hours of Sleep: 9 Vegetative Symptoms: None  ADLScreening [facility] Assessment Services) Patient's cognitive ability adequate to safely complete daily activities?: No Patient able to express need for assistance with ADLs?: Yes Independently performs ADLs?: Yes (appropriate for developmental age)  Prior Inpatient Therapy Prior Inpatient Therapy: Yes Prior Therapy Dates: 2016-2017 Prior Therapy Facilty/Provider(s): [facility], [facility], [facility] Reason for Treatment: Bipolar, maniac  Prior Outpatient Therapy Prior Outpatient Therapy: Yes Prior Therapy Dates: 2017 Prior Therapy Facilty/Provider(s): Dr. Hulen Skains  Reason for Treatment: Bipolar, maniac Does patient have an ACCT team?: No  Does patient have Intensive In-House Services?  : No Does patient have [facility] services? : No Does patient have P4CC services?: No  ADL Screening (condition at time of admission) Patient's cognitive ability adequate to safely complete daily activities?: No Is the patient deaf or have difficulty hearing?: No Does the patient have difficulty seeing, even when wearing glasses/contacts?: No Does the patient have difficulty concentrating, remembering, or making  decisions?: Yes Patient able to express need for assistance with ADLs?: Yes Does the patient have difficulty dressing or bathing?: No Independently performs ADLs?: Yes (appropriate for developmental age)       Abuse/Neglect Assessment (Assessment to be complete while patient is alone) Physical Abuse: Denies Verbal Abuse: Denies Sexual Abuse: Denies Exploitation of patient/patient's resources: Denies Self-Neglect: Denies     Merchant navy officer (For Healthcare) Does patient have an advance directive?: No Would patient like information on creating an advanced directive?: No - patient declined information    Additional Information 1:1 In Past 12 Months?: No CIRT Risk: No Elopement Risk: No Does patient have medical clearance?: Yes     Disposition:  Disposition Initial Assessment Completed for this Encounter: Yes Disposition of Patient: Inpatient treatment program Type of inpatient treatment program: Adult  Emmit Pomfret 12/12/2015 3:37 PM

## 2015-12-12 NOTE — ED Notes (Signed)
Staffing notified of sitter need 

## 2015-12-12 NOTE — Progress Notes (Addendum)
Claudette Headonrad Withrow, DNP, recommended psychiatric inpatient treatment on 4/5.  Patient has been [facility] at: [facility] - per Barkley BrunsKristine, adult beds open. Accepting child/adolescent/geriatric. [facility] - per Bradly BienenstockKisha, will look at it. [facility] - per August Saucerean, accepting. [facility] - per Kathlen ModyGege, fax referral. [facility] - accepting for the waitlist. [facility] - per Jill AlexandersJustin, 1 adult male and 1 adult male bed open.  [facility] - left voicemail.  At capacity: [facility] - [facility] - [facility] - per Alan Ripperlaire  CSW will continue to follow up with referral.  [place] Abtsatia Charm Stenner, LCSWA Disposition staff 12/12/2015 6:21 PM

## 2015-12-12 NOTE — ED Notes (Signed)
Pt. Out in hallway asking to be discharged. EDP made aware. Pt. Asked to please go back to his room.
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
## 2015-12-12 NOTE — ED Notes (Signed)
Meal Tray ordered.  

## 2015-12-12 NOTE — ED Notes (Signed)
EDP at bedside  

## 2015-12-12 NOTE — ED Notes (Signed)
Patient here to be evaluated for possible concussion after [facility]. Patient alert and oriented on arrival

## 2015-12-13 MED ORDER — RISPERIDONE 0.5 MG PO TABS
2.0000 mg | ORAL_TABLET | Freq: Two times a day (BID) | ORAL | Status: DC
  Administered 2015-12-14 – 2015-12-18 (×9): 2 mg via ORAL
  Filled 2015-12-13 (×9): qty 4

## 2015-12-13 NOTE — ED Notes (Signed)
Pt woke up and came out of his room requesting some coffee.  RN told him that he could have some coffee in the morning.  He pointed at this RN and yelled "Get me some coffee, you work for me..I own this place."  He was able to be verbally redirected back to his room.

## 2015-12-13 NOTE — Progress Notes (Signed)
[facility] called to state pt's hemoglobin too low to be considered for admission.  (lab result 4/6 notes hemoglobin 8.4). Advises let [facility] know if hemoglobin is 10+, he can be considered at that time.  [facility] called to state pt is declined due to chronicity of illness.   Per Dr. Lucianne MussKumar, [facility]. Per [facility], no male 500 hall beds at this time.  Pt also referred to: [facility]- per [facility]Akya [facility]- per Suzie  Will continue following placement  Ilean SkillMeghan Tameya Kuznia, MSW, LCSW Clinical Social Work, Disposition  12/13/2015 [phone]

## 2015-12-14 MED ORDER — VITAMIN C 500 MG PO TABS
500.0000 mg | ORAL_TABLET | Freq: Every day | ORAL | Status: DC
  Administered 2015-12-14 – 2015-12-18 (×5): 500 mg via ORAL
  Filled 2015-12-14 (×6): qty 1
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
## 2015-12-14 NOTE — ED Notes (Signed)
Patient used phone. 

## 2015-12-14 NOTE — ED Notes (Signed)
Assisted patient to the bathroom. Pt will scoot independently to end of bed and bend over into fetal position on the side of the bed with eyes closed as if to attempt to touch his toes with his forehead. No coherent speech with intermittent clear words. Needs to be redirected, slow to follow commands. Shuffling unsteady gait.

## 2015-12-14 NOTE — Progress Notes (Signed)
This Clinical research associatewriter spoke with Chestine Sporelark, RN about the concerns outside facilities had with the patient's Hemoglobin being 8.4 and needing it to increase to 10.0 in order for potential placement.  He agreed to discuss this with the EDP for possible solutions.      Maryelizabeth Rowanressa Bryne Lindon, MSW, Clare CharonLCSW, LCAS [facility] Triage Specialist [phone]

## 2015-12-14 NOTE — ED Notes (Signed)
Pt wife called. Pt updated that wife requesting pt call between 10a-12p. Pt states he would like to be released today and denies SI/HI at this time.

## 2015-12-14 NOTE — ED Notes (Signed)
Regular diet ordered for lunch 

## 2015-12-14 NOTE — ED Notes (Signed)
Ambulated with steady gait to restroom

## 2015-12-14 NOTE — Progress Notes (Signed)
This writer attempted to secure placement:  [facility]- Brian- faxed information for review [facility]- Eli- faxed information for review [facility]- no answer left message [facility]- no answer [facility]- left message [facility]- Tracy- faxed information for review [facility]- Jackie- no beds [facility]- [facility]- faxed information for review [facility]- Erica- faxed information for review [facility]- no beds [facility]- Susan- no beds [facility]- Allison- faxed information for review [facility]- Kristie- only adult beds [facility]- Lisa- only adult beds [facility]- no answer [facility]- Chris- no beds  Johnthan Axtman, MSW, LCSW, LCAS [facility] Triage Specialist [phone]   

## 2015-12-14 NOTE — ED Notes (Signed)
While relieving sitter for lunch. This EMT was at patient bedside who appears very tired at this time. Patient sitting on the edge of bed rocking back and forth and when asked about laying back in bed because this EMT was afraid that he would fall on the floor, the patients states "if i fall then i deserve to fall" then proceeds to go make a phone call at the counter. Patients says he has been resting all day

## 2015-12-15 MED ORDER — TRAZODONE HCL 100 MG PO TABS
100.0000 mg | ORAL_TABLET | Freq: Every evening | ORAL | Status: DC | PRN
  Administered 2015-12-16: 100 mg via ORAL
  Filled 2015-12-15: qty 1

## 2015-12-15 MED ORDER — LORAZEPAM 1 MG PO TABS
1.0000 mg | ORAL_TABLET | Freq: Three times a day (TID) | ORAL | Status: DC | PRN
  Administered 2015-12-15 – 2015-12-16 (×2): 1 mg via ORAL
  Filled 2015-12-15 (×2): qty 1

## 2015-12-15 NOTE — ED Notes (Signed)
Sitting on side of bed waiting on dinner.

## 2015-12-15 NOTE — ED Notes (Signed)
Lasix given d/t pt's feet noted to remain swollen. Pt declining to lie down and elevate them.

## 2015-12-15 NOTE — ED Notes (Signed)
Pt lying on bed w/eyes open. Pt took meds w/o difficulty.

## 2015-12-15 NOTE — ED Notes (Addendum)
Per pt's spouse, pt no longer takes Levomilnacipran. Per Fransisca KaufmannLaura Davis, NP, [facility], recommendation - add Trazodone 100mg  [facility] prn to pt's meds d/t pt continues to be restless and has intermittent bouts of irritability. Dr Clydene PughKnott aware and advised OK to order.

## 2015-12-15 NOTE — ED Notes (Signed)
Pt must be reminded to flush toilet and wash his hands when uses restroom.

## 2015-12-15 NOTE — ED Notes (Signed)
Pt noted to continue to be restless. Ativan given.

## 2015-12-15 NOTE — ED Notes (Addendum)
Pt up ambulating to the restroom with sitter in tow. Pt very lethargic with unsteady gate. Sitter and this Clinical research associatewriter assisting pt with ambulating to the restroom. Pt giving a walker to assist and needing to be coached by sitter for direction. Pt voided without assistance. While walking back to room pt maintained unsteady gait and lethargy with assistance back into bed. Pt able to open eyes but not responding verbally to sitter. Pt was given Restoril at 2200. RN and MD ONI notified.
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
## 2015-12-15 NOTE — ED Notes (Signed)
Pt was sleeping in recliner. Pt woke up and began cursing. When asked pt what he wanted, states "I want everything!" Pt appears to be talking to himself closed his eyes. Pt then got up from chair and ambulated to bathroom w/sitter.

## 2015-12-15 NOTE — ED Notes (Signed)
Pt attempted to call his spouse - sung her a message on vm.

## 2015-12-15 NOTE — ED Notes (Signed)
Pt given sandwich and cranberry juice as requested for snack.

## 2015-12-15 NOTE — ED Notes (Signed)
Pt called spouse.

## 2015-12-15 NOTE — ED Notes (Addendum)
Pt's spouse has arrived to visit w/pt. Pt noted to be wearing 2 sets of paper scrubs.

## 2015-12-15 NOTE — ED Notes (Signed)
Pt noted to be sitting in recliner - attempting to eat lunch. Pt remains sleepy - refuses to lie down on bed.

## 2015-12-15 NOTE — ED Notes (Signed)
A regular diet ordered for breakfast.  

## 2015-12-15 NOTE — ED Notes (Signed)
Pt refusing to sit in room - attempting to ambulate in hallway. Pt is a fall risk. Pt states "I'm not going to fall but if I do, then I do." Advised pt staff must attempt to keep him safe. Security called to assist - encouraged pt to remain in his room. Pt laid down on bed.

## 2015-12-15 NOTE — ED Notes (Addendum)
Pt ambulated to bathroom with sitter in tow. No difficulty with gait, walker not needed, pt A&O x4. Pt stated "I just needed to wake up." NAD.

## 2015-12-16 LAB — CBC
HEMATOCRIT: 28.3 % — AB (ref 39.0–52.0)
Hemoglobin: 8.7 g/dL — ABNORMAL LOW (ref 13.0–17.0)
MCH: 29.8 pg (ref 26.0–34.0)
MCHC: 30.7 g/dL (ref 30.0–36.0)
MCV: 96.9 fL (ref 78.0–100.0)
PLATELETS: 307 10*3/uL (ref 150–400)
RBC: 2.92 MIL/uL — ABNORMAL LOW (ref 4.22–5.81)
RDW: 15 % (ref 11.5–15.5)
WBC: 4.9 10*3/uL (ref 4.0–10.5)

## 2015-12-16 LAB — POC OCCULT BLOOD, ED: Fecal Occult Bld: NEGATIVE

## 2015-12-16 NOTE — ED Notes (Signed)
Pt has been ambulating in room - sitter assisting w/encouraging pt not to ambulate d/t fall risk.

## 2015-12-16 NOTE — ED Notes (Addendum)
Pt continues to ambulate in room intermittently - then sits in recliner and moves to bed - continuously.

## 2015-12-16 NOTE — ED Provider Notes (Addendum)
Awaiting placement.  VSS.   Continue to monitor. Repeat CBC requested by psych facility.  Will recheck and guaic stools the next time he is able. [facility] in care everywhere. Component Name  09/29/2015    5.6  2.72 (L)  8.7 (L)  26.7 (L)  98 (H)  32  32.6  233  55.1 (H)  15.5 (H)  7.8 (L)   WBC  RBC  HGB  HCT  MCV  MCH  MCHC  Plt Ct  RDW SD  RDW CV  MPV      Alvin DibblesJon Rolene Andrades, MD 12/16/15 [phone]

## 2015-12-16 NOTE — ED Notes (Signed)
Pt continues to ambulate in room - sitter encouraging pt to sit d/t fall risk.

## 2015-12-16 NOTE — ED Notes (Signed)
Awoke pt for vitals, when he stated he needed to urinate badly. Sitter ambulated pt using walker, as had been done previously. Pt had greater difficulty ambulating and began to urinate before reaching the bathroom. Pt was cleaned up, given new pants & socks, and returned to room via wheelchair.

## 2015-12-17 NOTE — ED Notes (Signed)
Patient was given a snack and drink, and A regular lunch was ordered.

## 2015-12-17 NOTE — ED Notes (Signed)
Pt eating dinner tray °

## 2015-12-17 NOTE — Progress Notes (Signed)
[facility] due [facility].  Writer spoke with PA-C Donell SievertSpencer Simon at [facility], who recommended further plans for disposition per MD in the am, on 4/11.  Melbourne Abtsatia Jacoby Zanni, LCSWA Disposition staff 12/17/2015 9:59 PM
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
## 2015-12-17 NOTE — ED Notes (Signed)
Pt given meal tray.

## 2015-12-17 NOTE — ED Notes (Signed)
Pt given drink 

## 2015-12-18 DIAGNOSIS — F3112 Bipolar disorder, current episode manic without psychotic features, moderate: Secondary | ICD-10-CM | POA: Diagnosis not present

## 2015-12-18 NOTE — Progress Notes (Signed)
CSW engaged with Patient at her bedside. CSW explained to Patient that he has been cleared for discharge and that wife has stated that he cannot come home. Patient reports understanding and reports that he will go to his father's office upon discharge and notes that his father will get him a hotel room. Patient reports that he is able to ambulate and utilize the bus if provided a bus pass. CSW attempted Patient's wife x2 at RN's request with no answer. CSW signing off. Please contact if new need(s) arise.      Lance MussAshley Gardner,MSW, LCSW [facility] ED/[facility]M Clinical Social Worker [phone]

## 2015-12-18 NOTE — ED Notes (Signed)
TTS in progress 

## 2015-12-18 NOTE — ED Notes (Signed)
Pt states he understands instructions  

## 2015-12-18 NOTE — ED Notes (Addendum)
Patient walking in hallway with assist.

## 2015-12-18 NOTE — Discharge Instructions (Signed)
Follow up with dr. Renaldo FiddlerAdkins as soon as possible

## 2015-12-18 NOTE — ED Notes (Signed)
Patient moaning, calling to go to bathroom.  Patient ambulatory to bathroom while incontinent  of urine.  Patient had bowel movement while in bathroom and cleaned up per tech/sitter

## 2015-12-18 NOTE — ED Notes (Signed)
Pt ambuil;ating in hallway.

## 2015-12-18 NOTE — ED Notes (Signed)
Wife at bedside and states pt cannot come home and his parents will not let himm come there.

## 2015-12-18 NOTE — ED Notes (Signed)
Pt given coke and crackers --  
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
## 2015-12-18 NOTE — ED Notes (Signed)
Pt wife calls and states she will come to get patient.

## 2015-12-18 NOTE — ED Provider Notes (Signed)
  Physical Exam  BP 92/59 mmHg  Pulse 70  Temp(Src) 98 F (36.7 C) (Oral)  Resp 16  Ht 5\' 9"  (1.753 m)  Wt 186 lb 12.8 oz (84.732 kg)  BMI 27.57 kg/m2  SpO2 97%  Physical Exam  ED Course  Procedures  MDM Patient still pending placement. Sleeping this AM, no issues per nursing   Richardean Canalavid H Yao, MD 12/18/15 [phone]

## 2015-12-18 NOTE — ED Notes (Signed)
Pt ambulates easily in hall

## 2015-12-18 NOTE — ED Notes (Signed)
Ambulates easily in hall.

## 2015-12-18 NOTE — ED Notes (Signed)
Patient was given a snack and a drink, and a regular diet ordered for lunch.

## 2015-12-18 NOTE — ED Notes (Signed)
Regular dinner tray ordered @ 17:34pm.  

## 2016-07-26 ENCOUNTER — Other Ambulatory Visit (HOSPITAL_COMMUNITY): Payer: Self-pay | Admitting: Psychiatry

## 2017-03-06 ENCOUNTER — Encounter (HOSPITAL_COMMUNITY): Payer: Self-pay | Admitting: Emergency Medicine

## 2017-03-06 ENCOUNTER — Emergency Department (HOSPITAL_COMMUNITY)
Admission: EM | Admit: 2017-03-06 | Discharge: 2017-03-06 | Disposition: A | Discharge status: Home / Self Care | Payer: BC Managed Care – PPO | Attending: Emergency Medicine | Admitting: Emergency Medicine

## 2017-03-06 DIAGNOSIS — F419 Anxiety disorder, unspecified: Secondary | ICD-10-CM | POA: Diagnosis present

## 2017-03-06 DIAGNOSIS — Z79899 Other long term (current) drug therapy: Secondary | ICD-10-CM | POA: Diagnosis not present

## 2017-03-06 DIAGNOSIS — F309 Manic episode, unspecified: Secondary | ICD-10-CM | POA: Insufficient documentation

## 2017-03-06 DIAGNOSIS — F17210 Nicotine dependence, cigarettes, uncomplicated: Secondary | ICD-10-CM | POA: Diagnosis not present

## 2017-03-06 LAB — ACETAMINOPHEN LEVEL

## 2017-03-06 LAB — COMPREHENSIVE METABOLIC PANEL
ALBUMIN: 3.5 g/dL (ref 3.5–5.0)
ALT: 31 U/L (ref 17–63)
AST: 26 U/L (ref 15–41)
Alkaline Phosphatase: 70 U/L (ref 38–126)
Anion gap: 6 (ref 5–15)
BUN: 10 mg/dL (ref 6–20)
CALCIUM: 8.3 mg/dL — AB (ref 8.9–10.3)
CO2: 23 mmol/L (ref 22–32)
CREATININE: 1.18 mg/dL (ref 0.61–1.24)
Chloride: 108 mmol/L (ref 101–111)
GFR calc Af Amer: >60 mL/min (ref >60)
GFR calc non Af Amer: >60 mL/min (ref >60)
GLUCOSE: 101 mg/dL — AB (ref 65–99)
Potassium: 3.3 mmol/L — ABNORMAL LOW (ref 3.5–5.1)
SODIUM: 137 mmol/L (ref 135–145)
Total Bilirubin: 0.4 mg/dL (ref 0.3–1.2)
Total Protein: 6.3 g/dL — ABNORMAL LOW (ref 6.5–8.1)

## 2017-03-06 LAB — VALPROIC ACID LEVEL: Valproic Acid Lvl: <10 ug/mL — ABNORMAL LOW (ref 50.0–100.0)

## 2017-03-06 LAB — CBC
HCT: 36.2 % — ABNORMAL LOW (ref 39.0–52.0)
HEMOGLOBIN: 12.4 g/dL — AB (ref 13.0–17.0)
MCH: 34.2 pg — ABNORMAL HIGH (ref 26.0–34.0)
MCHC: 34.3 g/dL (ref 30.0–36.0)
MCV: 99.7 fL (ref 78.0–100.0)
Platelets: 229 10*3/uL (ref 150–400)
RBC: 3.63 MIL/uL — AB (ref 4.22–5.81)
RDW: 14.3 % (ref 11.5–15.5)
WBC: 7.8 10*3/uL (ref 4.0–10.5)

## 2017-03-06 LAB — RAPID URINE DRUG SCREEN, HOSP PERFORMED
AMPHETAMINES: NOT DETECTED
BARBITURATES: NOT DETECTED
Benzodiazepines: NOT DETECTED
COCAINE: NOT DETECTED
Opiates: NOT DETECTED
TETRAHYDROCANNABINOL: NOT DETECTED

## 2017-03-06 LAB — ETHANOL: Alcohol, Ethyl (B): <5 mg/dL (ref <5)

## 2017-03-06 LAB — SALICYLATE LEVEL: Salicylate Lvl: <7 mg/dL (ref 2.8–30.0)

## 2017-03-06 MED ORDER — NICOTINE 21 MG/24HR TD PT24
21.0000 mg | MEDICATED_PATCH | Freq: Every day | TRANSDERMAL | Status: DC
  Administered 2017-03-06: 21 mg via TRANSDERMAL
  Filled 2017-03-06: qty 1

## 2017-03-06 MED ORDER — LORAZEPAM 1 MG PO TABS
1.0000 mg | ORAL_TABLET | Freq: Four times a day (QID) | ORAL | Status: DC | PRN
  Administered 2017-03-06: 1 mg via ORAL
  Filled 2017-03-06: qty 1

## 2017-03-06 MED ORDER — CLONAZEPAM 0.5 MG PO TABS: 0.2500 mg | ORAL_TABLET | Freq: Every day | ORAL | Status: DC | PRN

## 2017-03-06 MED ORDER — MIRTAZAPINE 15 MG PO TABS: 15.0000 mg | ORAL_TABLET | Freq: Every day | ORAL | Status: DC

## 2017-03-06 MED ORDER — BACLOFEN 20 MG PO TABS
20.0000 mg | ORAL_TABLET | Freq: Every day | ORAL | Status: DC | PRN
  Filled 2017-03-06: qty 1

## 2017-03-06 MED ORDER — NICOTINE 14 MG/24HR TD PT24: 14.0000 mg | MEDICATED_PATCH | Freq: Every day | TRANSDERMAL | Status: DC

## 2017-03-06 NOTE — Consult Note (Signed)
Telepsych Consultation   Reason for Consult:  "Thoughts of psychosis" Referring Physician:  EPD Patient Identification: Alvin Allen MRN:  [id number] Principal Diagnosis: <principal problem not specified> Diagnosis:   Patient Active Problem List   Diagnosis Date Noted  . Bipolar 1 disorder with moderate mania ([place]) [F31.12] 12/18/2015  . Fracture of right distal radius [S52.501A] 05/30/2015  . CAP (community acquired pneumonia) [J18.9] 05/21/2015  . Syncope and collapse [R55] 05/21/2015  . Fall [W19.XXXA] 05/18/2015  . Multiple facial fractures ([place]) [S02.92XA] 05/18/2015  . Acute blood loss anemia [D62] 05/18/2015  . Hypomagnesemia [E83.42] 05/18/2015  . QT prolongation [R94.31] 05/18/2015  . Rhabdomyolysis [M62.82] 05/17/2015  . Elevated troponin [R74.8]   . Hyperkalemia [E87.5]   . Altered mental status [R41.82]   . OSA (obstructive sleep apnea) [G47.33] 08/28/2011  . Insomnia [G47.00] 08/28/2011    Total Time spent with patient: 30 minutes  Subjective: Per tele- assessment-Alvin Allen is an 56 y.o.married male, brought into [facility], by his wife, Javiel Canepa.  Patient reported having a history of Bipolar I Disorder since the age of 42, resulting in experiences with psychotic episodes.  Patient stated that recent experiences caused him to seek help and go to the emergency room.  Patient reported SIs, however stated that he did not have a plan.  Patient reported having past plans, until his recent change in medication.  Patient stated having a recent being prescribed Remeron, with his ongoing prescription of Abilify, by his psychiatrist.  Patient made statements, such as "I believe life is pretty horrible" and "God please take me."  Patient reported auditory and visual hallucinations that frequently occur within his home.  Patient stated he was unable to distinguish the images and sources of the noises that he heard.  Patient denies, HI/SA or access to weapons.  Patient reported being  compliant with medications.   Patient reported ongoing experiences of depressive symptoms, such as fatigue, insomnia, isolation, tearfulness, feelings of worthlessness, loss of interest in usual pleasures, and irritability.    Patient stated that he is retired and currently receives disability benefits.  Patient reported experiences with stressors, stemming from financial concerns due to feeling that he was unable to provide for his family.  Patient identified supportive factors from his wife and sons.  Patient reports seeing a psychiatrist at [facility].   Patient reported having a history of inpatient treatment for Bipolar Disorder, since the age of 89, in various locations, such as [facility] and [place], and other unknown treatment facilities.   During assessment, Patient was cooperative and alert.  Patient was dressed in scrubs.  Patient's concentration was poor and insight fair.  Patient frequently closed his eyes, patted himself on the head, held his ears, and appeared to be shaking.  Patient's speech was logical and coherent, however was slow, soft, slurred, and inaudible at times.  Patient was oriented to the time, person, location, and situation.  Patient reported wanting to receive inpatient treatment.  On reevaluation: Alvin Allen is awake, alert and oriented *3. (sitter at bedside). Reports " I am feeling better today and I had a goodnight's rest."  Patient reports some concerns with medications adjustment, for sleeping. Patient reports he has a appointment with his psychiatrist MD Anne [facility].  Denies suicidal or homicidal ideation. Denies auditory or visual hallucination and does not appear to be responding to internal stimuli.  Patient reports taken is medications as prdx and is tolerating medications well. Reports he  hasn't been resting like he could have. Support, encouragement and reassurance was provided. Patient was  encouraged to discusses sleeping issues with his psychiatric at his follow-up appointment.  Past Psychiatric History:   Risk to Self: Suicidal Ideation: Yes-Currently Present Suicidal Intent: Yes-Currently Present Is patient at risk for suicide?: No Suicidal Plan?: No (Pt. denies) Access to Means: No (Patient denies) What has been your use of drugs/alcohol within the last 12 months?: None (Patient denies) How many times?: 0 Other Self Harm Risks: None Triggers for Past Attempts: None known Intentional Self Injurious Behavior: None Risk to Others: Homicidal Ideation: No (Patient denies) Thoughts of Harm to Others: No Current Homicidal Intent: No Current Homicidal Plan: No Access to Homicidal Means: No (Patient denies) Identified Victim: None History of harm to others?: No (Patient denies) Assessment of Violence: On admission Violent Behavior Description: None Does patient have access to weapons?: No (Patient denies) Criminal Charges Pending?: No Does patient have a court date: No Prior Inpatient Therapy: Prior Inpatient Therapy: Yes Prior Therapy Dates: 2016 and 2017 Prior Therapy Facilty/Provider(s): [facility] Reason for Treatment: Bipolar I Disorder Prior Outpatient Therapy: Prior Outpatient Therapy: No Prior Therapy Dates: None Prior Therapy Facilty/Provider(s): None Reason for Treatment: None Does patient have an ACCT team?: No Does patient have Intensive In-House Services?  : No Does patient have [facility] services? : No Does patient have P4CC services?: No  Past Medical History:  Past Medical History:  Diagnosis Date  . Allergic rhinitis   . Anemia   . Anxiety   . Arthritis   . Atypical chest pain   . Benign prostatic hypertrophy with urinary obstruction   . Bipolar disorder ([place])   . Calculus of salivary duct   . Depression   . H/O: facial fractures 05/18/15  . Hard, firm prostate   . Hypokalemia   . Insomnia   . Multiple vitamin  deficiency   . Nicotine dependence   . Obesity   . OSA (obstructive sleep apnea)    does not use cpap  . Pneumonia   . Prolonged QT interval   . Rhabdomyolysis 05/18/15   after fall  . Sialoadenitis   . Skull fracture with contusion ([place]) 05/18/15    Past Surgical History:  Procedure Laterality Date  . APPENDECTOMY  1980s  . biospy prostate    . COLONOSCOPY    . GASTRIC BYPASS  2006  . GASTRIC BYPASS    . KNEE ARTHROSCOPY  1995  . ORIF WRIST FRACTURE Right 05/30/2015   Procedure: OPEN REDUCTION INTERNAL FIXATION (ORIF) RIGHT WRIST FRACTURE;  Surgeon: Iran Planas, MD;  Location: [facility];  Service: Orthopedics;  Laterality: Right;  . rhinologic surgery    . TONSILLECTOMY AND ADENOIDECTOMY    . uvuloplasty     Family History:  Family History  Problem Relation Age of Onset  . Hyperlipidemia Mother   . Macular degeneration Father   . Heart attack Maternal Grandfather    Family Psychiatric  History: Social History:  History  Alcohol Use  . Yes    Comment: hx of "dependency" on alcohol     History  Drug Use No    Comment: "not for awhile" last use a year ago    Social History   Social History  . Marital status: Married    Spouse name: Joycelyn Schmid  . Number of children: 2  . 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 Smoking status: Current Every Day Smoker    Packs/day: 0.00    Years: 29.00    Types: E-cigarettes, Cigarettes  . Smokeless tobacco: Never Used     Comment: 11/03/11 smoking 15 cigs daily  . Alcohol use Yes     Comment: hx of "dependency" on alcohol  . Drug use: No     Comment: "not for awhile" last use a year ago  . Sexual activity: Yes   Other Topics Concern  . None   Social History Narrative   ** Merged History Encounter **       Additional Social History:    Allergies:  No Known Allergies  Labs:  Results for orders placed or performed during the hospital encounter of 03/06/17  (from the past 48 hour(s))  Comprehensive metabolic panel     Status: Abnormal   Collection Time: 03/06/17  4:22 AM  Result Value Ref Range   Sodium 137 135 - 145 mmol/L   Potassium 3.3 (L) 3.5 - 5.1 mmol/L   Chloride 108 101 - 111 mmol/L   CO2 23 22 - 32 mmol/L   Glucose, Bld 101 (H) 65 - 99 mg/dL   BUN 10 6 - 20 mg/dL   Creatinine, Ser 1.18 0.61 - 1.24 mg/dL   Calcium 8.3 (L) 8.9 - 10.3 mg/dL   Total Protein 6.3 (L) 6.5 - 8.1 g/dL   Albumin 3.5 3.5 - 5.0 g/dL   AST 26 15 - 41 U/L   ALT 31 17 - 63 U/L   Alkaline Phosphatase 70 38 - 126 U/L   Total Bilirubin 0.4 0.3 - 1.2 mg/dL   GFR calc non Af Amer >60 >60 mL/min   GFR calc Af Amer >60 >60 mL/min    Comment: (NOTE) The eGFR has been calculated using the CKD EPI equation. This calculation has not been validated in all clinical situations. eGFR's persistently <60 mL/min signify possible Chronic Kidney Disease.    Anion gap 6 5 - 15  Ethanol     Status: None   Collection Time: 03/06/17  4:22 AM  Result Value Ref Range   Alcohol, Ethyl (B) <5 <5 mg/dL    Comment:        LOWEST DETECTABLE LIMIT FOR SERUM ALCOHOL IS 5 mg/dL FOR MEDICAL PURPOSES ONLY   Salicylate level     Status: None   Collection Time: 03/06/17  4:22 AM  Result Value Ref Range   Salicylate Lvl <7.7 2.8 - 30.0 mg/dL  Acetaminophen level     Status: Abnormal   Collection Time: 03/06/17  4:22 AM  Result Value Ref Range   Acetaminophen (Tylenol), Serum <10 (L) 10 - 30 ug/mL    Comment:        THERAPEUTIC CONCENTRATIONS VARY SIGNIFICANTLY. A RANGE OF 10-30 ug/mL MAY BE AN EFFECTIVE CONCENTRATION FOR MANY PATIENTS. HOWEVER, SOME ARE BEST TREATED AT CONCENTRATIONS OUTSIDE THIS RANGE. ACETAMINOPHEN CONCENTRATIONS >150 ug/mL AT 4 HOURS AFTER INGESTION AND >50 ug/mL AT 12 HOURS AFTER INGESTION ARE OFTEN ASSOCIATED WITH TOXIC REACTIONS.   cbc     Status: Abnormal   Collection Time: 03/06/17  4:22 AM  Result Value Ref Range   WBC 7.8 4.0 - 10.5 K/uL    RBC 3.63 (L) 4.22 - 5.81 MIL/uL   Hemoglobin 12.4 (L) 13.0 - 17.0 g/dL   HCT 36.2 (L) 39.0 - 52.0 %   MCV 99.7 78.0 - 100.0 fL   MCH 34.2 (H) 26.0 - 34.0 pg  MCHC 34.3 30.0 - 36.0 g/dL   RDW 14.3 11.5 - 15.5 %   Platelets 229 150 - 400 K/uL  Rapid urine drug screen (hospital performed)     Status: None   Collection Time: 03/06/17  4:22 AM  Result Value Ref Range   Opiates NONE DETECTED NONE DETECTED   Cocaine NONE DETECTED NONE DETECTED   Benzodiazepines NONE DETECTED NONE DETECTED   Amphetamines NONE DETECTED NONE DETECTED   Tetrahydrocannabinol NONE DETECTED NONE DETECTED   Barbiturates NONE DETECTED NONE DETECTED    Comment:        DRUG SCREEN FOR MEDICAL PURPOSES ONLY.  IF CONFIRMATION IS NEEDED FOR ANY PURPOSE, NOTIFY LAB WITHIN 5 DAYS.        LOWEST DETECTABLE LIMITS FOR URINE DRUG SCREEN Drug Class       Cutoff (ng/mL) Amphetamine      1000 Barbiturate      200 Benzodiazepine   749 Tricyclics       449 Opiates          300 Cocaine          300 THC              50   Valproic acid level     Status: Abnormal   Collection Time: 03/06/17  4:22 AM  Result Value Ref Range   Valproic Acid Lvl <10 (L) 50.0 - 100.0 ug/mL    Comment: RESULTS CONFIRMED BY MANUAL DILUTION    Current Facility-Administered Medications  Medication Dose Route Frequency Provider Last Rate Last Dose  . baclofen (LIORESAL) tablet 20 mg  20 mg Oral Daily PRN Ripley Fraise, MD      . clonazePAM Bobbye [place]) tablet 0.25 mg  0.25 mg Oral Daily PRN Ripley Fraise, MD      . LORazepam (ATIVAN) tablet 1 mg  1 mg Oral Q6H PRN Ripley Fraise, MD   1 mg at 03/06/17 0900  . mirtazapine (REMERON) tablet 15 mg  15 mg Oral QHS Ripley Fraise, MD      . nicotine (NICODERM CQ - dosed in mg/24 hours) patch 21 mg  21 mg Transdermal Daily Little, Wenda Overland, MD   21 mg at 03/06/17 0813   Current Outpatient Prescriptions  Medication Sig Dispense Refill  . ARIPiprazole (ABILIFY) 15 MG tablet Take 15 mg by  mouth daily.    . baclofen (LIORESAL) 20 MG tablet Take 20 mg by mouth daily as needed for muscle spasms.     . clonazePAM (KLONOPIN) 0.5 MG tablet Take 0.25 mg by mouth daily as needed for anxiety.    . mirtazapine (REMERON) 15 MG tablet Take 15 mg by mouth daily.      Musculoskeletal: Strength & Muscle Tone: UTA Gait & Station: UTA Patient leans: N/A  Psychiatric Specialty Exam: Physical Exam  Nursing note and vitals reviewed. Constitutional: He appears well-developed.    ROS  Blood pressure (!) 127/91, pulse 66, temperature 98 F (36.7 C), temperature source Oral, resp. rate 18, SpO2 98 %.There is no height or weight on file to calculate BMI.  General Appearance: Casual paper scrubs  Eye Contact:  Fair  Speech:  Clear and Coherent  Volume:  Normal  Mood:  Anxious  Affect:  Congruent  Thought Process:  Coherent  Orientation:  Full (Time, Place, and Person)  Thought Content:  Hallucinations: None  Suicidal Thoughts:  No  Homicidal Thoughts:  No  Memory:  Immediate;   Fair Recent;   Fair Remote;   Fair  Judgement:  Fair  Insight:  Fair  Psychomotor Activity:   Concentration:  Concentration: Fair  Recall:  [employer] of Knowledge:  Fair  Language:  Good  Akathisia:  No  Handed:    AIMS (if indicated):     Assets:  Communication Skills Desire for Improvement Resilience Social Support  ADL's:  Intact  Cognition:  WNL  Sleep:        I agree with current treatment plan on  03/06/2017, Patient seen tele-assessment  for psychiatric evaluation follow-up, chart reviewed and case discussed with the MD [facility] and Treatment team. Reviewed the information documented and agree with the disposition. Spoke to MD Little regarding dispo.  Disposition: No evidence of imminent risk to self or others at present.   Patient does not meet criteria for psychiatric inpatient admission. Supportive therapy provided about ongoing stressors. Discussed crisis plan, support from social  network, calling 911, coming to the Emergency Department, and calling Suicide Hotline. - Keep f/u with MD Barbie Banner at the [facility] treatment [facility]  Derrill [place], NP 03/06/2017 12:01 PM   Agree with NP assessment and plan

## 2017-03-06 NOTE — ED Notes (Addendum)
Pt. Left without signing out. RN was able to go over d/c instructions prior. Pt. Belongings given back. No valuables in safe and no medications in pharmacy. Pt. Agreed to call hotline if he felt like he was going to hurt himself or anyone else. Pt. Left with wife and children and will f/u with [facility].  Vitals entered late-actual vitals ~1335

## 2017-03-06 NOTE — ED Notes (Signed)
TTS being done at bedside 

## 2017-03-06 NOTE — ED Notes (Signed)
Pt. On TTS.

## 2017-03-06 NOTE — ED Triage Notes (Signed)
Pt reports that he believes he is on the verge of "a psychotic episode" as he has not slept tonight.  He reports this is the beginning of his psychotic thinking.  He denies SI/HI and V/A hallucinations, but speech is rapid, thoughts are disorganized.  He is very cooperative and want's to "nip this in the bud" before "it gets bad."

## 2017-03-06 NOTE — ED Notes (Signed)
Pt is given wine colored scrubs to change into and socks. Pt cloths placed in a hospital bag. Security called to wand pt.

## 2017-03-06 NOTE — ED Provider Notes (Signed)
[facility] Provider Note   CSN: [id number] Arrival date & time: 03/06/17  0347     History   Chief Complaint Chief Complaint  Patient presents with  . Insomnia  . Psychiatric Evaluation    HPI Alvin Allen is a 56 y.o. male.  The history is provided by the patient and the spouse.  Mental Health Problem  Presenting symptoms: bizarre behavior   Presenting symptoms: no suicide attempt   Degree of incapacity (severity):  Moderate Onset quality:  Gradual Timing:  Constant Progression:  Worsening Context: recent medication change   Relieved by:  Nothing Worsened by:  Nothing Associated symptoms: anxiety   Associated symptoms: no headaches    Patient presents with wife for psychiatric evaluation He has h/o bipolar disorder There has been a recent change in his meds Over past several days he has had decreased sleep, wife noted he has been talking to himself and he has been disorganized with rapid speech He feels that he is going to worsen at home so he presented for evaluation  He denies fever/vomiting/HA He reports chronic low back pain Past Medical History:  Diagnosis Date  . Allergic rhinitis   . Anemia   . Anxiety   . Arthritis   . Atypical chest pain   . Benign prostatic hypertrophy with urinary obstruction   . Bipolar disorder ([facility])   . Calculus of salivary duct   . Depression   . H/O: facial fractures 05/18/15  . Hard, firm prostate   . Hypokalemia   . Insomnia   . Multiple vitamin deficiency   . Nicotine dependence   . Obesity   . OSA (obstructive sleep apnea)    does not use cpap  . Pneumonia   . Prolonged QT interval   . Rhabdomyolysis 05/18/15   after fall  . Sialoadenitis   . Skull fracture with contusion ([facility]) 05/18/15    Patient Active Problem List   Diagnosis Date Noted  . Bipolar 1 disorder with moderate mania ([facility]) 12/18/2015  . Fracture of right distal radius 05/30/2015  . CAP (community acquired pneumonia) 05/21/2015  .  Syncope and collapse 05/21/2015  . Fall 05/18/2015  . Multiple facial fractures ([facility]) 05/18/2015  . Acute blood loss anemia 05/18/2015  . Hypomagnesemia 05/18/2015  . QT prolongation 05/18/2015  . Rhabdomyolysis 05/17/2015  . Elevated troponin   . Hyperkalemia   . Altered mental status   . OSA (obstructive sleep apnea) 08/28/2011  . Insomnia 08/28/2011    Past Surgical History:  Procedure Laterality Date  . APPENDECTOMY  1980s  . biospy prostate    . COLONOSCOPY    . GASTRIC BYPASS  2006  . GASTRIC BYPASS    . KNEE ARTHROSCOPY  1995  . ORIF WRIST FRACTURE Right 05/30/2015   Procedure: OPEN REDUCTION INTERNAL FIXATION (ORIF) RIGHT WRIST FRACTURE;  Surgeon: Bradly BienenstockFred Ortmann, MD;  [facility];  Service: Orthopedics;  Laterality: Right;  . rhinologic surgery    . TONSILLECTOMY AND ADENOIDECTOMY    . uvuloplasty         Home Medications    Prior to Admission medications   Medication Sig Start Date End Date Taking? Authorizing Provider  ARIPiprazole (ABILIFY) 15 MG tablet Take 15 mg by mouth daily.   Yes [provider]  baclofen (LIORESAL) 20 MG tablet Take 20 mg by mouth daily as needed for muscle spasms.    Yes [provider]  clonazePAM (KLONOPIN) 0.5 MG tablet Take 0.25 mg by mouth daily as  needed for anxiety.   Yes [provider]  mirtazapine (REMERON) 15 MG tablet Take 15 mg by mouth daily.   Yes [provider]    Family History Family History  Problem Relation Age of Onset  . Hyperlipidemia Mother   . Macular degeneration Father   . Heart attack Maternal Grandfather     Social History Social History  Substance Use Topics  . Smoking status: Current Every Day Smoker    Packs/day: 0.00    Years: 29.00    Types: E-cigarettes, Cigarettes  . Smokeless tobacco: Never Used     Comment: 11/03/11 smoking 15 cigs daily  . Alcohol use Yes     Comment: hx of "dependency" on alcohol     Allergies   Patient has no known  allergies.   Review of Systems Review of Systems  Constitutional: Negative for fever.  Neurological: Negative for headaches.  Psychiatric/Behavioral: Positive for sleep disturbance. The patient is nervous/anxious.   All other systems reviewed and are negative.    Physical Exam Updated Vital Signs BP (!) 127/91 (BP Location: Left Arm)   Pulse 66   Temp 98 F (36.7 C) (Oral)   Resp 18   SpO2 98%   Physical Exam CONSTITUTIONAL: Disheveled, anxious HEAD: Normocephalic/atraumatic EYES: EOMI/PERRL ENMT: Mucous membranes moist NECK: supple no meningeal signs SPINE/BACK:entire spine nontender CV: S1/S2 noted, no murmurs/rubs/gallops noted LUNGS: Lungs are clear to auscultation bilaterally, no apparent distress ABDOMEN: soft, nontender  GU:no cva tenderness NEURO: Pt is awake/alert/appropriate, moves all extremitiesx4.  EXTREMITIES: pulses normal/equal, full ROM SKIN: warm, color normal PSYCH: anxious.  He has poor eye contact.  He seems disorganized with mildly pressured speech  ED Treatments / Results  Labs (all labs ordered are listed, but only abnormal results are displayed) Labs Reviewed  COMPREHENSIVE METABOLIC PANEL - Abnormal; Notable for the following:       Result Value   Potassium 3.3 (*)    Glucose, Bld 101 (*)    Calcium 8.3 (*)    Total Protein 6.3 (*)    All other components within normal limits  ACETAMINOPHEN LEVEL - Abnormal; Notable for the following:    Acetaminophen (Tylenol), Serum <10 (*)    All other components within normal limits  CBC - Abnormal; Notable for the following:    RBC 3.63 (*)    Hemoglobin 12.4 (*)    HCT 36.2 (*)    MCH 34.2 (*)    All other components within normal limits  VALPROIC ACID LEVEL - Abnormal; Notable for the following:    Valproic Acid Lvl <10 (*)    All other components within normal limits  ETHANOL  SALICYLATE LEVEL  RAPID URINE DRUG SCREEN, HOSP PERFORMED    EKG  EKG Interpretation None        Radiology No results found.  Procedures Procedures (including critical care time)  Medications Ordered in ED Medications  nicotine (NICODERM CQ - dosed in mg/24 hours) patch 14 mg (not administered)  LORazepam (ATIVAN) tablet 1 mg (not administered)  baclofen (LIORESAL) tablet 20 mg (not administered)  clonazePAM (KLONOPIN) tablet 0.25 mg (not administered)  mirtazapine (REMERON) tablet 15 mg (not administered)     Initial Impression / Assessment and Plan / ED Course  I have reviewed the triage vital signs and the nursing notes.  Pertinent labs results that were available during my care of the patient were reviewed by me and considered in my medical decision making (see chart for details).  6:16 AM Pt medically stable Will consult psych  Final Clinical Impressions(s) / ED Diagnoses   Final diagnoses:  [facility])    New Prescriptions New Prescriptions   No medications on file     Zadie Rhine, MD 03/06/17 [phone]

## 2017-03-06 NOTE — BH Assessment (Addendum)
Tele Assessment Note   Alvin Allen is an 56 y.o.married male, brought into [facility], by his wife, Alvin Allen.  Patient reported having a history of Bipolar I Disorder since the age of 36, resulting in experiences with psychotic episodes.  Patient stated that recent experiences caused him to seek help and go to the emergency room.  Patient reported SIs, however stated that he did not have a plan.  Patient reported having past plans, until his recent change in medication.  Patient stated having a recent being prescribed Remeron, with his ongoing prescription of Abilify, by his psychiatrist.  Patient made statements, such as "I believe life is pretty horrible" and "God please take me."  Patient reported auditory and visual hallucinations that frequently occur within his home.  Patient stated he was unable to distinguish the images and sources of the noises that he heard.  Patient denies, HI/SA or access to weapons.  Patient reported being compliant with medications.   Patient reported ongoing experiences of depressive symptoms, such as fatigue, insomnia, isolation, tearfulness, feelings of worthlessness, loss of interest in usual pleasures, and irritability.    Patient stated that he is retired and currently receives disability benefits.  Patient reported experiences with stressors, stemming from financial concerns due to feeling that he was unable to provide for his family.  Patient identified supportive factors from his wife and sons.  Patient reports seeing a psychiatrist at [facility].   Patient reported having a history of inpatient treatment for Bipolar Disorder, since the age of 6, in various locations, such as [facility] and [facility], and other unknown treatment facilities.   During assessment, Patient was cooperative and alert.  Patient was dressed in scrubs.  Patient's concentration was poor and insight fair.  Patient frequently closed his eyes, patted himself on the  head, held his ears, and appeared to be shaking.  Patient's speech was logical and coherent, however was slow, soft, slurred, and inaudible at times.  Patient was oriented to the time, person, location, and situation.  Patient reported wanting to receive inpatient treatment.   Diagnosis: Major Depressive Disorder, recurrent, severe with psychotic features.   Per Medical Records, Bipolar I Disorder   Past Medical History:  Past Medical History:  Diagnosis Date  . Allergic rhinitis   . Anemia   . Anxiety   . Arthritis   . Atypical chest pain   . Benign prostatic hypertrophy with urinary obstruction   . Bipolar disorder (HCC)   . Calculus of salivary duct   . Depression   . H/O: facial fractures 05/18/15  . Hard, firm prostate   . Hypokalemia   . Insomnia   . Multiple vitamin deficiency   . Nicotine dependence   . Obesity   . OSA (obstructive sleep apnea)    does not use cpap  . Pneumonia   . Prolonged QT interval   . Rhabdomyolysis 05/18/15   after fall  . Sialoadenitis   . Skull fracture with contusion (HCC) 05/18/15    Past Surgical History:  Procedure Laterality Date  . APPENDECTOMY  1980s  . biospy prostate    . COLONOSCOPY    . GASTRIC BYPASS  2006  . GASTRIC BYPASS    . KNEE ARTHROSCOPY  1995  . ORIF WRIST FRACTURE Right 05/30/2015   Procedure: OPEN REDUCTION INTERNAL FIXATION (ORIF) RIGHT WRIST FRACTURE;  Surgeon: Bradly Bienenstock, MD;  Location: [facility];  Service: Orthopedics;  Laterality: Right;  . rhinologic surgery    .  TONSILLECTOMY AND ADENOIDECTOMY    . uvuloplasty      Family History:  Family History  Problem Relation Age of Onset  . Hyperlipidemia Mother   . Macular degeneration Father   . Heart attack Maternal Grandfather     Social History:  reports that he has been smoking E-cigarettes and Cigarettes.  He has been smoking about 0.00 packs per day for the past 29.00 years. He has never used smokeless tobacco. He reports that he drinks alcohol. He reports  that he does not use drugs.  Additional Social History:  Alcohol / Drug Use Pain Medications: Patient denies Prescriptions: Patient denies Over the Counter: Patient denies History of alcohol / drug use?: No history of alcohol / drug abuse  CIWA: CIWA-Ar BP: (!) 127/91 Pulse Rate: 66 COWS:    PATIENT STRENGTHS: (choose at least two) Ability for insight Average or above average intelligence Communication skills General fund of knowledge Motivation for treatment/growth Supportive family/friends  Allergies: No Known Allergies  Home Medications:  (Not in a hospital admission)  OB/GYN Status:  No LMP for male patient.  General Assessment Data Location of Assessment: [facility] ED TTS Assessment: In system Is this a Tele or Face-to-Face Assessment?: Tele Assessment Is this an Initial Assessment or a Re-assessment for this encounter?: Initial Assessment Marital status: Married (Wife Alvin Allen) [place] name: N/A Is patient pregnant?: No Pregnancy Status: No Living Arrangements: Spouse/significant other, Children (2 sons) Can pt return to current living arrangement?: Yes Admission Status: Voluntary Is patient capable of signing voluntary admission?: Yes Referral Source: Self/Family/Friend Insurance type: Nurse, adult Exam [facility] Walk-in ONLY) Medical Exam completed: Yes  Crisis Care Plan Living Arrangements: Spouse/significant other, Children (2 sons) Legal Guardian: Other: (Self) Name of Psychiatrist: [facility] Name of Therapist: None  Education Status Is patient currently in school?: No Current Grade: N/A Highest grade of school patient has completed: Some College Name of school: N/A Contact person: N/A  Risk to self with the past 6 months Suicidal Ideation: Yes-Currently Present Has patient been a risk to self within the past 6 months prior to admission? : Yes Suicidal Intent: Yes-Currently Present Has patient had any suicidal intent  within the past 6 months prior to admission? : Yes Is patient at risk for suicide?: No Suicidal Plan?: No (Pt. denies) Has patient had any suicidal plan within the past 6 months prior to admission? : No (Patient denies) Access to Means: No (Patient denies) What has been your use of drugs/alcohol within the last 12 months?: None (Patient denies) Previous Attempts/Gestures: No (Patient denies) How many times?: 0 Other Self Harm Risks: None Triggers for Past Attempts: None known Intentional Self Injurious Behavior: None Family Suicide History: No Recent stressful life event(s): Financial Problems, Other (Comment) Persecutory voices/beliefs?: No Depression: Yes Depression Symptoms: Tearfulness, Despondent, Isolating, Fatigue, Guilt, Loss of interest in usual pleasures, Feeling worthless/self pity, Feeling angry/irritable Substance abuse history and/or treatment for substance abuse?: No Suicide prevention information given to non-admitted patients: Not applicable  Risk to Others within the past 6 months Homicidal Ideation: No (Patient denies) Does patient have any lifetime risk of violence toward others beyond the six months prior to admission? : No (Patient denies) Thoughts of Harm to Others: No Current Homicidal Intent: No Current Homicidal Plan: No Access to Homicidal Means: No (Patient denies) Identified Victim: None History of harm to others?: No (Patient denies) Assessment of Violence: On admission Violent Behavior Description: None Does patient have access to weapons?: No (Patient  denies) Criminal Charges Pending?: No Does patient have a court date: No Is patient on probation?: No  Psychosis Hallucinations: Auditory, Visual Delusions: None noted  Mental Status Report Appearance/Hygiene: Unremarkable, In scrubs Eye Contact: Poor Motor Activity: Shuffling, Rigidity, Restlessness Speech: Logical/coherent, Slow, Soft, Slurred (In audible ) Level of Consciousness: Restless,  Alert Mood: Depressed, Anxious, Worthless, low self-esteem Affect: Appropriate to circumstance, Depressed, Sad Anxiety Level: Severe Thought Processes: Relevant, Circumstantial, Tangential, Flight of Ideas Judgement: Unimpaired Orientation: Person, Place, Time, Situation Obsessive Compulsive Thoughts/Behaviors: None  Cognitive Functioning Concentration: Poor Memory: Recent Intact, Remote Intact IQ: Average Insight: Fair Impulse Control: Fair Appetite: Fair Weight Loss: 0 Weight Gain: 0 Sleep: Decreased Total Hours of Sleep: 4 Vegetative Symptoms: None  ADLScreening [facility] Assessment Services) Patient's cognitive ability adequate to safely complete daily activities?: Yes Patient able to express need for assistance with ADLs?: Yes Independently performs ADLs?: Yes (appropriate for developmental age)  Prior Inpatient Therapy Prior Inpatient Therapy: Yes Prior Therapy Dates: 2016 and 2017 Prior Therapy Facilty/Provider(s): [facility] Reason for Treatment: Bipolar I Disorder  Prior Outpatient Therapy Prior Outpatient Therapy: No Prior Therapy Dates: None Prior Therapy Facilty/Provider(s): None Reason for Treatment: None Does patient have an ACCT team?: No Does patient have Intensive In-House Services?  : No Does patient have [facility] services? : No Does patient have P4CC services?: No  ADL Screening (condition at time of admission) Patient's cognitive ability adequate to safely complete daily activities?: Yes Is the patient deaf or have difficulty hearing?: No Does the patient have difficulty seeing, even when wearing glasses/contacts?: No Does the patient have difficulty concentrating, remembering, or making decisions?: No Patient able to express need for assistance with ADLs?: Yes Does the patient have difficulty dressing or bathing?: No Independently performs ADLs?: Yes (appropriate for developmental age) Does the patient have difficulty walking  or climbing stairs?: No Weakness of Legs: None Weakness of Arms/Hands: None  Home Assistive Devices/Equipment Home Assistive Devices/Equipment: None    Abuse/Neglect Assessment (Assessment to be complete while patient is alone) Physical Abuse: Denies Verbal Abuse: Denies Sexual Abuse: Denies Exploitation of patient/patient's resources: Denies Self-Neglect: Denies     Merchant navy officerAdvance Directives (For Healthcare) Does Patient Have a Medical Advance Directive?: No Would patient like information on creating a medical advance directive?: No - Patient declined    Additional Information 1:1 In Past 12 Months?: No CIRT Risk: No Elopement Risk: No Does patient have medical clearance?: Yes     Disposition:  Disposition Initial Assessment Completed for this Encounter: Yes Disposition of Patient: Inpatient treatment program (Per Nira ConnJason Berry, NP) Type of inpatient treatment program: Adult  Talbert NanJaniah N Payslie Mccaig 03/06/2017 7:04 AM

## 2017-03-06 NOTE — ED Notes (Signed)
Awaiting note from provider prior to d/c.

## 2017-03-06 NOTE — ED Notes (Addendum)
Pt's belongings inventoried, stored in locker room. Pt's jewelry sent home with wife. Wanded by security, given Malaw[place]urkey sandwich and breakfast ordered.

## 2017-03-06 NOTE — ED Notes (Signed)
Pt. Ambulatory with sitter to restroom.

## 2017-03-06 NOTE — B[facility] Counselor (Signed)
Patient meets criteria for inpatient criteria, Per Nira ConnJason Berry, NP  Elmore GuiseJaniah Ledford Goodson, LPC-A & LCAS-A Therapeutic Triage Specialist [phone]

## 2017-03-06 NOTE — ED Provider Notes (Signed)
Per psych team including Dr. Jama Flavorsobos, pt stable for discharge and will f/u in clinic w/ Dr. Michele McalpineAikin.    Alvin Allen, Ambrose Finlandachel Morgan, MD 03/06/17 1309

## 2017-03-06 NOTE — Progress Notes (Addendum)
Per Dr. Jama Flavorsobos, patient is recommended for D/C.  Denny PeonErin, RN notified.   Baldo DaubJolan Alysia Scism MSW, LCSWA CSW Disposition [phone]

## 2017-03-14 ENCOUNTER — Emergency Department (HOSPITAL_COMMUNITY)
Admission: EM | Admit: 2017-03-14 | Discharge: 2017-03-15 | Disposition: A | Discharge status: Home / Self Care | Payer: BC Managed Care – PPO | Attending: Emergency Medicine | Admitting: Emergency Medicine

## 2017-03-14 ENCOUNTER — Encounter (HOSPITAL_COMMUNITY): Payer: Self-pay | Admitting: Emergency Medicine

## 2017-03-14 ENCOUNTER — Emergency Department (HOSPITAL_COMMUNITY): Payer: BC Managed Care – PPO

## 2017-03-14 DIAGNOSIS — W208XXA Other cause of strike by thrown, projected or falling object, initial encounter: Secondary | ICD-10-CM | POA: Diagnosis not present

## 2017-03-14 DIAGNOSIS — S40021A Contusion of right upper arm, initial encounter: Secondary | ICD-10-CM

## 2017-03-14 DIAGNOSIS — Y998 Other external cause status: Secondary | ICD-10-CM | POA: Diagnosis not present

## 2017-03-14 DIAGNOSIS — Y92009 Unspecified place in unspecified non-institutional (private) residence as the place of occurrence of the external cause: Secondary | ICD-10-CM | POA: Diagnosis not present

## 2017-03-14 DIAGNOSIS — F419 Anxiety disorder, unspecified: Secondary | ICD-10-CM | POA: Insufficient documentation

## 2017-03-14 DIAGNOSIS — Z79899 Other long term (current) drug therapy: Secondary | ICD-10-CM | POA: Diagnosis not present

## 2017-03-14 DIAGNOSIS — S4991XA Unspecified injury of right shoulder and upper arm, initial encounter: Secondary | ICD-10-CM | POA: Diagnosis present

## 2017-03-14 DIAGNOSIS — F17210 Nicotine dependence, cigarettes, uncomplicated: Secondary | ICD-10-CM | POA: Insufficient documentation

## 2017-03-14 DIAGNOSIS — F319 Bipolar disorder, unspecified: Secondary | ICD-10-CM | POA: Diagnosis not present

## 2017-03-14 DIAGNOSIS — F309 Manic episode, unspecified: Secondary | ICD-10-CM

## 2017-03-14 DIAGNOSIS — Y9389 Activity, other specified: Secondary | ICD-10-CM | POA: Insufficient documentation

## 2017-03-14 LAB — BASIC METABOLIC PANEL
Anion gap: 7 (ref 5–15)
BUN: 8 mg/dL (ref 6–20)
CO2: 20 mmol/L — ABNORMAL LOW (ref 22–32)
Calcium: 8.4 mg/dL — ABNORMAL LOW (ref 8.9–10.3)
Chloride: 108 mmol/L (ref 101–111)
Creatinine, Ser: 0.93 mg/dL (ref 0.61–1.24)
GFR calc Af Amer: >60 mL/min (ref >60)
GFR calc non Af Amer: >60 mL/min (ref >60)
Glucose, Bld: 89 mg/dL (ref 65–99)
Potassium: 3.2 mmol/L — ABNORMAL LOW (ref 3.5–5.1)
Sodium: 135 mmol/L (ref 135–145)

## 2017-03-14 LAB — CBC WITH DIFFERENTIAL/PLATELET
Basophils Absolute: 0 10*3/uL (ref 0.0–0.1)
Basophils Relative: 0 %
Eosinophils Absolute: 0.2 10*3/uL (ref 0.0–0.7)
Eosinophils Relative: 2 %
HCT: 36.7 % — ABNORMAL LOW (ref 39.0–52.0)
Hemoglobin: 12.6 g/dL — ABNORMAL LOW (ref 13.0–17.0)
Lymphocytes Relative: 36 %
Lymphs Abs: 3.1 10*3/uL (ref 0.7–4.0)
MCH: 33.2 pg (ref 26.0–34.0)
MCHC: 34.3 g/dL (ref 30.0–36.0)
MCV: 96.6 fL (ref 78.0–100.0)
Monocytes Absolute: 0.6 10*3/uL (ref 0.1–1.0)
Monocytes Relative: 7 %
Neutro Abs: 4.9 10*3/uL (ref 1.7–7.7)
Neutrophils Relative %: 55 %
Platelets: 240 10*3/uL (ref 150–400)
RBC: 3.8 MIL/uL — ABNORMAL LOW (ref 4.22–5.81)
RDW: 13.4 % (ref 11.5–15.5)
WBC: 8.8 10*3/uL (ref 4.0–10.5)

## 2017-03-14 LAB — ETHANOL: Alcohol, Ethyl (B): <5 mg/dL (ref <5)

## 2017-03-14 NOTE — BH Assessment (Signed)
Tele Assessment Note   Alvin Allen is an 56 y.o. male accompanied by his wife to [facility] due to bizarre behaviors.  Alvin Allen presents with disorganized speech, labile mood, and delusional state of mind.  He jumps from topic to topic is very hyperactive and borderline offensive as evidenced by him snapping at his wife and violating her personal space during the interview.  He appears to be in a manic state.  He denies SI/HI and does not endorse current AVH.  His wife states he has only gotten at total of 12 hours of sleep within the last seven days.  He states his medication has been changed recently and contributes his behaviors to recent medication changes.  Pt resides with his wife and two sons. Pt can return home when discharged. Pt stated he has stressors of his son being a transgender and his other son being major depressive due to not being able to find a girl.  Pt also sts he is very lonely.  Pt admits to abusing marijuana and denies abusing alcohol.  He sts he had a concussion that resulted in him being on disability. He sts his divorce has caused him to be depressed due to not being able to do the "manly" things he once did.  Although, his wife states he can become intimidating he has not resorted to being aggressive.    Pt presented with a disheveled look.  He had poor eye contact and disorganized and loud speech. He had freedom of movement with exaggerated gestures.  He was alert, hyperactive, and restless. His mood was labile and bizarre.  His affect was congruent with his mood.  His thought content and thought process  was not logical.  His judgment was impaired, impulses poor, and insight poor.  He was oriented to people place time and situation.  Pt meets criteria for inpatient treatment and is recommended for inpatient treatment services per Nira Conn P.A.  Diagnosis: Bipolar I with psychotic feautures  Past Medical History:  Past Medical History:  Diagnosis Date  . Allergic  rhinitis   . Anemia   . Anxiety   . Arthritis   . Atypical chest pain   . Benign prostatic hypertrophy with urinary obstruction   . Bipolar disorder (HCC)   . Calculus of salivary duct   . Depression   . H/O: facial fractures 05/18/15  . Hard, firm prostate   . Hypokalemia   . Insomnia   . Multiple vitamin deficiency   . Nicotine dependence   . Obesity   . OSA (obstructive sleep apnea)    does not use cpap  . Pneumonia   . Prolonged QT interval   . Rhabdomyolysis 05/18/15   after fall  . Sialoadenitis   . Skull fracture with contusion (HCC) 05/18/15    Past Surgical History:  Procedure Laterality Date  . APPENDECTOMY  1980s  . biospy prostate    . COLONOSCOPY    . GASTRIC BYPASS  2006  . GASTRIC BYPASS    . KNEE ARTHROSCOPY  1995  . ORIF WRIST FRACTURE Right 05/30/2015   Procedure: OPEN REDUCTION INTERNAL FIXATION (ORIF) RIGHT WRIST FRACTURE;  Surgeon: Bradly Bienenstock, MD;  Location: [facility];  Service: Orthopedics;  Laterality: Right;  . rhinologic surgery    . TONSILLECTOMY AND ADENOIDECTOMY    . uvuloplasty      Family History:  Family History  Problem Relation Age of Onset  . Hyperlipidemia Mother   . Macular degeneration Father   . Heart  attack Maternal Grandfather     Social History:  reports that he has been smoking E-cigarettes and Cigarettes.  He has been smoking about 0.00 packs per day for the past 29.00 years. He has never used smokeless tobacco. He reports that he drinks alcohol. He reports that he does not use drugs.  Additional Social History:  Alcohol / Drug Use Pain Medications: See MAR Prescriptions: See MAR Over the Counter: See MAR Longest period of sobriety (when/how long): Pt sts he has been sober around spring time for about four weeks Negative Consequences of Use: Financial, Personal relationships, Work / School Substance #1 Name of Substance 1: Marijuanna 1 - Age of First Use: 22 1 - Amount (size/oz): UTA 1 - Frequency: daily 1 - Duration: 31  years totally 1 - Last Use / Amount: 03/14/2017 Substance #2 Name of Substance 2: Alcohol 2 - Age of First Use: 23 2 - Amount (size/oz): 1 drink per week 2 - Frequency: 1 time weekly 2 - Duration: 33 years 2 - Last Use / Amount: 03/13/2017  CIWA: CIWA-Ar BP: 103/74 Pulse Rate: 67 COWS:    PATIENT STRENGTHS: (choose at least two) Active sense of humor Average or above average intelligence Capable of independent living Communication skills Financial means Supportive family/friends  Allergies: No Known Allergies  Home Medications:  (Not in a hospital admission)  OB/GYN Status:  No LMP for male patient.  General Assessment Data [facility] Assessment: In system Is this a Tele or Face-to-Face Assessment?: Tele Assessment Is this an Initial Assessment or a Re-assessment for this encounter?: Initial Assessment Marital status: Married Living Arrangements: Spouse/significant other, Children Can pt return to current living arrangement?: Yes Admission Status: Voluntary Is patient capable of signing voluntary admission?: Yes Referral Source: Self/Family/Friend Insurance type: Scientist, research (physical sciences)BCBS  Medical Screening Exam [facility] Walk-in ONLY) Medical Exam completed: Yes  Crisis Care Plan Living Arrangements: Spouse/significant other, Children Legal Guardian: Other: (Self) Name of Psychiatrist: [facility] (Next appt is Sept 6) Name of Therapist: None  Education Status Is patient currently in school?: No Highest grade of school patient has completed: Some College Name of school: N/A Contact person: N/A  Risk to self with the past 6 months Suicidal Ideation: No Has patient been a risk to self within the past 6 months prior to admission? : Yes Suicidal Intent: No Has patient had any suicidal intent within the past 6 months prior to admission? : Yes Is patient at risk for suicide?: No Suicidal Plan?: No Has patient had any suicidal plan within the past 6  months prior to admission? : No Access to Means: No What has been your use of drugs/alcohol within the last 12 months?: marijuana and alcohol Previous Attempts/Gestures: Yes How many times?: 1 Other Self Harm Risks: using drugs Triggers for Past Attempts: Family contact (loss of father) Intentional Self Injurious Behavior: Burning (When using a cigarette he burns himself on purpose) Comment - Self Injurious Behavior: none Family Suicide History: No Recent stressful life event(s): Other (Comment), Turmoil (Comment), Job Loss (Son is going through depression) Persecutory voices/beliefs?: No Depression: Yes Depression Symptoms: Insomnia, Feeling worthless/self pity, Feeling angry/irritable, Guilt, Loss of interest in usual pleasures, Isolating Substance abuse history and/or treatment for substance abuse?: Yes Suicide prevention information given to non-admitted patients: Not applicable  Risk to Others within the past 6 months Homicidal Ideation: No Does patient have any lifetime risk of violence toward others beyond the six months prior to admission? : No Thoughts of  Harm to Others: No Current Homicidal Intent: No Current Homicidal Plan: No Access to Homicidal Means: No Identified Victim: N/A History of harm to others?: No Assessment of Violence: None Noted Violent Behavior Description: Wife sts he sometimes violate personal space; has not hit anyone Does patient have access to weapons?: No Criminal Charges Pending?: No Describe Pending Criminal Charges: None reported Does patient have a court date: No Is patient on probation?: No  Psychosis Hallucinations: None noted Delusions: None noted  Mental Status Report Appearance/Hygiene: Unremarkable, Disheveled Eye Contact: Poor Motor Activity: Freedom of movement, Gait exaggerated, Hyperactivity Speech: Logical/coherent, Loud Level of Consciousness: Alert, Restless Mood: Threatening, Labile Affect: Anxious, Preoccupied Anxiety  Level: Moderate Thought Processes: Coherent Judgement: Impaired Orientation: Person, Place, Time, Situation Obsessive Compulsive Thoughts/Behaviors: None  Cognitive Functioning Concentration: Poor Memory: Recent Intact, Remote Intact IQ: Average Insight: Poor Impulse Control: Poor Appetite: Poor Weight Loss: 0 Weight Gain: 0 Sleep: Decreased Total Hours of Sleep: 1 Vegetative Symptoms: None  ADLScreening [facility] Assessment Services) Patient's cognitive ability adequate to safely complete daily activities?: Yes Patient able to express need for assistance with ADLs?: Yes Independently performs ADLs?: Yes (appropriate for developmental age)  Prior Inpatient Therapy Prior Inpatient Therapy: Yes Prior Therapy Dates: 2016 and 2017 Prior Therapy Facilty/Provider(s): [facility] Reason for Treatment: Bipolar I Disorder  Prior Outpatient Therapy Prior Outpatient Therapy: No Prior Therapy Dates: None Prior Therapy Facilty/Provider(s): None Reason for Treatment: None Does patient have an ACCT team?: No Does patient have Intensive In-House Services?  : No Does patient have [facility] services? : No Does patient have P4CC services?: No  ADL Screening (condition at time of admission) Patient's cognitive ability adequate to safely complete daily activities?: Yes Patient able to express need for assistance with ADLs?: Yes Independently performs ADLs?: Yes (appropriate for developmental age)       Abuse/Neglect Assessment (Assessment to be complete while patient is alone) Physical Abuse: Denies Verbal Abuse: Yes, past (Comment) (Pt thinks it is normal to be verbally abused) Sexual Abuse: Denies Exploitation of patient/patient's resources: Denies Self-Neglect: Denies Values / Beliefs Cultural Requests During Hospitalization: None Spiritual Requests During Hospitalization: None Consults Social Work Consult Needed: No Merchant navy officer (For Healthcare) Does  Patient Have a Programmer, multimedia?: Yes Does patient want to make changes to medical advance directive?: Yes (ED - Information included in AVS) Type of Advance Directive: Healthcare Power of [employer] of Healthcare Power of Attorney in Chart?: Yes    Additional Information 1:1 In Past 12 Months?: No CIRT Risk: No Elopement Risk: No Does patient have medical clearance?: Yes     Disposition:  Disposition Initial Assessment Completed for this Encounter: Yes Disposition of Patient: Inpatient treatment program (Per Nira Conn, PA) Type of inpatient treatment program: Adult  Gillermina Hu 03/14/2017 11:45 PM
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
## 2017-03-14 NOTE — ED Notes (Signed)
TTS in room.  
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
## 2017-03-14 NOTE — Discharge Instructions (Signed)
Return here as needed. Use ice and heat on the area. The x-rays were normal

## 2017-03-15 LAB — RAPID URINE DRUG SCREEN, HOSP PERFORMED
Amphetamines: NOT DETECTED
Barbiturates: NOT DETECTED
Benzodiazepines: NOT DETECTED
Cocaine: NOT DETECTED
Opiates: NOT DETECTED
Tetrahydrocannabinol: NOT DETECTED

## 2017-03-15 MED ORDER — CLONAZEPAM 0.5 MG PO TABS
0.2500 mg | ORAL_TABLET | Freq: Every day | ORAL | Status: DC | PRN
  Administered 2017-03-15: 0.25 mg via ORAL
  Filled 2017-03-15: qty 1

## 2017-03-15 MED ORDER — LORAZEPAM 2 MG/ML IJ SOLN
1.0000 mg | Freq: Once | INTRAMUSCULAR | Status: AC
  Administered 2017-03-15: 1 mg via INTRAMUSCULAR
  Filled 2017-03-15: qty 1

## 2017-03-15 MED ORDER — MIRTAZAPINE 15 MG PO TABS
15.0000 mg | ORAL_TABLET | Freq: Every day | ORAL | Status: DC
  Filled 2017-03-15: qty 1

## 2017-03-15 MED ORDER — DIPHENHYDRAMINE HCL 25 MG PO CAPS
50.0000 mg | ORAL_CAPSULE | Freq: Once | ORAL | Status: AC
  Administered 2017-03-15: 50 mg via ORAL
  Filled 2017-03-15: qty 2

## 2017-03-15 MED ORDER — ARIPIPRAZOLE 10 MG PO TABS
15.0000 mg | ORAL_TABLET | Freq: Every day | ORAL | Status: DC
  Administered 2017-03-15: 15 mg via ORAL
  Filled 2017-03-15: qty 2

## 2017-03-15 NOTE — BHH Counselor (Signed)
Pt accepted [facility].  Please call report to [phone].  Admitting is Norville HaggardAudrey Wisner, NP.  Pt may arrive at any time 03/15/17.

## 2017-03-15 NOTE — ED Notes (Signed)
Pt eating breakfast 

## 2017-03-15 NOTE — ED Notes (Signed)
Wife at bedside.

## 2017-03-15 NOTE — ED Notes (Signed)
[facility]

## 2017-03-15 NOTE — ED Notes (Signed)
Pt will not stop messing with equipment in room. When given direction he will follow but has a liable mood and is easily agitated. RN had a conversation with pt and he agreed to take medication to help him rest. MD notified and orders placed

## 2017-03-15 NOTE — Progress Notes (Addendum)
Patient was reassessed this morning and presented with manic state.  Patient was recommended inpatient treatment by Elta GuadeloupeLaurie Parks NP and referral packet has been sent to psych IP treatment facilities. Patient is currently being reviewed for [facility] at: [address] [facility], [facility], [address], [facility], and [facility].  CSW in disposition will continue to follow up with placement efforts. Melbourne Abtsatia Rayven Rettig, LCSWA Disposition staff 03/15/2017 11:05 AM

## 2017-03-15 NOTE — Progress Notes (Addendum)
Two hospitals accepted patient for inpatient [facility] treatment: [name] and [name]. Patient to talk to his wife about placement.  Alvin Allen, LCSWA Disposition staff 03/15/2017 12:08 PM

## 2017-03-15 NOTE — ED Notes (Signed)
ED Provider at bedside. 

## 2017-03-15 NOTE — Progress Notes (Signed)
Patient has been recommended inpatient treatment by Nira ConnJason Berry P.A. on 03/14/17.  Patient has been referred to the following inpatient treatment facilities: [facility], [facility], [address], [facility], and [facility].  At capacity: [facility], [facility], [facility], [facility], [facility], [facility], [facility].  Left voicemail with [facility] inquiring about bed status and requested call back, phone number provided.  CSW in disposition will continue to seek placement.  Melbourne Abtsatia Yakub Lodes, LCSWA Disposition staff 03/15/2017 10:02 AM

## 2017-03-15 NOTE — ED Notes (Signed)
Per ACT team pt needs to be IVC's in order to get a bed at another facility due to psychosis behavior. Dr Eudelia Bunchardama made aware.

## 2017-03-15 NOTE — ED Notes (Signed)
Wife cell: [phone]

## 2017-03-15 NOTE — ED Notes (Signed)
Pt. In hallway upset that the IV pole is attached to his bed. Pt. Screaming ' things don't hurt people. Things love people'.

## 2017-03-15 NOTE — ED Provider Notes (Signed)
Pt accepted [facility].  Admitting is Norville HaggardAudrey Wisner, NP.   Reevaluated the patient prior to transfer. Stable for transfer.   Nira Connardama, Pedro Eduardo, MD 03/15/17 1540

## 2017-03-15 NOTE — ED Notes (Signed)
Pt speaking with wife about plan of care being [facility]. Facility willing to accept pt on voluntary basis if pt is willing to go. Pt reports he is not capable of making impulsive descions and that we would need to speak with his wife. Wife agrees that [facility].

## 2017-03-15 NOTE — ED Notes (Signed)
[facility] contacted and stated they have a driver taking a patient there at this time. [facility] to arrive around 4pm to transport.

## 2017-03-15 NOTE — BHH Counselor (Signed)
Pt presented to ED on 03/14/17 in manic state.  Client was reassessed this morning.  He admitted that he has Bipolar I.  When asked about sleep, Pt became irritable, stated that he slept fine and he intended to sleep tonight.  When asked if Pt was taking any medication, Pt became exasperated, stated that he would no longer discuss spiritual matters with Pt.  Recommend continued inpatient placement.

## 2017-03-15 NOTE — Progress Notes (Addendum)
12:17pm Patient declined bed [facility]. Writer informed Kristi at [facility] that pt declined bed.   11:50am Patient has been accepted to [facility] to Dr. Nila Nephewhomas Cornall, bed will be ready tomorrow, please ask the patient if he will go voluntarily and sign himself in.   RN call report at [phone]. [facility] RN Kisha aware.  Per [facility] RN Bradly BienenstockKisha, patient to discuss with his wife if she is agreeable with going to [facility].  Writer awaiting call back at [phone] about patient's decision with regards to his status for [facility].  [place] Abtsatia Julias Mould, LCSWA Disposition staff, [facility], TTS Office [phone] 03/15/2017 11:54 AM

## 2017-03-15 NOTE — Progress Notes (Addendum)
Two hospitals accepted patient for inpatient psych treatment[facility].  Patient decided that he wants to go voluntarily [facility]. Dr. Laveda Abbehomas Sneed is attending provider. Admitting is Norville HaggardAudrey Wisner, NP. Please call report to [phone]. Bed is ready any time now. [facility] RN Teyllor aware of accepting information. Kisha at [facility] was notified that patient declined bed.  Melbourne Abtsatia Zaleigh Bermingham, LCSWA Disposition staff 03/15/2017 12:10 PM
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
## 2017-05-01 ENCOUNTER — Emergency Department (HOSPITAL_COMMUNITY): Payer: BC Managed Care – PPO

## 2017-05-01 DIAGNOSIS — R410 Disorientation, unspecified: Secondary | ICD-10-CM | POA: Diagnosis not present

## 2017-05-01 DIAGNOSIS — Z5321 Procedure and treatment not carried out due to patient leaving prior to being seen by health care provider: Secondary | ICD-10-CM | POA: Insufficient documentation

## 2017-05-01 NOTE — ED Triage Notes (Addendum)
Per EMS, pt from home, pt's wife reports pt woke up tonight from his nap disoriented and with his eyes crossed.  Per EMS, pt's L eye is slightly drawn in but is corrected once he wears his glasses.  Pt is A&O per norm.  Pt's wife at bedside.  Denies recent head injury.  [facility]  Has hx of eye problems in the past.

## 2017-05-01 NOTE — ED Triage Notes (Signed)
After further assessment, pt reports hitting his forehead on a door while he was at the psych facility Wednesday.

## 2017-05-02 ENCOUNTER — Emergency Department (HOSPITAL_COMMUNITY)
Admission: EM | Admit: 2017-05-02 | Discharge: 2017-05-02 | Disposition: A | Discharge status: Home / Self Care | Payer: BC Managed Care – PPO | Attending: Emergency Medicine | Admitting: Emergency Medicine

## 2017-05-02 NOTE — ED Notes (Signed)
Pt called for room for 2nd time, no response 

## 2017-05-02 NOTE — ED Notes (Addendum)
Went to get pt for room assignment, no one in lobby responded to name

## 2017-05-27 ENCOUNTER — Other Ambulatory Visit (HOSPITAL_COMMUNITY): Payer: BC Managed Care – PPO | Attending: Psychiatry | Admitting: Licensed Clinical Social Worker

## 2017-05-27 DIAGNOSIS — F319 Bipolar disorder, unspecified: Secondary | ICD-10-CM
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
